# Patient Record
Sex: Female | Born: 1970 | Race: Black or African American | Hispanic: No | Marital: Single | Smoking: Former smoker
Health system: Southern US, Community
[De-identification: ages and names within clinical notes are randomized; demographics above are authoritative.]

## PROBLEM LIST (undated history)

## (undated) DIAGNOSIS — K8020 Calculus of gallbladder without cholecystitis without obstruction: Secondary | ICD-10-CM

## (undated) DIAGNOSIS — I10 Essential (primary) hypertension: Secondary | ICD-10-CM

## (undated) DIAGNOSIS — G4700 Insomnia, unspecified: Secondary | ICD-10-CM

## (undated) DIAGNOSIS — F329 Major depressive disorder, single episode, unspecified: Secondary | ICD-10-CM

## (undated) DIAGNOSIS — K589 Irritable bowel syndrome without diarrhea: Secondary | ICD-10-CM

## (undated) DIAGNOSIS — M1990 Unspecified osteoarthritis, unspecified site: Secondary | ICD-10-CM

## (undated) DIAGNOSIS — N943 Premenstrual tension syndrome: Secondary | ICD-10-CM

## (undated) DIAGNOSIS — G43909 Migraine, unspecified, not intractable, without status migrainosus: Secondary | ICD-10-CM

## (undated) DIAGNOSIS — J189 Pneumonia, unspecified organism: Secondary | ICD-10-CM

## (undated) DIAGNOSIS — Z22322 Carrier or suspected carrier of Methicillin resistant Staphylococcus aureus: Secondary | ICD-10-CM

## (undated) DIAGNOSIS — F419 Anxiety disorder, unspecified: Secondary | ICD-10-CM

## (undated) DIAGNOSIS — K219 Gastro-esophageal reflux disease without esophagitis: Secondary | ICD-10-CM

## (undated) DIAGNOSIS — E559 Vitamin D deficiency, unspecified: Secondary | ICD-10-CM

## (undated) DIAGNOSIS — A6009 Herpesviral infection of other urogenital tract: Secondary | ICD-10-CM

## (undated) DIAGNOSIS — T7840XA Allergy, unspecified, initial encounter: Secondary | ICD-10-CM

## (undated) DIAGNOSIS — D219 Benign neoplasm of connective and other soft tissue, unspecified: Secondary | ICD-10-CM

## (undated) DIAGNOSIS — E010 Iodine-deficiency related diffuse (endemic) goiter: Secondary | ICD-10-CM

## (undated) DIAGNOSIS — R0602 Shortness of breath: Secondary | ICD-10-CM

## (undated) DIAGNOSIS — D649 Anemia, unspecified: Secondary | ICD-10-CM

## (undated) DIAGNOSIS — T8859XA Other complications of anesthesia, initial encounter: Secondary | ICD-10-CM

## (undated) DIAGNOSIS — Z8619 Personal history of other infectious and parasitic diseases: Secondary | ICD-10-CM

## (undated) DIAGNOSIS — D869 Sarcoidosis, unspecified: Secondary | ICD-10-CM

## (undated) DIAGNOSIS — F32A Depression, unspecified: Secondary | ICD-10-CM

## (undated) DIAGNOSIS — M25561 Pain in right knee: Secondary | ICD-10-CM

## (undated) DIAGNOSIS — I209 Angina pectoris, unspecified: Secondary | ICD-10-CM

## (undated) DIAGNOSIS — M25562 Pain in left knee: Secondary | ICD-10-CM

## (undated) DIAGNOSIS — T4145XA Adverse effect of unspecified anesthetic, initial encounter: Secondary | ICD-10-CM

## (undated) HISTORY — DX: Migraine, unspecified, not intractable, without status migrainosus: G43.909

## (undated) HISTORY — DX: Herpesviral infection of other urogenital tract: A60.09

## (undated) HISTORY — DX: Personal history of other infectious and parasitic diseases: Z86.19

## (undated) HISTORY — DX: Insomnia, unspecified: G47.00

## (undated) HISTORY — DX: Carrier or suspected carrier of methicillin resistant Staphylococcus aureus: Z22.322

## (undated) HISTORY — DX: Iodine-deficiency related diffuse (endemic) goiter: E01.0

## (undated) HISTORY — DX: Irritable bowel syndrome, unspecified: K58.9

## (undated) HISTORY — DX: Benign neoplasm of connective and other soft tissue, unspecified: D21.9

## (undated) HISTORY — DX: Allergy, unspecified, initial encounter: T78.40XA

## (undated) HISTORY — PX: IUD REMOVAL: SHX5392

## (undated) HISTORY — DX: Anemia, unspecified: D64.9

## (undated) HISTORY — DX: Vitamin D deficiency, unspecified: E55.9

## (undated) HISTORY — PX: DIAGNOSTIC LAPAROSCOPY: SUR761

## (undated) HISTORY — DX: Premenstrual tension syndrome: N94.3

## (undated) HISTORY — PX: BLADDER SURGERY: SHX569

## (undated) HISTORY — DX: Anxiety disorder, unspecified: F41.9

## (undated) HISTORY — DX: Gastro-esophageal reflux disease without esophagitis: K21.9

## (undated) HISTORY — PX: WISDOM TOOTH EXTRACTION: SHX21

---

## 1999-01-10 ENCOUNTER — Other Ambulatory Visit: Admission: RE | Admit: 1999-01-10 | Discharge: 1999-01-10 | Payer: Self-pay | Admitting: Family Medicine

## 1999-11-12 ENCOUNTER — Emergency Department (HOSPITAL_COMMUNITY): Admission: EM | Admit: 1999-11-12 | Discharge: 1999-11-12 | Payer: Self-pay | Admitting: Emergency Medicine

## 1999-11-28 ENCOUNTER — Other Ambulatory Visit: Admission: RE | Admit: 1999-11-28 | Discharge: 1999-11-28 | Payer: Self-pay | Admitting: *Deleted

## 2000-11-27 ENCOUNTER — Other Ambulatory Visit: Admission: RE | Admit: 2000-11-27 | Discharge: 2000-11-27 | Payer: Self-pay | Admitting: Obstetrics and Gynecology

## 2001-07-05 ENCOUNTER — Encounter: Payer: Self-pay | Admitting: Gastroenterology

## 2001-07-05 ENCOUNTER — Encounter: Admission: RE | Admit: 2001-07-05 | Discharge: 2001-07-05 | Payer: Self-pay | Admitting: Gastroenterology

## 2001-10-01 ENCOUNTER — Emergency Department (HOSPITAL_COMMUNITY): Admission: EM | Admit: 2001-10-01 | Discharge: 2001-10-01 | Payer: Self-pay | Admitting: Emergency Medicine

## 2001-12-27 ENCOUNTER — Other Ambulatory Visit: Admission: RE | Admit: 2001-12-27 | Discharge: 2001-12-27 | Payer: Self-pay | Admitting: Obstetrics and Gynecology

## 2002-12-30 ENCOUNTER — Other Ambulatory Visit: Admission: RE | Admit: 2002-12-30 | Discharge: 2002-12-30 | Payer: Self-pay | Admitting: Obstetrics and Gynecology

## 2004-01-02 ENCOUNTER — Other Ambulatory Visit: Admission: RE | Admit: 2004-01-02 | Discharge: 2004-01-02 | Payer: Self-pay | Admitting: Obstetrics and Gynecology

## 2005-01-01 ENCOUNTER — Other Ambulatory Visit: Admission: RE | Admit: 2005-01-01 | Discharge: 2005-01-01 | Payer: Self-pay | Admitting: Obstetrics and Gynecology

## 2005-10-15 ENCOUNTER — Ambulatory Visit (HOSPITAL_COMMUNITY): Admission: RE | Admit: 2005-10-15 | Discharge: 2005-10-15 | Payer: Self-pay | Admitting: *Deleted

## 2006-01-01 ENCOUNTER — Other Ambulatory Visit: Admission: RE | Admit: 2006-01-01 | Discharge: 2006-01-01 | Payer: Self-pay | Admitting: Obstetrics and Gynecology

## 2006-07-28 ENCOUNTER — Emergency Department (HOSPITAL_COMMUNITY): Admission: EM | Admit: 2006-07-28 | Discharge: 2006-07-28 | Payer: Self-pay | Admitting: Emergency Medicine

## 2007-02-03 ENCOUNTER — Encounter: Admission: RE | Admit: 2007-02-03 | Discharge: 2007-02-03 | Payer: Self-pay | Admitting: Gastroenterology

## 2007-07-01 ENCOUNTER — Encounter: Admission: RE | Admit: 2007-07-01 | Discharge: 2007-07-01 | Payer: Self-pay | Admitting: Family Medicine

## 2010-12-06 NOTE — Op Note (Signed)
Yvonne Banks, Yvonne Banks             ACCOUNT NO.:  [id number]   MEDICAL RECORD NO.:  [id number]          PATIENT TYPE:  AMB   LOCATION:  ENDO                         FACILITY:  [facility]   PHYSICIAN:  Georgiana Spinner, M.D.    DATE OF BIRTH:  [birth year]   DATE OF PROCEDURE:  10/15/2005  DATE OF DISCHARGE:                                 OPERATIVE REPORT   PROCEDURES:  Upper endoscopy.   ENDOSCOPIST:  Georgiana Spinner, M.D.   INDICATIONS:  Gastroesophageal reflux disease.   ANESTHESIA:  Fentanyl 100 mcg, Versed 9 mg.   PROCEDURE IN DETAIL:  With the patient mildly sedated in the left lateral  decubitus position, the Olympus videoscopic endoscope was inserted in the  mouth and passed under direct vision into the esophagus which appeared  normal.  There was no evidence of Barrett's esophagus.  We entered into the  stomach, fundus, body, antrum, duodenal bulb, and second portion of the  duodenum were visualized.  From this point, the endoscope was slowly  withdrawn taking circumferential views of the duodenal mucosa until the  endoscope had been pulled back into the stomach and placed in retroflexion  to view the stomach from below.  The endoscope was straightened and  withdrawn taking circumferential views of the remaining gastric and  esophageal mucosa.  The patient's vital signs and pulse oximetry remained  stable.  The patient tolerated procedure well without apparent  complications.   FINDINGS:  Unremarkable examination.   PLAN:  Have the patient follow-up with me as needed.           ______________________________  Georgiana Spinner, M.D.     GMO/MEDQ  D:  10/15/2005  T:  10/16/2005  Job:  [id number]   cc:   Jethro Bastos, M.D.  Fax: [phone]

## 2011-03-20 ENCOUNTER — Other Ambulatory Visit: Payer: Self-pay | Admitting: Obstetrics and Gynecology

## 2011-03-20 DIAGNOSIS — N644 Mastodynia: Secondary | ICD-10-CM

## 2011-03-26 ENCOUNTER — Ambulatory Visit
Admission: RE | Admit: 2011-03-26 | Discharge: 2011-03-26 | Disposition: A | Discharge status: Home / Self Care | Payer: 59 | Source: Ambulatory Visit | Attending: Obstetrics and Gynecology | Admitting: Obstetrics and Gynecology

## 2011-03-26 DIAGNOSIS — N644 Mastodynia: Secondary | ICD-10-CM

## 2012-01-27 ENCOUNTER — Other Ambulatory Visit: Payer: Self-pay | Admitting: Family Medicine

## 2012-01-27 ENCOUNTER — Ambulatory Visit
Admission: RE | Admit: 2012-01-27 | Discharge: 2012-01-27 | Disposition: A | Discharge status: Home / Self Care | Payer: 59 | Source: Ambulatory Visit | Attending: Family Medicine | Admitting: Family Medicine

## 2012-01-27 DIAGNOSIS — J45909 Unspecified asthma, uncomplicated: Secondary | ICD-10-CM

## 2012-03-11 ENCOUNTER — Institutional Professional Consult (permissible substitution): Payer: 59 | Admitting: Emergency Medicine

## 2012-03-12 ENCOUNTER — Telehealth: Payer: Self-pay | Admitting: Obstetrics and Gynecology

## 2012-03-12 NOTE — Telephone Encounter (Signed)
Tc to pt regarding faxed rf request for pt's bcp.  Pt states to disregard because she gets her rx's from her family doctor and she will be getting her AEXs from her family doctor as well.

## 2012-06-15 ENCOUNTER — Other Ambulatory Visit: Payer: Self-pay | Admitting: Family Medicine

## 2012-06-15 ENCOUNTER — Other Ambulatory Visit (HOSPITAL_COMMUNITY)
Admission: RE | Admit: 2012-06-15 | Discharge: 2012-06-15 | Disposition: A | Discharge status: Home / Self Care | Payer: 59 | Source: Ambulatory Visit | Attending: Family Medicine | Admitting: Family Medicine

## 2012-06-15 DIAGNOSIS — Z124 Encounter for screening for malignant neoplasm of cervix: Secondary | ICD-10-CM | POA: Insufficient documentation

## 2012-06-15 DIAGNOSIS — Z1151 Encounter for screening for human papillomavirus (HPV): Secondary | ICD-10-CM | POA: Insufficient documentation

## 2012-06-28 ENCOUNTER — Ambulatory Visit (INDEPENDENT_AMBULATORY_CARE_PROVIDER_SITE_OTHER): Payer: 59 | Admitting: Infectious Diseases

## 2012-06-28 ENCOUNTER — Encounter: Payer: Self-pay | Admitting: Infectious Diseases

## 2012-06-28 VITALS — Temp 98.4°F | Ht 65.0 in | Wt 242.0 lb

## 2012-06-28 DIAGNOSIS — A4902 Methicillin resistant Staphylococcus aureus infection, unspecified site: Secondary | ICD-10-CM | POA: Insufficient documentation

## 2012-06-28 DIAGNOSIS — B009 Herpesviral infection, unspecified: Secondary | ICD-10-CM

## 2012-06-28 DIAGNOSIS — A609 Anogenital herpesviral infection, unspecified: Secondary | ICD-10-CM | POA: Insufficient documentation

## 2012-06-28 LAB — CBC WITH DIFFERENTIAL/PLATELET
Basophils Relative: 0 % (ref 0–1)
Eosinophils Absolute: 0.1 10*3/uL (ref 0.0–0.7)
HCT: 35.8 % — ABNORMAL LOW (ref 36.0–46.0)
Hemoglobin: 11.5 g/dL — ABNORMAL LOW (ref 12.0–15.0)
MCH: 25.1 pg — ABNORMAL LOW (ref 26.0–34.0)
MCHC: 32.1 g/dL (ref 30.0–36.0)
Monocytes Absolute: 0.4 10*3/uL (ref 0.1–1.0)
Monocytes Relative: 5 % (ref 3–12)

## 2012-06-28 MED ORDER — DOXYCYCLINE HYCLATE 100 MG PO CAPS: 100.0000 mg | ORAL_CAPSULE | Freq: Two times a day (BID) | ORAL | Status: DC

## 2012-06-28 NOTE — Assessment & Plan Note (Addendum)
She is doing well. Spoke with her at length re: keeping wounds clean and covered (to be non-contagious), wash clothes in hot water, hand washing when at work/around family, do not share towels or linens. Ok for her to shave  I will continue her doxy for an additional week. Have asked her to use the CHG only weekly and to use the mupirocin for the first 5 days of the month only.  Will see her back in 4-6 weeks. If she continues to have problems could continue suppressive anbx?

## 2012-06-28 NOTE — Progress Notes (Signed)
  Subjective:    Patient ID: Yvonne Banks, female    DOB: Feb 12, 1971, 41 y.o.   MRN: [id number]  HPI 41 yo F with hx of IBS, depression, and genital herpes. Was seen 11-26, 9-6 and 8-5 with MRSA episodes. Cx done 8-7 showed MRSA (s- clinda, gent, tet, bactrim, rif, vanco).  Had her first episode after having gone on a cruise in June. First episode was on vaginal wall. Took 10 days of bactrim and next episode started almost immediately. This was an axillary boil. Currently is on doxy (started on 11-21). Has knots in her groin area.  Is using mupirocin- every night for last several months. Does CHG bath daily for last 3 months as well.   Review of Systems  Constitutional: Positive for fever and fatigue. Negative for chills, appetite change and unexpected weight change (lost wt, was on wt watchers).  Gastrointestinal: Positive for diarrhea. Negative for constipation.  Genitourinary: Negative for vaginal discharge and difficulty urinating.       Objective:   Physical Exam  Constitutional: She appears well-developed and well-nourished.  HENT:  Mouth/Throat: No oropharyngeal exudate.  Eyes: EOM are normal. Pupils are equal, round, and reactive to light.  Neck: Neck supple.  Cardiovascular: Normal rate and regular rhythm.   Pulmonary/Chest: Effort normal and breath sounds normal.  Abdominal: Soft. Bowel sounds are normal. There is no tenderness. There is no rebound.  Genitourinary:     Lymphadenopathy:    She has no cervical adenopathy.          Assessment & Plan:

## 2012-06-28 NOTE — Assessment & Plan Note (Signed)
Advised her to continue taking ACV prn. Could switch to valtrex (mostly for convenience). She gets yearly HIV test, has not had this year, will get today.

## 2012-06-28 NOTE — Addendum Note (Signed)
Addended by: Andree Coss on: 06/28/2012 11:59 AM   Modules accepted: Orders

## 2012-08-23 ENCOUNTER — Ambulatory Visit: Payer: 59 | Admitting: Infectious Diseases

## 2012-08-31 ENCOUNTER — Telehealth: Payer: Self-pay | Admitting: Licensed Clinical Social Worker

## 2012-08-31 NOTE — Telephone Encounter (Signed)
Ok to fill, probably needs f/u appt thanks

## 2012-08-31 NOTE — Telephone Encounter (Signed)
Patient finished a month of Doxycycline at the end of December, was fine the whole month of January and now has a new lesion in her vaginal area. The patient states there is a refill of doxcycline at her pharmacy should she pick that up or make an appointment?

## 2012-08-31 NOTE — Telephone Encounter (Signed)
Patient notified.     Thank you!

## 2012-09-03 ENCOUNTER — Encounter: Payer: Self-pay | Admitting: Obstetrics and Gynecology

## 2012-09-04 ENCOUNTER — Other Ambulatory Visit: Payer: Self-pay

## 2012-09-06 ENCOUNTER — Ambulatory Visit: Payer: 59 | Admitting: Obstetrics and Gynecology

## 2012-09-06 ENCOUNTER — Encounter: Payer: Self-pay | Admitting: Obstetrics and Gynecology

## 2012-09-06 VITALS — BP 120/78 | Ht 65.0 in | Wt 248.0 lb

## 2012-09-06 DIAGNOSIS — D219 Benign neoplasm of connective and other soft tissue, unspecified: Secondary | ICD-10-CM

## 2012-09-06 DIAGNOSIS — N926 Irregular menstruation, unspecified: Secondary | ICD-10-CM

## 2012-09-06 DIAGNOSIS — Z3009 Encounter for other general counseling and advice on contraception: Secondary | ICD-10-CM

## 2012-09-06 DIAGNOSIS — G43909 Migraine, unspecified, not intractable, without status migrainosus: Secondary | ICD-10-CM

## 2012-09-06 MED ORDER — MEDROXYPROGESTERONE ACETATE 150 MG/ML IM SUSP
150.0000 mg | Freq: Once | INTRAMUSCULAR | Status: AC
  Administered 2012-09-06: 150 mg via INTRAMUSCULAR

## 2012-09-06 NOTE — Progress Notes (Signed)
When did bleeding start: 05/2011 How  Long: 3 months How often changing pad/tampon: twice a day Bleeding Disorders: no Cramping: yes Contraception: yes Fibroids: no Hormone Therapy: no New Medications: yes Menopausal Symptoms: no Vag. Discharge: no Abdominal Pain: yes Increased Stress: yes  Pt states stopped depo provera due to prolonged bleeding

## 2012-09-06 NOTE — Progress Notes (Signed)
41 YO previously on Depo Provera complains of irregular bleeding.  Last injection was November 2013.  Has been bleeding since the end of that month with tampon change twice a day, headaches, nausea and cramps.  Has "sweats" that are more profuse than in the past.and has episodes of feeling flushed with nausea that is relieved with exposure to cold-occurs several times a month and will resolve in about 30 minutes.   Seemingly occurs around mid-afternoon. Denies any bowel/bladder symptoms, vomiting, fever,  but admits to being lightheaded sometimes.  Consumes very little liquids during the day.and has considerable carbs.  Has a history of anemia (on Integra),  vitamin D deficiency (taking 2000 IU daiily), on chronic antibiotics for chronic MRSA   O: Abdomen: soft, non-tender        Pelvic: EGBUS- wnl, vagina-normal, cervix-pin-point sized polyp-cauterized with silver nitrate, no tenderness, uterus-appears ULNS, tender (patient com-       plained of cramping after exam), adnexae-no tenderness or masses  UPT-negative   A: Irregular Bleeding     Pelvic Pain     H/O Fibroids   P:  Increase liquids and protein       Pelvic Ultrasound to evaluation of fibroids       ROI-recent labs from Dr. Paulino Rily       Depo Provera 150 mg IM       RTO-as scheduled or prn        Rashena Dowling, PA-C

## 2012-09-07 DIAGNOSIS — G43909 Migraine, unspecified, not intractable, without status migrainosus: Secondary | ICD-10-CM | POA: Insufficient documentation

## 2012-09-15 ENCOUNTER — Ambulatory Visit: Payer: 59

## 2012-09-15 ENCOUNTER — Ambulatory Visit: Payer: 59 | Admitting: Obstetrics and Gynecology

## 2012-09-15 ENCOUNTER — Encounter: Payer: Self-pay | Admitting: Obstetrics and Gynecology

## 2012-09-15 VITALS — BP 116/70 | HR 74 | Wt 246.0 lb

## 2012-09-15 DIAGNOSIS — N926 Irregular menstruation, unspecified: Secondary | ICD-10-CM

## 2012-09-15 DIAGNOSIS — N946 Dysmenorrhea, unspecified: Secondary | ICD-10-CM

## 2012-09-15 DIAGNOSIS — D219 Benign neoplasm of connective and other soft tissue, unspecified: Secondary | ICD-10-CM

## 2012-09-15 MED ORDER — KETOROLAC TROMETHAMINE 10 MG PO TABS: 10.0000 mg | ORAL_TABLET | Freq: Four times a day (QID) | ORAL | Status: DC | PRN

## 2012-09-15 NOTE — Progress Notes (Signed)
41 YO seen previously for irregular bleeding and moderately severe cramping that is not relieved with OTC analgesia, returns for ultrasound follow up.  Patient knows of a history of fibroids and is on Depo Provera but bleeding is out of proportion to her usual bleeding.    O: U/S: uterus-8.51 x 8.72 x 7.10 cm, endometrium-0.632 cm, right ovary-2.84 x 2.40 x 1.92 cm and left ovary-2.79 x 1.90 x 1.36 cm; Left anterior intramural fibroid: 3.1 x 2.7 x 2.3 cm and Mildline fibroid ? submucosal 3.4 x 2.7 x 3.1 cm seen in 3D imaging; uterus has diffuse fibroid involvement.  A:: Uterine Fibroids      Irregular Bleeding      Severe Dysmenorrhea  P: Revewed management options:  BCPs, estradiol while on Depo Provera, hysteroscopic removal of fibroid, Mirena IUD (patient wants to preserve     childbearing potential       Schedule with Dr. Su Hilt for possible hysteroscopic removal of submucosal fibroid      Reviewed SHG as a possible pre-surgery requirement      Toradol 10 mg #20 1 po pc q 6 hours prn-pain      RTO-as scheduled or prn  Keylen Uzelac,PA-C

## 2012-09-15 NOTE — Patient Instructions (Addendum)
Fibroids Fibroids are lumps (tumors) that can occur any place in a woman's body. These lumps are not cancerous. Fibroids vary in size, weight, and where they grow. HOME CARE  Do not take aspirin.  Write down the number of pads or tampons you use during your period. Tell your doctor. This can help determine the best treatment for you. GET HELP RIGHT AWAY IF:  You have pain in your lower belly (abdomen) that is not helped with medicine.  You have cramps that are not helped with medicine.  You have more bleeding between or during your period.  You feel lightheaded or pass out (faint).  Your lower belly pain gets worse. MAKE SURE YOU:  Understand these instructions.  Will watch your condition.  Will get help right away if you are not doing well or get worse. Document Released: 08/09/2010 Document Revised: 09/29/2011 Document Reviewed: 08/09/2010 [facility] Patient Information 2013 [place], [place].   Hysteroscopy Hysteroscopy is a procedure used for looking inside the womb (uterus). It may be done for many different reasons, including:  To evaluate abnormal bleeding, fibroid (benign, noncancerous) tumors, polyps, scar tissue (adhesions), and possibly cancer of the uterus.  To look for lumps (tumors) and other uterine growths.  To look for causes of why a woman cannot get pregnant (infertility), causes of recurrent loss of pregnancy (miscarriages), or a lost intrauterine device (IUD).  To perform a sterilization by blocking the fallopian tubes from inside the uterus. A hysteroscopy should be done right after a menstrual period to be sure you are not pregnant. LET YOUR CAREGIVER KNOW ABOUT:   Allergies.  Medicines taken, including herbs, eyedrops, over-the-counter medicines, and creams.  Use of steroids (by mouth or creams).  Previous problems with anesthetics or numbing medicines.  History of bleeding or blood problems.  History of blood clots.  Possibility of pregnancy,  if this applies.  Previous surgery.  Other health problems. RISKS AND COMPLICATIONS   Putting a hole in the uterus.  Excessive bleeding.  Infection.  Damage to the cervix.  Injury to other organs.  Allergic reaction to medicines.  Too much fluid used in the uterus for the procedure. BEFORE THE PROCEDURE   Do not take aspirin or blood thinners for a week before the procedure, or as directed. It can cause bleeding.  Arrive at least 60 minutes before the procedure or as directed to read and sign the necessary forms.  Arrange for someone to take you home after the procedure.  If you smoke, do not smoke for 2 weeks before the procedure. PROCEDURE   Your caregiver may give you medicine to relax you. He or she may also give you a medicine that numbs the area around the cervix (local anesthetic) or a medicine that makes you sleep (general anesthesia).  Sometimes, a medicine is placed in the cervix the day before the procedure. This medicine makes the cervix have a larger opening (dilate). This makes it easier for the instrument to be inserted into the uterus.  A small instrument (hysteroscope) is inserted through the vagina into the uterus. This instrument is similar to a pencil-sized telescope with a light.  During the procedure, air or a liquid is put into the uterus, which allows the surgeon to see better.  Sometimes, tissue is gently scraped from inside the uterus. These tissue samples are sent to a specialist who looks at tissue samples (pathologist). The pathologist will give a report to your caregiver. This will help your caregiver decide if further treatment  is necessary. The report will also help your caregiver decide on the best treatment if the test comes back abnormal. AFTER THE PROCEDURE   If you had a general anesthetic, you may be groggy for a couple hours after the procedure.  If you had a local anesthetic, you will be advised to rest at the surgical center or  caregiver's office until you are stable and feel ready to go home.  You may have some cramping for a couple days.  You may have bleeding, which varies from light spotting for a few days to menstrual-like bleeding for up to 3 to 7 days. This is normal.  Have someone take you home. FINDING OUT THE RESULTS OF YOUR TEST Not all test results are available during your visit. If your test results are not back during the visit, make an appointment with your caregiver to find out the results. Do not assume everything is normal if you have not heard from your caregiver or the medical facility. It is important for you to follow up on all of your test results. HOME CARE INSTRUCTIONS   Do not drive for 24 hours or as instructed.  Only take over-the-counter or prescription medicines for pain, discomfort, or fever as directed by your caregiver.  Do not take aspirin. It can cause or aggravate bleeding.  Do not drive or drink alcohol while taking pain medicine.  You may resume your usual diet.  Do not use tampons, douche, or have sexual intercourse for 2 weeks, or as advised by your caregiver.  Rest and sleep for the first 24 to 48 hours.  Take your temperature twice a day for 4 to 5 days. Write it down. Give these temperatures to your caregiver if they are abnormal (above 98.6 F or 37.0 C).  Take medicines your caregiver has ordered as directed.  Follow your caregiver's advice regarding diet, exercise, lifting, driving, and general activities.  Take showers instead of baths for 2 weeks, or as recommended by your caregiver.  If you develop constipation:  Take a mild laxative with the advice of your caregiver.  Eat bran foods.  Drink enough water and fluids to keep your urine clear or pale yellow.  Try to have someone with you or available to you for the first 24 to 48 hours, especially if you had a general anesthetic.  Make sure you and your family understand everything about your  operation and recovery.  Follow your caregiver's advice regarding follow-up appointments and Pap smears. SEEK MEDICAL CARE IF:   You feel dizzy or lightheaded.  You feel sick to your stomach (nauseous).  You develop abnormal vaginal discharge.  You develop a rash.  You have an abnormal reaction or allergy to your medicine.  You need stronger pain medicine. SEEK IMMEDIATE MEDICAL CARE IF:   Bleeding is heavier than a normal menstrual period or you have blood clots.  You have an oral temperature above 102 F (38.9 C), not controlled by medicine.  You have increasing cramps or pains not relieved with medicine.  You develop belly (abdominal) pain that does not seem to be related to the same area of earlier cramping and pain.  You pass out.  You develop pain in the tops of your shoulders (shoulder strap areas).  You develop shortness of breath. MAKE SURE YOU:   Understand these instructions.  Will watch your condition.  Will get help right away if you are not doing well or get worse. Document Released: 10/13/2000 Document Revised: 09/29/2011  Document Reviewed: 02/05/2009 [facility] Patient Information 2013 [place], [place]. Levonorgestrel intrauterine device (IUD) What is this medicine? LEVONORGESTREL IUD (LEE voe nor jes trel) is a contraceptive (birth control) device. It is used to prevent pregnancy and to treat heavy bleeding that occurs during your period. It can be used for up to 5 years. This medicine may be used for other purposes; ask your health care provider or pharmacist if you have questions. What should I tell my health care provider before I take this medicine? They need to know if you have any of these conditions: -abnormal Pap smear -cancer of the breast, uterus, or cervix -diabetes -endometritis -genital or pelvic infection now or in the past -have more than one sexual partner or your partner has more than one partner -heart disease -history of an  ectopic or tubal pregnancy -immune system problems -IUD in place -liver disease or tumor -problems with blood clots or take blood-thinners -use intravenous drugs -uterus of unusual shape -vaginal bleeding that has not been explained -an unusual or allergic reaction to levonorgestrel, other hormones, silicone, or polyethylene, medicines, foods, dyes, or preservatives -pregnant or trying to get pregnant -breast-feeding How should I use this medicine? This device is placed inside the uterus by a health care professional. Talk to your pediatrician regarding the use of this medicine in children. Special care may be needed. Overdosage: If you think you have taken too much of this medicine contact a poison control center or emergency room at once. NOTE: This medicine is only for you. Do not share this medicine with others. What if I miss a dose? This does not apply. What may interact with this medicine? Do not take this medicine with any of the following medications: -amprenavir -bosentan -fosamprenavir This medicine may also interact with the following medications: -aprepitant -barbiturate medicines for inducing sleep or treating seizures -bexarotene -griseofulvin -medicines to treat seizures like carbamazepine, ethotoin, felbamate, oxcarbazepine, phenytoin, topiramate -modafinil -pioglitazone -rifabutin -rifampin -rifapentine -some medicines to treat HIV infection like atazanavir, indinavir, lopinavir, nelfinavir, tipranavir, ritonavir -St. John's wort -warfarin This list may not describe all possible interactions. Give your health care provider a list of all the medicines, herbs, non-prescription drugs, or dietary supplements you use. Also tell them if you smoke, drink alcohol, or use illegal drugs. Some items may interact with your medicine. What should I watch for while using this medicine? Visit your doctor or health care professional for regular check ups. See your doctor if  you or your partner has sexual contact with others, becomes HIV positive, or gets a sexual transmitted disease. This product does not protect you against HIV infection (AIDS) or other sexually transmitted diseases. You can check the placement of the IUD yourself by reaching up to the top of your vagina with clean fingers to feel the threads. Do not pull on the threads. It is a good habit to check placement after each menstrual period. Call your doctor right away if you feel more of the IUD than just the threads or if you cannot feel the threads at all. The IUD may come out by itself. You may become pregnant if the device comes out. If you notice that the IUD has come out use a backup birth control method like condoms and call your health care provider. Using tampons will not change the position of the IUD and are okay to use during your period. What side effects may I notice from receiving this medicine? Side effects that you should report to your doctor  or health care professional as soon as possible: -allergic reactions like skin rash, itching or hives, swelling of the face, lips, or tongue -fever, flu-like symptoms -genital sores -high blood pressure -no menstrual period for 6 weeks during use -pain, swelling, warmth in the leg -pelvic pain or tenderness -severe or sudden headache -signs of pregnancy -stomach cramping -sudden shortness of breath -trouble with balance, talking, or walking -unusual vaginal bleeding, discharge -yellowing of the eyes or skin Side effects that usually do not require medical attention (report to your doctor or health care professional if they continue or are bothersome): -acne -breast pain -change in sex drive or performance -changes in weight -cramping, dizziness, or faintness while the device is being inserted -headache -irregular menstrual bleeding within first 3 to 6 months of use -nausea This list may not describe all possible side effects. Call your  doctor for medical advice about side effects. You may report side effects to FDA at 1-800-FDA-1088. Where should I keep my medicine? This does not apply. NOTE: This sheet is a summary. It may not cover all possible information. If you have questions about this medicine, talk to your doctor, pharmacist, or health care provider.  2012, [employer]. (07/28/2008 6:39:08 PM)

## 2012-09-20 ENCOUNTER — Ambulatory Visit: Payer: 59 | Admitting: Obstetrics and Gynecology

## 2012-09-20 ENCOUNTER — Encounter: Payer: Self-pay | Admitting: Obstetrics and Gynecology

## 2012-09-20 VITALS — BP 112/72 | HR 78 | Wt 248.0 lb

## 2012-09-20 DIAGNOSIS — N920 Excessive and frequent menstruation with regular cycle: Secondary | ICD-10-CM

## 2012-09-20 DIAGNOSIS — Q649 Congenital malformation of urinary system, unspecified: Secondary | ICD-10-CM

## 2012-09-20 DIAGNOSIS — N926 Irregular menstruation, unspecified: Secondary | ICD-10-CM

## 2012-09-20 DIAGNOSIS — D219 Benign neoplasm of connective and other soft tissue, unspecified: Secondary | ICD-10-CM

## 2012-09-20 LAB — TSH: TSH: 1.602 u[IU]/mL (ref 0.350–4.500)

## 2012-09-20 MED ORDER — NORETHINDRONE 0.35 MG PO TABS: 1.0000 | ORAL_TABLET | Freq: Every day | ORAL | Status: DC

## 2012-09-20 NOTE — Addendum Note (Signed)
Addended by: Osborn Coho on: 09/20/2012 04:14 PM   Modules accepted: Orders

## 2012-09-20 NOTE — Progress Notes (Addendum)
Here to f/u visit with EP.  Pt is on depo for menstrual migraines because it helped in her 40s.  It has helped with her heavy cycles (changing pad and tampon q1hr) but not with the pain.  She wants to preserve fertility. C/o urinary frequency but no pain.  Filed Vitals:   09/20/12 1505  BP: 112/72  Pulse: 78   Reviewed u/s which showed a possible submucosal fibroid.  A/P Check TSH and send UCx Pt is mildly anemic Reviewed options She just got lst depo shot - I rec stop depo bc of delayed fertility issues with it and start micronor the last week of the depo I rec hyst/D&C for eval and poss tx Also discussed myomectomy and hysterectomy when pt gets to the point of being ready for that

## 2012-09-21 LAB — URINE CULTURE: Colony Count: NO GROWTH

## 2012-09-22 ENCOUNTER — Telehealth: Payer: Self-pay | Admitting: Obstetrics and Gynecology

## 2012-09-22 NOTE — Telephone Encounter (Signed)
Spoke to pt re wnl labs. Yvonne Banks A

## 2012-09-22 NOTE — Telephone Encounter (Signed)
Ar pt 

## 2012-09-27 ENCOUNTER — Telehealth: Payer: Self-pay | Admitting: Obstetrics and Gynecology

## 2012-09-27 NOTE — Telephone Encounter (Signed)
Hysteroscopy D&C with Resection scheduled for 11/05/12 @ 9:00 with AR. [employer] effective 07/22/11. Plan pays 90/10. Pre-op due$92.80. -Adrianne Pridgen

## 2012-09-30 ENCOUNTER — Telehealth: Payer: Self-pay | Admitting: Obstetrics and Gynecology

## 2012-09-30 NOTE — Telephone Encounter (Signed)
Ar pt 

## 2012-10-01 ENCOUNTER — Telehealth: Payer: Self-pay | Admitting: Obstetrics and Gynecology

## 2012-10-01 NOTE — Telephone Encounter (Signed)
Spoke to pt to let her know that per AR, I will call in Vicodin 5/300 sig: 1-2 q 6 hrs prn # 30 with NO RF's to [facility]. Alvino Chapel

## 2012-10-01 NOTE — Telephone Encounter (Signed)
TC to pt.States Yvonne Banks has already spoken with pt issue is being resolved.

## 2012-10-05 ENCOUNTER — Telehealth: Payer: Self-pay | Admitting: *Deleted

## 2012-10-05 MED ORDER — SULFAMETHOXAZOLE-TRIMETHOPRIM 800-160 MG PO TABS: 1.0000 | ORAL_TABLET | Freq: Two times a day (BID) | ORAL | Status: DC

## 2012-10-05 NOTE — Telephone Encounter (Signed)
Patient is having another MRSA outbreak, painful boil in groin area. Had cancelled her f/u in Feb because no outbreak. At last visit in December Dr. Ninetta Lights had talked about trying a different antibiotic. Offered her an appt for next week, but she can not come due to work schedule. She requested an antibiotic called in for now. Wendall Mola

## 2012-10-05 NOTE — Telephone Encounter (Signed)
2 weeks of bactrim DS 1 bid

## 2012-10-11 ENCOUNTER — Other Ambulatory Visit: Payer: Self-pay | Admitting: Obstetrics and Gynecology

## 2012-10-18 ENCOUNTER — Encounter (HOSPITAL_COMMUNITY): Payer: Self-pay | Admitting: Obstetrics and Gynecology

## 2012-10-26 ENCOUNTER — Encounter (HOSPITAL_COMMUNITY): Payer: Self-pay | Admitting: Pharmacist

## 2012-11-03 ENCOUNTER — Other Ambulatory Visit (HOSPITAL_COMMUNITY): Payer: Self-pay | Admitting: Obstetrics and Gynecology

## 2012-11-03 NOTE — H&P (Signed)
Yvonne Banks is a  41 YO female, G0 who  presents for hysteroscopy, dilatation and curettage with resection of a submucosal fibroid because of menorrhagia.  Since November of 2013 the patient reports a 7 day menstrual flow in which she had to change a pad and a super plus tampon every 2 hours.  She passed clots, as large as her hand, and experienced cramps that did not respond to Toradol but did to  Vicodin.   Since her period in November, Yvonne Banks has had daily bleeding, with twice a day pad change, accompanied by pelvic discomfort.  Throughout this ordeal the patient has been on Depo Provera, yet the only 2 weeks she went without any bleeding was when she began  Norethindrone. After two weeks however,  the bleeding resumed. A pelvic ultrasound in February 2014 showed a uterus:8.51 x 8.72 x 7.10 cm with diffuse fibroid involvement but the largest were: left anterior-3.1 x 2.8 x 2.3 cm and a midline with possible submucosal component- 3.4 x 2.7 x 3.1 cm her  left ovary-2.79 x 1.90 x 1.36 cm and right ovary-2.84 x 2.40 x 1.92 cm. Patient admits  to  nausea, sweats, fatigue,  headaches and loose stools but denies urinary tract or vaginitis symptoms.  A review of both medical and surgical management options were given o patient however she wishes to proceed with hysteroscopy, dilatation and curettage with possible resection of submucosal fibroid.  Past Medical History  OB History: G0P0   GYN History: menarche: 42 YO;  LMP: see HPI    Contracepton Depo-Provera  The patient reports a past history of: chlamydia and herpes.  Denies history of abnormal PAP smear  Last PAP smear:  08/16/11  Medical History: GERD, anemia, PMS, migraines, Vitamin D Deficiency, MRSA and uterine fibroids.  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   Outpatient Encounter Prescriptions as of 11/03/2012  Medication Sig Dispense Refill  . acyclovir (ZOVIRAX) 400 MG tablet Take 400 mg by mouth 4 (four) times daily as needed.       . azelastine (ASTELIN) 137 MCG/SPRAY nasal spray Place 1 spray into the nose as needed. 1 spray each nostril daily      . Biotin 1 MG CAPS Take 1 mg by mouth daily. Chewables.      . Calcium 600-400 MG-UNIT CHEW Chew 1 each by mouth daily.      . cetirizine (ZYRTEC) 10 MG tablet Take 10 mg by mouth daily.      . clarithromycin (BIAXIN) 500 MG tablet Take 500 mg by mouth 2 (two) times daily.      . Fe Fum-FePoly-Vit C-Vit B3 (INTEGRA PO) Take by mouth at bedtime.       . HYDROcodone-acetaminophen (NORCO/VICODIN) 5-325 MG per tablet Take 1-2 tablets by mouth every 6 (six) hours as needed for pain.      . ibuprofen (ADVIL,MOTRIN) 200 MG tablet Take 800 mg by mouth every 6 (six) hours as needed for pain (for migraines).      . ketorolac (TORADOL) 10 MG tablet Take 1 tablet (10 mg total) by mouth every 6 (six) hours as needed for pain.  20 tablet  0  . Multiple Vitamin (MULTIVITAMIN WITH MINERALS) TABS Take 1 tablet by mouth daily. Uses chewables.      .   norethindrone (MICRONOR,CAMILA,ERRIN) 0.35 MG tablet Take 1 tablet (0.35 mg total) by mouth daily.  1 Package  11  . ranitidine (ZANTAC) 75 MG tablet Take 75 mg by mouth at bedtime as needed for heartburn.      . sertraline (ZOLOFT) 100 MG tablet 200 mg at bedtime.       . topiramate (TOPAMAX) 50 MG tablet Take 100 mg by mouth at bedtime.        No facility-administered encounter medications on file as of 11/03/2012.    Allergies  Allergen Reactions  . Erythromycin     vomiting  . Iron     Stomach pain. Pt can take Integra for iron  . Vitamin D Analogs     Stomach pains.    Denies sensitivity to peanuts, shellfish,  or soy. Admits to sensitivity to Latex and Adhesives  ROS: Admits to nausea,  loose stools,  headache, but denies vision changes, nasal congestion, dysphagia, tinnitus, dizziness, hoarseness, cough,  chest pain, shortness of breath, vomiting, constipation,  urinary frequency, urgency  dysuria, hematuria, vaginitis symptoms,  swelling of joints,easy bruising,  myalgias, arthralgias, skin rashes, unexplained weight loss and except as is mentioned in the history of present illness, patient's review of systems is otherwise negative.  Physical Exam  Bp-124/72    P 82   R 16  T  99.1 degrees F   Weight 242lbs  Height  5' 4.75 "   BMI 40.6  Neck: supple without masses or thyromegaly Lungs: clear to auscultation Heart: regular rate and rhythm Abdomen: soft, non-tender and no organomegaly Pelvic:EGBUS- wnl; vagina-normal rugae, scant blood; uterus-normal size, cervix without lesions or motion tenderness; adnexae-no tenderness or masses Extremities:  no clubbing, cyanosis or edema   Assesment:  Menorrhagia                       Fibroids   Disposition:  A discussion was held with patient regarding the indication for her procedure(s) along with the risks, which include but are not limited to: reaction to anesthesia, damage to adjacent organs, infection, excessive bleeding and endometrial scarring. The patient verblized understanding of these risks and has consented to proceed with Hysteroscopy, Dilatation, Curettage and Possible Resection of a Submucosal Fibroid at [facility] on November 05, 2012.   CSN# [id number]   Yvonne Banks J. Rusell Meneely, PA-C  for Dr. Angela Y.Roberts   

## 2012-11-05 ENCOUNTER — Ambulatory Visit (HOSPITAL_COMMUNITY)
Admission: RE | Admit: 2012-11-05 | Discharge: 2012-11-05 | Disposition: A | Discharge status: Home / Self Care | Payer: 59 | Source: Ambulatory Visit | Attending: Obstetrics and Gynecology | Admitting: Obstetrics and Gynecology

## 2012-11-05 ENCOUNTER — Encounter (HOSPITAL_COMMUNITY)
Admission: RE | Disposition: A | Discharge status: Home / Self Care | Payer: Self-pay | Source: Ambulatory Visit | Attending: Obstetrics and Gynecology

## 2012-11-05 ENCOUNTER — Ambulatory Visit (HOSPITAL_COMMUNITY): Payer: 59 | Admitting: Anesthesiology

## 2012-11-05 ENCOUNTER — Encounter (HOSPITAL_COMMUNITY): Payer: Self-pay | Admitting: Anesthesiology

## 2012-11-05 DIAGNOSIS — N920 Excessive and frequent menstruation with regular cycle: Secondary | ICD-10-CM | POA: Insufficient documentation

## 2012-11-05 DIAGNOSIS — D250 Submucous leiomyoma of uterus: Secondary | ICD-10-CM | POA: Insufficient documentation

## 2012-11-05 HISTORY — PX: DILATATION & CURRETTAGE/HYSTEROSCOPY WITH RESECTOCOPE: SHX5572

## 2012-11-05 LAB — CBC
MCH: 25.4 pg — ABNORMAL LOW (ref 26.0–34.0)
MCHC: 32.2 g/dL (ref 30.0–36.0)
Platelets: 316 10*3/uL (ref 150–400)
RDW: 16.2 % — ABNORMAL HIGH (ref 11.5–15.5)

## 2012-11-05 LAB — HCG, SERUM, QUALITATIVE: Preg, Serum: NEGATIVE

## 2012-11-05 SURGERY — DILATATION & CURETTAGE/HYSTEROSCOPY WITH RESECTOCOPE
Anesthesia: General | Site: Uterus | Wound class: Clean Contaminated

## 2012-11-05 MED ORDER — LIDOCAINE HCL (CARDIAC) 20 MG/ML IV SOLN
INTRAVENOUS | Status: AC
  Filled 2012-11-05: qty 5

## 2012-11-05 MED ORDER — ONDANSETRON HCL 4 MG/2ML IJ SOLN
INTRAMUSCULAR | Status: DC | PRN
  Administered 2012-11-05: 4 mg via INTRAVENOUS

## 2012-11-05 MED ORDER — IBUPROFEN 200 MG PO TABS: 800.0000 mg | ORAL_TABLET | Freq: Three times a day (TID) | ORAL | Status: DC | PRN

## 2012-11-05 MED ORDER — MIDAZOLAM HCL 5 MG/5ML IJ SOLN
INTRAMUSCULAR | Status: DC | PRN
  Administered 2012-11-05: 2 mg via INTRAVENOUS

## 2012-11-05 MED ORDER — LIDOCAINE HCL 1 % IJ SOLN
INTRAMUSCULAR | Status: DC | PRN
  Administered 2012-11-05: 20 mL

## 2012-11-05 MED ORDER — FENTANYL CITRATE 0.05 MG/ML IJ SOLN
INTRAMUSCULAR | Status: DC | PRN
  Administered 2012-11-05: 50 ug via INTRAVENOUS
  Administered 2012-11-05 (×2): 25 ug via INTRAVENOUS

## 2012-11-05 MED ORDER — LIDOCAINE HCL (CARDIAC) 20 MG/ML IV SOLN
INTRAVENOUS | Status: DC | PRN
  Administered 2012-11-05: 60 mg via INTRAVENOUS

## 2012-11-05 MED ORDER — FENTANYL CITRATE 0.05 MG/ML IJ SOLN
INTRAMUSCULAR | Status: AC
  Administered 2012-11-05: 50 ug via INTRAVENOUS
  Filled 2012-11-05: qty 2

## 2012-11-05 MED ORDER — LACTATED RINGERS IV SOLN
INTRAVENOUS | Status: DC
  Administered 2012-11-05 (×2): via INTRAVENOUS

## 2012-11-05 MED ORDER — KETOROLAC TROMETHAMINE 30 MG/ML IJ SOLN
INTRAMUSCULAR | Status: DC | PRN
  Administered 2012-11-05: 30 mg via INTRAVENOUS

## 2012-11-05 MED ORDER — MIDAZOLAM HCL 2 MG/2ML IJ SOLN
INTRAMUSCULAR | Status: AC
  Filled 2012-11-05: qty 2

## 2012-11-05 MED ORDER — DEXAMETHASONE SODIUM PHOSPHATE 10 MG/ML IJ SOLN
INTRAMUSCULAR | Status: AC
  Filled 2012-11-05: qty 1

## 2012-11-05 MED ORDER — PROPOFOL 10 MG/ML IV EMUL
INTRAVENOUS | Status: AC
  Filled 2012-11-05: qty 20

## 2012-11-05 MED ORDER — FENTANYL CITRATE 0.05 MG/ML IJ SOLN
INTRAMUSCULAR | Status: AC
  Filled 2012-11-05: qty 2

## 2012-11-05 MED ORDER — FENTANYL CITRATE 0.05 MG/ML IJ SOLN
25.0000 ug | INTRAMUSCULAR | Status: DC | PRN
  Administered 2012-11-05: 50 ug via INTRAVENOUS

## 2012-11-05 MED ORDER — FAMOTIDINE 20 MG PO TABS
ORAL_TABLET | ORAL | Status: AC
  Administered 2012-11-05: 20 mg via ORAL
  Filled 2012-11-05: qty 1

## 2012-11-05 MED ORDER — PROPOFOL 10 MG/ML IV BOLUS
INTRAVENOUS | Status: DC | PRN
  Administered 2012-11-05: 200 mg via INTRAVENOUS

## 2012-11-05 MED ORDER — DEXAMETHASONE SODIUM PHOSPHATE 10 MG/ML IJ SOLN
INTRAMUSCULAR | Status: DC | PRN
  Administered 2012-11-05: 10 mg via INTRAVENOUS

## 2012-11-05 MED ORDER — GLYCINE 1.5 % IR SOLN
Status: DC | PRN
  Administered 2012-11-05: 3000 mL

## 2012-11-05 MED ORDER — DEXTROSE 5 % IV SOLN
550.0000 mg | Freq: Once | INTRAVENOUS | Status: AC
  Administered 2012-11-05: 550 mg via INTRAVENOUS
  Filled 2012-11-05: qty 11

## 2012-11-05 MED ORDER — HYDROCODONE-ACETAMINOPHEN 5-325 MG PO TABS
ORAL_TABLET | ORAL | Status: AC
  Filled 2012-11-05: qty 1

## 2012-11-05 MED ORDER — KETOROLAC TROMETHAMINE 30 MG/ML IJ SOLN
INTRAMUSCULAR | Status: AC
  Filled 2012-11-05: qty 1

## 2012-11-05 MED ORDER — FAMOTIDINE 20 MG PO TABS: 20.0000 mg | ORAL_TABLET | Freq: Once | ORAL | Status: AC

## 2012-11-05 MED ORDER — HYDROCODONE-ACETAMINOPHEN 5-325 MG PO TABS
1.0000 | ORAL_TABLET | Freq: Once | ORAL | Status: AC
  Administered 2012-11-05: 1 via ORAL

## 2012-11-05 MED ORDER — HYDROCODONE-ACETAMINOPHEN 5-325 MG PO TABS: 1.0000 | ORAL_TABLET | Freq: Four times a day (QID) | ORAL | Status: DC | PRN

## 2012-11-05 MED ORDER — ONDANSETRON HCL 4 MG/2ML IJ SOLN
INTRAMUSCULAR | Status: AC
  Filled 2012-11-05: qty 2

## 2012-11-05 SURGICAL SUPPLY — 19 items
CANISTER SUCTION 2500CC (MISCELLANEOUS) ×2 IMPLANT
CATH ROBINSON RED A/P 16FR (CATHETERS) ×2 IMPLANT
CLOTH BEACON ORANGE TIMEOUT ST (SAFETY) ×2 IMPLANT
CONTAINER PREFILL 10% NBF 60ML (FORM) ×3 IMPLANT
DRESSING TELFA 8X3 (GAUZE/BANDAGES/DRESSINGS) ×2 IMPLANT
ELECT REM PT RETURN 9FT ADLT (ELECTROSURGICAL) ×2
ELECTRODE REM PT RTRN 9FT ADLT (ELECTROSURGICAL) ×1 IMPLANT
GLOVE BIO SURGEON STRL SZ7.5 (GLOVE) ×3 IMPLANT
GLOVE BIOGEL PI IND STRL 7.0 (GLOVE) IMPLANT
GLOVE BIOGEL PI IND STRL 7.5 (GLOVE) ×1 IMPLANT
GLOVE BIOGEL PI INDICATOR 7.0 (GLOVE) ×1
GLOVE BIOGEL PI INDICATOR 7.5 (GLOVE) ×2
GLOVE NEODERM STER SZ 7 (GLOVE) ×2 IMPLANT
GLOVE SURG SS PI 7.0 STRL IVOR (GLOVE) ×1 IMPLANT
GOWN STRL REIN XL XLG (GOWN DISPOSABLE) ×5 IMPLANT
LOOP ANGLED CUTTING 22FR (CUTTING LOOP) ×1 IMPLANT
PACK HYSTEROSCOPY LF (CUSTOM PROCEDURE TRAY) ×2 IMPLANT
PAD OB MATERNITY 4.3X12.25 (PERSONAL CARE ITEMS) ×2 IMPLANT
TOWEL OR 17X24 6PK STRL BLUE (TOWEL DISPOSABLE) ×5 IMPLANT

## 2012-11-05 NOTE — Anesthesia Preprocedure Evaluation (Signed)
Anesthesia Evaluation  Patient identified by MRN, date of birth, ID band Patient awake    Reviewed: Allergy & Precautions, H&P , Patient's Chart, lab work & pertinent test results, reviewed documented beta blocker date and time   Airway Mallampati: II TM Distance: >3 FB Neck ROM: full    Dental no notable dental hx.    Pulmonary  breath sounds clear to auscultation  Pulmonary exam normal       Cardiovascular Rhythm:regular Rate:Normal     Neuro/Psych    GI/Hepatic GERD-  Medicated,  Endo/Other  Morbid obesity  Renal/GU      Musculoskeletal   Abdominal   Peds  Hematology   Anesthesia Other Findings   Reproductive/Obstetrics                           Anesthesia Physical Anesthesia Plan  ASA: III  Anesthesia Plan: General   Post-op Pain Management:    Induction: Intravenous  Airway Management Planned: LMA  Additional Equipment:   Intra-op Plan:   Post-operative Plan:   Informed Consent: I have reviewed the patients History and Physical, chart, labs and discussed the procedure including the risks, benefits and alternatives for the proposed anesthesia with the patient or authorized representative who has indicated his/her understanding and acceptance.   Dental Advisory Given  Plan Discussed with: CRNA and Surgeon  Anesthesia Plan Comments: (  Discussed  general anesthesia, including possible nausea, instrumentation of airway, sore throat,pulmonary aspiration, etc. I asked if the were any outstanding questions, or  concerns before we proceeded. )        Anesthesia Quick Evaluation

## 2012-11-05 NOTE — Op Note (Signed)
Preop Diagnosis: Menorrhagia and Fibroids   Postop Diagnosis: Menorrhagia and Fibroids   Procedure: DILATATION & CURETTAGE/HYSTEROSCOPY WITH RESECTOCOPE   Anesthesia: General   Anesthesiologist: Cristela Blue, MD  Attending: Purcell Nails, MD   Assistant: N/a  Findings: Large submucosal fibroid encompassing almost the entire cavity  Pathology: Endometrial Curettings  Fluids: 700cc  Hysteroscopic Fluid Deficit: 650cc of Glycine  UOP: 50cc via straight cath prior to procedure  EBL: Minimal  Complications: None  Procedure: The patient was taken to the operating room after the risks, benefits and alternatives were discussed with the patient. The patient verbalized understanding and consent signed and witnessed. The patient was placed under general anesthesia with an LMA per anesthesiologist and prepped and draped in the normal sterile fashion.  Time Out was performed per protocol.  A bivalve speculum was placed in the patient's vagina and the anterior lip of the cervix was grasped with a single tooth tenaculum. A paracervical block was administered using a total of 10 cc of 1% lidocaine. The uterus sounded to 8 cm. The cervix was dilated for passage of the hysteroscope.  The hysteroscope was introduced into the uterine cavity and findings as noted above. Sharp curettage was performed until a gritty texture was noted and curettings were sent to pathology.  A resectoscope was reintroduced and a large submucosal fibroid was noted that encompassed almost the entire cavity.  The fluid deficit was too great at this point to safely continue with resection.  All instruments were removed. Sponge lap and needle count was correct. The patient tolerated the procedure well and was returned to the recovery room in good condition.  Note: will review options at postop appt - pt is a good candidate for hysteroscopic morcellation of fibroid

## 2012-11-05 NOTE — Anesthesia Postprocedure Evaluation (Signed)
  Anesthesia Post-op Note  Patient: Yvonne Banks  Procedure(s) Performed: Procedure(s): DILATATION & CURETTAGE/HYSTEROSCOPY WITH RESECTOCOPE (N/A)  Patient Location: PACU  Anesthesia Type:General  Level of Consciousness: awake, alert  and oriented  Airway and Oxygen Therapy: Patient Spontanous Breathing  Post-op Pain: none  Post-op Assessment: Post-op Vital signs reviewed, Patient's Cardiovascular Status Stable, Respiratory Function Stable, Patent Airway, No signs of Nausea or vomiting and Pain level controlled  Post-op Vital Signs: Reviewed and stable  Complications: No apparent anesthesia complications

## 2012-11-05 NOTE — H&P (View-Only) (Signed)
Yvonne Banks is a  41 YO female, G0 who  presents for hysteroscopy, dilatation and curettage with resection of a submucosal fibroid because of menorrhagia.  Since November of 2013 the patient reports a 7 day menstrual flow in which she had to change a pad and a super plus tampon every 2 hours.  She passed clots, as large as her hand, and experienced cramps that did not respond to Toradol but did to  Vicodin.   Since her period in November, Yvonne Banks has had daily bleeding, with twice a day pad change, accompanied by pelvic discomfort.  Throughout this ordeal the patient has been on Depo Provera, yet the only 2 weeks she went without any bleeding was when she began  Norethindrone. After two weeks however,  the bleeding resumed. A pelvic ultrasound in February 2014 showed a uterus:8.51 x 8.72 x 7.10 cm with diffuse fibroid involvement but the largest were: left anterior-3.1 x 2.8 x 2.3 cm and a midline with possible submucosal component- 3.4 x 2.7 x 3.1 cm her  left ovary-2.79 x 1.90 x 1.36 cm and right ovary-2.84 x 2.40 x 1.92 cm. Patient admits  to  nausea, sweats, fatigue,  headaches and loose stools but denies urinary tract or vaginitis symptoms.  A review of both medical and surgical management options were given o patient however she wishes to proceed with hysteroscopy, dilatation and curettage with possible resection of submucosal fibroid.  Past Medical History  OB History: G0P0   GYN History: menarche: 42 YO;  LMP: see HPI    Contracepton Depo-Provera  The patient reports a past history of: chlamydia and herpes.  Denies history of abnormal PAP smear  Last PAP smear:  08/16/11  Medical History: GERD, anemia, PMS, migraines, Vitamin D Deficiency, MRSA and uterine fibroids.  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   Outpatient Encounter Prescriptions as of 11/03/2012  Medication Sig Dispense Refill  . acyclovir (ZOVIRAX) 400 MG tablet Take 400 mg by mouth 4 (four) times daily as needed.       . azelastine (ASTELIN) 137 MCG/SPRAY nasal spray Place 1 spray into the nose as needed. 1 spray each nostril daily      . Biotin 1 MG CAPS Take 1 mg by mouth daily. Chewables.      . Calcium 600-400 MG-UNIT CHEW Chew 1 each by mouth daily.      . cetirizine (ZYRTEC) 10 MG tablet Take 10 mg by mouth daily.      . clarithromycin (BIAXIN) 500 MG tablet Take 500 mg by mouth 2 (two) times daily.      . Fe Fum-FePoly-Vit C-Vit B3 (INTEGRA PO) Take by mouth at bedtime.       . HYDROcodone-acetaminophen (NORCO/VICODIN) 5-325 MG per tablet Take 1-2 tablets by mouth every 6 (six) hours as needed for pain.      . ibuprofen (ADVIL,MOTRIN) 200 MG tablet Take 800 mg by mouth every 6 (six) hours as needed for pain (for migraines).      . ketorolac (TORADOL) 10 MG tablet Take 1 tablet (10 mg total) by mouth every 6 (six) hours as needed for pain.  20 tablet  0  . Multiple Vitamin (MULTIVITAMIN WITH MINERALS) TABS Take 1 tablet by mouth daily. Uses chewables.      .   norethindrone (MICRONOR,CAMILA,ERRIN) 0.35 MG tablet Take 1 tablet (0.35 mg total) by mouth daily.  1 Package  11  . ranitidine (ZANTAC) 75 MG tablet Take 75 mg by mouth at bedtime as needed for heartburn.      . sertraline (ZOLOFT) 100 MG tablet 200 mg at bedtime.       . topiramate (TOPAMAX) 50 MG tablet Take 100 mg by mouth at bedtime.        No facility-administered encounter medications on file as of 11/03/2012.    Allergies  Allergen Reactions  . Erythromycin     vomiting  . Iron     Stomach pain. Pt can take Integra for iron  . Vitamin D Analogs     Stomach pains.    Denies sensitivity to peanuts, shellfish,  or soy. Admits to sensitivity to Latex and Adhesives  ROS: Admits to nausea,  loose stools,  headache, but denies vision changes, nasal congestion, dysphagia, tinnitus, dizziness, hoarseness, cough,  chest pain, shortness of breath, vomiting, constipation,  urinary frequency, urgency  dysuria, hematuria, vaginitis symptoms,  swelling of joints,easy bruising,  myalgias, arthralgias, skin rashes, unexplained weight loss and except as is mentioned in the history of present illness, patient's review of systems is otherwise negative.  Physical Exam  Bp-124/72    P 82   R 16  T  99.1 degrees F   Weight 242lbs  Height  5' 4.75 "   BMI 40.6  Neck: supple without masses or thyromegaly Lungs: clear to auscultation Heart: regular rate and rhythm Abdomen: soft, non-tender and no organomegaly Pelvic:EGBUS- wnl; vagina-normal rugae, scant blood; uterus-normal size, cervix without lesions or motion tenderness; adnexae-no tenderness or masses Extremities:  no clubbing, cyanosis or edema   Assesment:  Menorrhagia                       Fibroids   Disposition:  A discussion was held with patient regarding the indication for her procedure(s) along with the risks, which include but are not limited to: reaction to anesthesia, damage to adjacent organs, infection, excessive bleeding and endometrial scarring. The patient verblized understanding of these risks and has consented to proceed with Hysteroscopy, Dilatation, Curettage and Possible Resection of a Submucosal Fibroid at [facility] on November 05, 2012.   CSN# [id number]   Reeve Mallo J. Anniah Glick, PA-C  for Dr. Angela Y.Roberts   

## 2012-11-05 NOTE — Transfer of Care (Signed)
Immediate Anesthesia Transfer of Care Note  Patient: Yvonne Banks  Procedure(s) Performed: Procedure(s): DILATATION & CURETTAGE/HYSTEROSCOPY WITH RESECTOCOPE (N/A)  Patient Location: PACU  Anesthesia Type:General  Level of Consciousness: sedated  Airway & Oxygen Therapy: Patient Spontanous Breathing and Patient connected to nasal cannula oxygen  Post-op Assessment: Report given to PACU RN and Post -op Vital signs reviewed and stable  Post vital signs: stable  Complications: No apparent anesthesia complications

## 2012-11-05 NOTE — Interval H&P Note (Signed)
History and Physical Interval Note:  11/05/2012 8:36 AM  Yvonne Banks  has presented today for surgery, with the diagnosis of Mernorrhagia and Fibroids  The various methods of treatment have been discussed with the patient and family. After consideration of risks, benefits and other options for treatment, the patient has consented to  Procedure(s): DILATATION & CURETTAGE/HYSTEROSCOPY WITH RESECTOCOPE (N/A) as a surgical intervention .  The patient's history has been reviewed, patient examined, no change in status, stable for surgery.  I have reviewed the patient's chart and labs.  Questions were answered to the patient's satisfaction.     Purcell Nails

## 2012-11-08 ENCOUNTER — Encounter (HOSPITAL_COMMUNITY): Payer: Self-pay | Admitting: Obstetrics and Gynecology

## 2012-11-10 ENCOUNTER — Ambulatory Visit (INDEPENDENT_AMBULATORY_CARE_PROVIDER_SITE_OTHER): Payer: 59 | Admitting: Infectious Diseases

## 2012-11-10 ENCOUNTER — Encounter: Payer: Self-pay | Admitting: Infectious Diseases

## 2012-11-10 VITALS — BP 142/71 | HR 118 | Temp 98.5°F | Ht 65.0 in | Wt 244.0 lb

## 2012-11-10 DIAGNOSIS — A4902 Methicillin resistant Staphylococcus aureus infection, unspecified site: Secondary | ICD-10-CM

## 2012-11-10 MED ORDER — MUPIROCIN CALCIUM 2 % NA OINT: TOPICAL_OINTMENT | Freq: Two times a day (BID) | NASAL | Status: DC

## 2012-11-10 MED ORDER — CHLORHEXIDINE GLUCONATE CLOTH 2 % EX PADS: 6.0000 | MEDICATED_PAD | Freq: Every day | CUTANEOUS | Status: DC

## 2012-11-10 MED ORDER — SULFAMETHOXAZOLE-TRIMETHOPRIM 800-160 MG PO TABS: 1.0000 | ORAL_TABLET | Freq: Two times a day (BID) | ORAL | Status: AC

## 2012-11-10 NOTE — Progress Notes (Signed)
  Subjective:    Patient ID: Yvonne Banks, female    DOB: 11-25-1970, 42 y.o.   MRN: [id number]  HPI 42 yo F with hx of IBS, depression, and genital herpes. Was seen 11-26, 9-6 and 8-5 with MRSA episodes. Cx done 8-7 showed MRSA (s- clinda, gent, tet, bactrim, rif, vanco).  Was seen in ID clinic 06-2012 and was given monthly bactroban  weekly CHG. Completed these after only 2 weeks.   Has noted 2 L perineal bumps, also has LN tenderness. Had 4-18 hysteroscopy for her fibroids.    No chills. Maybe fevers- relates to her fibroids.  Wt loss has been stalled. Has been exercsing less due to uterine pain and bleeding.   Review of Systems    examine done with RN in room Objective:   Physical Exam  Constitutional: She appears well-developed and well-nourished.  Genitourinary:             Assessment & Plan:

## 2012-11-10 NOTE — Assessment & Plan Note (Addendum)
She appears to have a recurrence. She used the bactroban only once after her previous visit. I have asked her to use this for the first 5 days of each month for the next 6 months. Will give her a rx for bactrim for the next 2 weeks. Also restart the weekly CHG washes. She asks about shaving the area which I discouraged as this may make her skin more likely to get infected (more breaks in skin).  I do not believe that this should delay her next GYN procedure but would give her peri-procedure vancomycin rtc 4-5 months

## 2012-11-10 NOTE — Addendum Note (Signed)
Addended by: Khalel Alms C on: 11/10/2012 03:10 PM   Modules accepted: Orders

## 2012-11-11 ENCOUNTER — Telehealth: Payer: Self-pay | Admitting: *Deleted

## 2012-11-11 NOTE — Telephone Encounter (Signed)
Per patient, the pharmacy received a prescription for CHG pads rather than CHG soap that she thought she was prescribed.  Is this correct? If so, patient needs additional instructions on their use. Please advise.

## 2012-11-15 NOTE — Telephone Encounter (Signed)
Can you change rx to soap instead of CHG pads

## 2012-11-16 ENCOUNTER — Other Ambulatory Visit: Payer: Self-pay | Admitting: *Deleted

## 2012-11-16 DIAGNOSIS — A4902 Methicillin resistant Staphylococcus aureus infection, unspecified site: Secondary | ICD-10-CM

## 2012-11-16 NOTE — Progress Notes (Signed)
Pharmacy gave patient OTC hibiclens in lieu of CHG pads (which they did not have in stock). Andree Coss, RN

## 2012-11-25 ENCOUNTER — Encounter: Payer: Self-pay | Admitting: Internal Medicine

## 2012-11-25 ENCOUNTER — Ambulatory Visit (INDEPENDENT_AMBULATORY_CARE_PROVIDER_SITE_OTHER): Payer: 59 | Admitting: Internal Medicine

## 2012-11-25 VITALS — BP 133/81 | HR 96 | Temp 98.3°F | Wt 243.0 lb

## 2012-11-25 DIAGNOSIS — L732 Hidradenitis suppurativa: Secondary | ICD-10-CM

## 2012-11-25 NOTE — Progress Notes (Signed)
[facility] NOTE  RFV: recurrent MRSA  Subjective:    Patient ID: Yvonne Banks, female    DOB: Dec 06, 1970, 42 y.o.   MRN: [id number]  HPI 42 yo F with hx of IBS, depression, and genital herpes. Was seen 11-26, 9-6 and 8-5 with MRSA episodes. Cx done 8-7 showed MRSA (s- clinda, gent, tet, bactrim, rif, vanco).  Was seen in ID clinic 06-2012 and was given monthly bactroban weekly CHG. Completed these after only 2 weeks.  Has noted 2 L perineal bumps, also has LN tenderness. Had 4-18 hysteroscopy for her fibroids.  No chills. Maybe fevers- relates to her fibroids.  Wt loss has been stalled. Has been exercsing less due to uterine pain and bleeding.   Recurrent mrsa skin infection since June 2013, boil in genital area. Also had left axillary lesion in aug 2013.  Feels that her symptoms have now resolved. Has been on antibiotics every month for the past 6 months. 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 Has missed work being sick  Current Outpatient Prescriptions on File Prior to Visit  Medication Sig Dispense Refill  . acyclovir (ZOVIRAX) 400 MG tablet Take 400 mg by mouth 4 (four) times daily as needed.       Marland Kitchen azelastine (ASTELIN) 137 MCG/SPRAY nasal spray Place 1 spray into the nose as needed. 1 spray each nostril daily      . Biotin 1 MG CAPS Take 1 mg by mouth daily. Chewables.      . Calcium 600-400 MG-UNIT CHEW Chew 1 each by mouth daily.      . cetirizine (ZYRTEC) 10 MG tablet Take 10 mg by mouth daily.      . Chlorhexidine Gluconate Cloth 2 % PADS Apply 6 each topically daily at 6 (six) AM. Wash all skin surfaces (except eyes, mouth) once weekly  50 each  3  . Fe Fum-FePoly-Vit C-Vit B3 (INTEGRA PO) Take by mouth at bedtime.       Marland Kitchen HYDROcodone-acetaminophen (NORCO/VICODIN) 5-325 MG per tablet Take 1-2 tablets by mouth every 6 (six) hours as needed for pain.  30 tablet  0   . ibuprofen (ADVIL,MOTRIN) 200 MG tablet Take 4 tablets (800 mg total) by mouth every 8 (eight) hours as needed for pain (for migraines).  30 tablet  1  . Multiple Vitamin (MULTIVITAMIN WITH MINERALS) TABS Take 1 tablet by mouth daily. Uses chewables.      . mupirocin nasal ointment (BACTROBAN) 2 % Place into the nose 2 (two) times daily. Use one-half of tube in each nostril twice daily for five (5) days. After application, press sides of nose together and gently massage. use for the first 5 days of each month for the next 6 months.  10 g  6  . norethindrone (MICRONOR,CAMILA,ERRIN) 0.35 MG tablet Take 1 tablet (0.35 mg total) by mouth daily.  1 Package  11  . ranitidine (ZANTAC) 75 MG tablet Take 75 mg by mouth at bedtime as needed for heartburn.      . sertraline (ZOLOFT) 100 MG tablet 200 mg at bedtime.       . topiramate (TOPAMAX) 50 MG tablet Take 100 mg by mouth at bedtime.        No current facility-administered medications on file prior to visit.   Active Ambulatory Problems    Diagnosis Date Noted  . MRSA infection 06/28/2012  .  HSV (herpes simplex virus) anogenital infection 06/28/2012  . Migraines 09/07/2012  . Menorrhagia 09/20/2012  . Fibroids 09/20/2012   Resolved Ambulatory Problems    Diagnosis Date Noted  . No Resolved Ambulatory Problems   Past Medical History  Diagnosis Date  . IBS (irritable bowel syndrome)   . PMS (premenstrual syndrome)   . Anxiety   . Herpes genitalis in women   . Allergy   . Insomnia   . Vitamin D deficiency   . Anemia menometrorrhaghia  . MRSA (methicillin resistant staph aureus) culture positive   . Thyromegaly   . PMS (premenstrual syndrome)   . History of chlamydia   . GERD (gastroesophageal reflux disease)      Review of Systems  Constitutional: Negative for fever, chills, diaphoresis, activity change, appetite change, fatigue and unexpected weight change.  HENT: Negative for congestion, sore throat, rhinorrhea, sneezing,  trouble swallowing and sinus pressure.  Eyes: Negative for photophobia and visual disturbance.  Respiratory: Negative for cough, chest tightness, shortness of breath, wheezing and stridor.  Cardiovascular: Negative for chest pain, palpitations and leg swelling.  Gastrointestinal: Negative for nausea, vomiting, abdominal pain, diarrhea, constipation, blood in stool, abdominal distention and anal bleeding.  Genitourinary: Negative for dysuria, hematuria, flank pain and difficulty urinating.  Musculoskeletal: Negative for myalgias, back pain, joint swelling, arthralgias and gait problem.  Skin: Negative for color change, pallor, rash and wound.  Neurological: Negative for dizziness, tremors, weakness and light-headedness.  Hematological: Negative for adenopathy. Does not bruise/bleed easily.  Psychiatric/Behavioral: Negative for behavioral problems, confusion, sleep disturbance, dysphoric mood, decreased concentration and agitation.       Objective:   Physical Exam BP 133/81  Pulse 96  Temp(Src) 98.3 F (36.8 C) (Oral)  Wt 243 lb (110.224 kg)  BMI 40.44 kg/m2 Physical Exam  Constitutional: oriented to person, place, and time.  appears well-developed and well-nourished. No distress.  gu exam unrevealing     Assessment & Plan:  Skin infection c/w hidrdinitis = no new lesions that would require antibiotics. Continue with hot compresses to affected areas. Will email info on [email or web address]

## 2012-11-30 ENCOUNTER — Other Ambulatory Visit: Payer: Self-pay | Admitting: Obstetrics and Gynecology

## 2012-12-03 ENCOUNTER — Ambulatory Visit (HOSPITAL_COMMUNITY): Payer: 59

## 2012-12-03 ENCOUNTER — Encounter (HOSPITAL_COMMUNITY): Payer: Self-pay | Admitting: Anesthesiology

## 2012-12-03 ENCOUNTER — Ambulatory Visit (HOSPITAL_COMMUNITY): Payer: 59 | Admitting: Anesthesiology

## 2012-12-03 ENCOUNTER — Encounter (HOSPITAL_COMMUNITY)
Admission: RE | Disposition: A | Discharge status: Home / Self Care | Payer: Self-pay | Source: Ambulatory Visit | Attending: Obstetrics and Gynecology

## 2012-12-03 ENCOUNTER — Ambulatory Visit (HOSPITAL_COMMUNITY)
Admission: RE | Admit: 2012-12-03 | Discharge: 2012-12-03 | Disposition: A | Discharge status: Home / Self Care | Payer: 59 | Source: Ambulatory Visit | Attending: Obstetrics and Gynecology | Admitting: Obstetrics and Gynecology

## 2012-12-03 DIAGNOSIS — D250 Submucous leiomyoma of uterus: Secondary | ICD-10-CM | POA: Insufficient documentation

## 2012-12-03 DIAGNOSIS — N920 Excessive and frequent menstruation with regular cycle: Secondary | ICD-10-CM | POA: Insufficient documentation

## 2012-12-03 LAB — CBC
HCT: 34.1 % — ABNORMAL LOW (ref 36.0–46.0)
Hemoglobin: 11 g/dL — ABNORMAL LOW (ref 12.0–15.0)
MCV: 79.9 fL (ref 78.0–100.0)
RBC: 4.27 MIL/uL (ref 3.87–5.11)
WBC: 7.9 10*3/uL (ref 4.0–10.5)

## 2012-12-03 SURGERY — DILATATION & CURETTAGE/HYSTEROSCOPY WITH TRUCLEAR
Anesthesia: General | Site: Uterus | Wound class: Clean Contaminated

## 2012-12-03 MED ORDER — SODIUM CHLORIDE 0.9 % IR SOLN
Status: DC | PRN
  Administered 2012-12-03: 12000 mL

## 2012-12-03 MED ORDER — SODIUM CHLORIDE 0.9 % IJ SOLN
INTRAMUSCULAR | Status: AC
  Filled 2012-12-03: qty 3

## 2012-12-03 MED ORDER — LIDOCAINE HCL (CARDIAC) 20 MG/ML IV SOLN
INTRAVENOUS | Status: DC | PRN
  Administered 2012-12-03: 30 mg via INTRAVENOUS
  Administered 2012-12-03: 40 mg via INTRAVENOUS
  Administered 2012-12-03: 30 mg via INTRAVENOUS

## 2012-12-03 MED ORDER — FENTANYL CITRATE 0.05 MG/ML IJ SOLN
INTRAMUSCULAR | Status: AC
  Administered 2012-12-03: 50 ug via INTRAVENOUS
  Filled 2012-12-03: qty 2

## 2012-12-03 MED ORDER — ONDANSETRON HCL 4 MG/2ML IJ SOLN
INTRAMUSCULAR | Status: DC | PRN
  Administered 2012-12-03: 4 mg via INTRAVENOUS

## 2012-12-03 MED ORDER — ONDANSETRON HCL 4 MG/2ML IJ SOLN
INTRAMUSCULAR | Status: AC
  Filled 2012-12-03: qty 2

## 2012-12-03 MED ORDER — PROPOFOL 10 MG/ML IV EMUL
INTRAVENOUS | Status: AC
  Filled 2012-12-03: qty 20

## 2012-12-03 MED ORDER — KETOROLAC TROMETHAMINE 30 MG/ML IJ SOLN
INTRAMUSCULAR | Status: AC
  Filled 2012-12-03: qty 1

## 2012-12-03 MED ORDER — ALBUTEROL SULFATE HFA 108 (90 BASE) MCG/ACT IN AERS
2.0000 | INHALATION_SPRAY | RESPIRATORY_TRACT | Status: DC | PRN
  Administered 2012-12-03: 2 via RESPIRATORY_TRACT
  Filled 2012-12-03: qty 6.7

## 2012-12-03 MED ORDER — FENTANYL CITRATE 0.05 MG/ML IJ SOLN
INTRAMUSCULAR | Status: AC
  Filled 2012-12-03: qty 5

## 2012-12-03 MED ORDER — ONDANSETRON HCL 4 MG/2ML IJ SOLN: 4.0000 mg | Freq: Once | INTRAMUSCULAR | Status: DC | PRN

## 2012-12-03 MED ORDER — VASOPRESSIN 20 UNIT/ML IJ SOLN
INTRAMUSCULAR | Status: AC
  Filled 2012-12-03: qty 1

## 2012-12-03 MED ORDER — LACTATED RINGERS IV SOLN
INTRAVENOUS | Status: DC
  Administered 2012-12-03 (×2): via INTRAVENOUS

## 2012-12-03 MED ORDER — PROPOFOL 10 MG/ML IV BOLUS
INTRAVENOUS | Status: DC | PRN
  Administered 2012-12-03: 200 ug via INTRAVENOUS

## 2012-12-03 MED ORDER — FENTANYL CITRATE 0.05 MG/ML IJ SOLN
25.0000 ug | INTRAMUSCULAR | Status: DC | PRN
  Administered 2012-12-03: 50 ug via INTRAVENOUS

## 2012-12-03 MED ORDER — FAMOTIDINE 20 MG PO TABS
ORAL_TABLET | ORAL | Status: AC
  Administered 2012-12-03: 20 mg
  Filled 2012-12-03: qty 1

## 2012-12-03 MED ORDER — DIPHENHYDRAMINE HCL 50 MG/ML IJ SOLN
INTRAMUSCULAR | Status: AC
  Administered 2012-12-03: 25 mg
  Filled 2012-12-03: qty 1

## 2012-12-03 MED ORDER — LIDOCAINE HCL (CARDIAC) 20 MG/ML IV SOLN
INTRAVENOUS | Status: AC
  Filled 2012-12-03: qty 5

## 2012-12-03 MED ORDER — KETOROLAC TROMETHAMINE 30 MG/ML IJ SOLN: 15.0000 mg | Freq: Once | INTRAMUSCULAR | Status: DC | PRN

## 2012-12-03 MED ORDER — HYDROCODONE-ACETAMINOPHEN 5-300 MG PO TABS: 1.0000 | ORAL_TABLET | Freq: Four times a day (QID) | ORAL | Status: DC | PRN

## 2012-12-03 MED ORDER — MEPERIDINE HCL 25 MG/ML IJ SOLN: 6.2500 mg | INTRAMUSCULAR | Status: DC | PRN

## 2012-12-03 MED ORDER — DEXAMETHASONE SODIUM PHOSPHATE 4 MG/ML IJ SOLN
INTRAMUSCULAR | Status: AC
  Administered 2012-12-03: 10 mg
  Filled 2012-12-03: qty 3

## 2012-12-03 MED ORDER — MIDAZOLAM HCL 2 MG/2ML IJ SOLN
INTRAMUSCULAR | Status: AC
  Filled 2012-12-03: qty 2

## 2012-12-03 MED ORDER — LIDOCAINE HCL 1 % IJ SOLN
INTRAMUSCULAR | Status: DC | PRN
  Administered 2012-12-03: 10 mL

## 2012-12-03 MED ORDER — MIDAZOLAM HCL 5 MG/5ML IJ SOLN
INTRAMUSCULAR | Status: DC | PRN
  Administered 2012-12-03: 2 mg via INTRAVENOUS

## 2012-12-03 MED ORDER — FENTANYL CITRATE 0.05 MG/ML IJ SOLN
INTRAMUSCULAR | Status: DC | PRN
  Administered 2012-12-03: 50 ug via INTRAVENOUS
  Administered 2012-12-03: 25 ug via INTRAVENOUS
  Administered 2012-12-03: 50 ug via INTRAVENOUS
  Administered 2012-12-03: 25 ug via INTRAVENOUS
  Administered 2012-12-03: 50 ug via INTRAVENOUS
  Administered 2012-12-03 (×2): 25 ug via INTRAVENOUS

## 2012-12-03 MED ORDER — IBUPROFEN 600 MG PO TABS: 600.0000 mg | ORAL_TABLET | Freq: Four times a day (QID) | ORAL | Status: DC | PRN

## 2012-12-03 MED ORDER — KETOROLAC TROMETHAMINE 30 MG/ML IJ SOLN
INTRAMUSCULAR | Status: DC | PRN
  Administered 2012-12-03: 30 mg via INTRAVENOUS

## 2012-12-03 SURGICAL SUPPLY — 24 items
CANISTER SUCTION 2500CC (MISCELLANEOUS) ×2 IMPLANT
CATH ROBINSON RED A/P 16FR (CATHETERS) ×2 IMPLANT
CLOTH BEACON ORANGE TIMEOUT ST (SAFETY) ×2 IMPLANT
CONTAINER PREFILL 10% NBF 60ML (FORM) ×4 IMPLANT
DECANTER SPIKE VIAL GLASS SM (MISCELLANEOUS) ×2 IMPLANT
DRESSING TELFA 8X3 (GAUZE/BANDAGES/DRESSINGS) ×2 IMPLANT
ELECT REM PT RETURN 9FT ADLT (ELECTROSURGICAL) ×4
ELECTRODE REM PT RTRN 9FT ADLT (ELECTROSURGICAL) ×1 IMPLANT
GAUZE SPONGE 4X4 16PLY XRAY LF (GAUZE/BANDAGES/DRESSINGS) ×2 IMPLANT
GLOVE BIO SURGEON STRL SZ7.5 (GLOVE) ×2 IMPLANT
GLOVE BIOGEL PI IND STRL 7.5 (GLOVE) ×1 IMPLANT
GLOVE BIOGEL PI INDICATOR 7.5 (GLOVE) ×1
GLOVE SURG SS PI 6.5 STRL IVOR (GLOVE) ×4 IMPLANT
GLOVE SURG SS PI 7.0 STRL IVOR (GLOVE) ×3 IMPLANT
GOWN STRL REIN XL XLG (GOWN DISPOSABLE) ×6 IMPLANT
KIT HYSTEROSCOPY TRUCLEAR (ABLATOR) ×1 IMPLANT
MORCELLATOR RECIP TRUCLEAR 4.0 (ABLATOR) ×1 IMPLANT
PACK HYSTEROSCOPY LF (CUSTOM PROCEDURE TRAY) ×2 IMPLANT
PAD OB MATERNITY 4.3X12.25 (PERSONAL CARE ITEMS) ×2 IMPLANT
SUT VICRYL 0 UR6 27IN ABS (SUTURE) ×3 IMPLANT
SYR CONTROL 10ML LL (SYRINGE) ×1 IMPLANT
SYR TB 1ML 25GX5/8 (SYRINGE) ×1 IMPLANT
TOWEL OR 17X24 6PK STRL BLUE (TOWEL DISPOSABLE) ×4 IMPLANT
WATER STERILE IRR 1000ML POUR (IV SOLUTION) ×2 IMPLANT

## 2012-12-03 NOTE — Interval H&P Note (Signed)
History and Physical Interval Note:  12/03/2012 12:20 PM  Yvonne Banks  has presented today for surgery, with the diagnosis of Fibroids  The various methods of treatment have been discussed with the patient and family. After consideration of risks, benefits and other options for treatment, the patient has consented to  Procedure(s): DILATATION & CURETTAGE/HYSTEROSCOPY WITH TRUECLEAR (N/A) as a surgical intervention .  The patient's history has been reviewed, patient examined, no change in status, stable for surgery.  I have reviewed the patient's chart and labs.  Questions were answered to the patient's satisfaction.    Agree with above H&P.  No changes in interim.  Submucosal fibroid was unable to be completely excised that initial day and she is here today for resection with TruClear.     Purcell Nails

## 2012-12-03 NOTE — Transfer of Care (Signed)
Immediate Anesthesia Transfer of Care Note  Patient: Yvonne Banks  Procedure(s) Performed: Procedure(s): DILATATION & CURETTAGE/HYSTEROSCOPY WITH TRUECLEAR (N/A)  Patient Location: PACU  Anesthesia Type:General  Level of Consciousness: awake, sedated and patient cooperative  Airway & Oxygen Therapy: Patient Spontanous Breathing and Patient connected to nasal cannula oxygen  Post-op Assessment: Report given to PACU RN and Post -op Vital signs reviewed and stable  Post vital signs: Reviewed and stable  Complications: No apparent anesthesia complications

## 2012-12-03 NOTE — Progress Notes (Signed)
Patient moved to Phase 2, and RN noticed patient wheezing after getting dressed. Rn called Dr. Lilli Light who examined patient in Phase 2 and gave new orders.  Patient now doing well on 2 L Nasal Cannula with no wheezing noted.  Will continue to assess.  Stephanie Coup, RN

## 2012-12-03 NOTE — Anesthesia Postprocedure Evaluation (Signed)
Anesthesia Post Note  Patient: Yvonne Banks  Procedure(s) Performed: Procedure(s) (LRB): DILATATION & CURETTAGE/HYSTEROSCOPY WITH TRUECLEAR (N/A)  Anesthesia type: General  Patient location: PACU  Post pain: Pain level controlled  Post assessment: Post-op Vital signs reviewed  Last Vitals:  Filed Vitals:   12/03/12 1415  BP: 140/80  Pulse: 82  Temp:   Resp: 16    Post vital signs: Reviewed  Level of consciousness: sedated  Complications: No apparent anesthesia complications

## 2012-12-03 NOTE — Anesthesia Preprocedure Evaluation (Signed)
Anesthesia Evaluation  Patient identified by MRN, date of birth, ID band Patient awake    Reviewed: Allergy & Precautions, H&P , Patient's Chart, lab work & pertinent test results  Airway Mallampati: III TM Distance: >3 FB Neck ROM: full    Dental no notable dental hx. (+) Teeth Intact   Pulmonary neg pulmonary ROS,    Pulmonary exam normal       Cardiovascular negative cardio ROS  Rhythm:regular     Neuro/Psych    GI/Hepatic Neg liver ROS, GERD-  ,  Endo/Other  Morbid obesity  Renal/GU negative Renal ROS  negative genitourinary   Musculoskeletal negative musculoskeletal ROS (+)   Abdominal (+) + obese,   Peds  Hematology negative hematology ROS (+)   Anesthesia Other Findings   Reproductive/Obstetrics negative OB ROS                           Anesthesia Physical Anesthesia Plan  ASA: III  Anesthesia Plan: General   Post-op Pain Management:    Induction: Intravenous  Airway Management Planned: LMA  Additional Equipment:   Intra-op Plan:   Post-operative Plan:   Informed Consent: I have reviewed the patients History and Physical, chart, labs and discussed the procedure including the risks, benefits and alternatives for the proposed anesthesia with the patient or authorized representative who has indicated his/her understanding and acceptance.     Plan Discussed with: CRNA and Surgeon  Anesthesia Plan Comments:         Anesthesia Quick Evaluation

## 2012-12-03 NOTE — H&P (View-Only) (Signed)
Yvonne Banks is a  42 YO female, G0 who  presents for hysteroscopy, dilatation and curettage with resection of a submucosal fibroid because of menorrhagia.  Since November of 2013 the patient reports a 7 day menstrual flow in which she had to change a pad and a super plus tampon every 2 hours.  She passed clots, as large as her hand, and experienced cramps that did not respond to Toradol but did to  Vicodin.   Since her period in November, Yvonne Banks has had daily bleeding, with twice a day pad change, accompanied by pelvic discomfort.  Throughout this ordeal the patient has been on Depo Provera, yet the only 2 weeks she went without any bleeding was when she began  Norethindrone. After two weeks however,  the bleeding resumed. A pelvic ultrasound in February 2014 showed a uterus:8.51 x 8.72 x 7.10 cm with diffuse fibroid involvement but the largest were: left anterior-3.1 x 2.8 x 2.3 cm and a midline with possible submucosal component- 3.4 x 2.7 x 3.1 cm her  left ovary-2.79 x 1.90 x 1.36 cm and right ovary-2.84 x 2.40 x 1.92 cm. Patient admits  to  nausea, sweats, fatigue,  headaches and loose stools but denies urinary tract or vaginitis symptoms.  A review of both medical and surgical management options were given o patient however she wishes to proceed with hysteroscopy, dilatation and curettage with possible resection of submucosal fibroid.  Past Medical History  OB History: G0P0   GYN History: menarche: 42 YO;  LMP: see HPI    Contracepton Depo-Provera  The patient reports a past history of: chlamydia and herpes.  Denies history of abnormal PAP smear  Last PAP smear:  08/16/11  Medical History: GERD, anemia, PMS, migraines, Vitamin D Deficiency, MRSA and uterine fibroids.  Surgical History: negative Denies problems with anesthesia or history of blood transfusions  Family History:  Anemia, hypertension, peptic ulcer disease and colon cancer  Social History:   Single and employed as Psychologist, educational;  Denies alcohol, tobacco or illicit drug use.   Outpatient Encounter Prescriptions as of 11/03/2012  Medication Sig Dispense Refill  . acyclovir (ZOVIRAX) 400 MG tablet Take 400 mg by mouth 4 (four) times daily as needed.       Marland Kitchen azelastine (ASTELIN) 137 MCG/SPRAY nasal spray Place 1 spray into the nose as needed. 1 spray each nostril daily      . Biotin 1 MG CAPS Take 1 mg by mouth daily. Chewables.      . Calcium 600-400 MG-UNIT CHEW Chew 1 each by mouth daily.      . cetirizine (ZYRTEC) 10 MG tablet Take 10 mg by mouth daily.      . clarithromycin (BIAXIN) 500 MG tablet Take 500 mg by mouth 2 (two) times daily.      . Fe Fum-FePoly-Vit C-Vit B3 (INTEGRA PO) Take by mouth at bedtime.       Marland Kitchen HYDROcodone-acetaminophen (NORCO/VICODIN) 5-325 MG per tablet Take 1-2 tablets by mouth every 6 (six) hours as needed for pain.      Marland Kitchen ibuprofen (ADVIL,MOTRIN) 200 MG tablet Take 800 mg by mouth every 6 (six) hours as needed for pain (for migraines).      Marland Kitchen ketorolac (TORADOL) 10 MG tablet Take 1 tablet (10 mg total) by mouth every 6 (six) hours as needed for pain.  20 tablet  0  . Multiple Vitamin (MULTIVITAMIN WITH MINERALS) TABS Take 1 tablet by mouth daily. Uses chewables.      Marland Kitchen  norethindrone (MICRONOR,CAMILA,ERRIN) 0.35 MG tablet Take 1 tablet (0.35 mg total) by mouth daily.  1 Package  11  . ranitidine (ZANTAC) 75 MG tablet Take 75 mg by mouth at bedtime as needed for heartburn.      . sertraline (ZOLOFT) 100 MG tablet 200 mg at bedtime.       . topiramate (TOPAMAX) 50 MG tablet Take 100 mg by mouth at bedtime.        No facility-administered encounter medications on file as of 11/03/2012.    Allergies  Allergen Reactions  . Erythromycin     vomiting  . Iron     Stomach pain. Pt can take Integra for iron  . Vitamin D Analogs     Stomach pains.    Denies sensitivity to peanuts, shellfish,  or soy. Admits to sensitivity to Latex and Adhesives  ROS: Admits to nausea,  loose stools,  headache, but denies vision changes, nasal congestion, dysphagia, tinnitus, dizziness, hoarseness, cough,  chest pain, shortness of breath, vomiting, constipation,  urinary frequency, urgency  dysuria, hematuria, vaginitis symptoms,  swelling of joints,easy bruising,  myalgias, arthralgias, skin rashes, unexplained weight loss and except as is mentioned in the history of present illness, patient's review of systems is otherwise negative.  Physical Exam  Bp-124/72    P 82   R 16  T  99.1 degrees F   Weight 242lbs  Height  5' 4.75 "   BMI 40.6  Neck: supple without masses or thyromegaly Lungs: clear to auscultation Heart: regular rate and rhythm Abdomen: soft, non-tender and no organomegaly Pelvic:EGBUS- wnl; vagina-normal rugae, scant blood; uterus-normal size, cervix without lesions or motion tenderness; adnexae-no tenderness or masses Extremities:  no clubbing, cyanosis or edema   Assesment:  Menorrhagia                       Fibroids   Disposition:  A discussion was held with patient regarding the indication for her procedure(s) along with the risks, which include but are not limited to: reaction to anesthesia, damage to adjacent organs, infection, excessive bleeding and endometrial scarring. The patient verblized understanding of these risks and has consented to proceed with Hysteroscopy, Dilatation, Curettage and Possible Resection of a Submucosal Fibroid at [facility] on November 05, 2012.   CSN# [id number]   Gwynne Kemnitz J. Lowell Guitar, PA-C  for Dr. Woodroe Mode.Su Hilt

## 2012-12-03 NOTE — Op Note (Signed)
Preop Diagnosis: Fibroids   Postop Diagnosis: Fibroids   Procedure: HYSTEROSCOPY WITH TRUCLEAR RESECTION  Anesthesia: General   Attending: Purcell Nails, MD   Assistant: N/a   Findings: Large submucosal fibroid base at left lateral wall  Pathology: Portions of resected fibroid  Fluids: 1600 cc Hysteroscopic fluid deficit 2400cc  UOP: 250cc via straight cath prior to procedure  EBL: 50 cc  Complications: None  Procedure:The patient was taken to the operating room after the risks, benefits and alternatives were discussed with the patient. The patient verbalized understanding and consent signed and witnessed. The patient was placed under general anesthesia with an LMA per anesthesiologist and prepped and draped in the normal sterile fashion.  Time Out was performed per protocol.  A bivalve speculum was placed in the patient's vagina and the anterior lip of the cervix was grasped with a single tooth tenaculum. A paracervical block was administered using a total of 10 cc of 1% lidocaine. The uterus sounded to 10.5 cm. The cervix was dilated for passage of the hysteroscope.  The TruClear hysteroscope was introduced into the uterine cavity and findings as noted above.  Resection performed and entire fibroid removed flush to the lateral wall.  No obvious remaining intracavitary lesions were noted.  Bleeding at tenaculum sites were made hemostatic with 0-vicryl.  All instruments were removed. Sponge lap and needle count was correct. The patient tolerated the procedure well and was returned to the recovery room in good condition.

## 2013-01-14 HISTORY — PX: OTHER SURGICAL HISTORY: SHX169

## 2013-04-21 ENCOUNTER — Other Ambulatory Visit: Payer: Self-pay | Admitting: Family Medicine

## 2013-04-21 DIAGNOSIS — R109 Unspecified abdominal pain: Secondary | ICD-10-CM

## 2013-04-28 ENCOUNTER — Ambulatory Visit
Admission: RE | Admit: 2013-04-28 | Discharge: 2013-04-28 | Disposition: A | Discharge status: Home / Self Care | Payer: 59 | Source: Ambulatory Visit | Attending: Family Medicine | Admitting: Family Medicine

## 2013-04-28 DIAGNOSIS — R109 Unspecified abdominal pain: Secondary | ICD-10-CM

## 2013-05-05 ENCOUNTER — Encounter (INDEPENDENT_AMBULATORY_CARE_PROVIDER_SITE_OTHER): Payer: Self-pay | Admitting: General Surgery

## 2013-05-05 ENCOUNTER — Ambulatory Visit (INDEPENDENT_AMBULATORY_CARE_PROVIDER_SITE_OTHER): Payer: 59 | Admitting: General Surgery

## 2013-05-05 VITALS — BP 121/80 | HR 76 | Temp 98.6°F | Resp 12 | Ht 65.0 in | Wt 229.8 lb

## 2013-05-05 DIAGNOSIS — K8020 Calculus of gallbladder without cholecystitis without obstruction: Secondary | ICD-10-CM

## 2013-05-05 NOTE — Progress Notes (Signed)
Patient ID: Yvonne Banks, female   DOB: 01/14/1971, 41 y.o.   MRN: [id number]  Chief Complaint  Patient presents with  . Abdominal Pain    gallbladder    HPI Yvonne Banks is a 41 y.o. female.  This patient is referred by Dr. Wolters for evaluation of symptomatically lithiasis. She has not had any issues until about 4 weeks ago when she began having epigastric and retrosternal chest pain which radiated down to her abdomen and through to her back. Since then she has continued to have some ongoing symptoms and she does note some association with food although not a saline with fatty foods. She says that it is a pretty constant pain but certainly does have episodes of more intense severity. She has associated nausea but no vomiting and denies any fevers or chills. She says that she has tried Hycosamine without any improvement.  Prilosec has helped to some but she continues to have symptoms despite Prilosec. She does have a history of irritable bowel as well as gastritis and she has also had a history of EGD and colonoscopy for evaluation of her irritable bowel syndrome in 2008. She does have an ultrasound which was consistent with cholelithiasis.   She says that she has not had any blood in her stool or melena although her stools have been fairly loose. HPI  Past Medical History  Diagnosis Date  . IBS (irritable bowel syndrome)   . PMS (premenstrual syndrome)   . Anxiety   . Herpes genitalis in women   . Allergy   . Insomnia   . Migraines   . Vitamin D deficiency   . Anemia menometrorrhaghia  . MRSA (methicillin resistant staph aureus) culture positive     skin infection7/2013  . Fibroids   . Thyromegaly   . PMS (premenstrual syndrome)   . History of chlamydia   . GERD (gastroesophageal reflux disease)     treatment with diet - no meds    Past Surgical History  Procedure Laterality Date  . Wisdom tooth extraction    . Dilatation & currettage/hysteroscopy with resectocope N/A  11/05/2012    Procedure: DILATATION & CURETTAGE/HYSTEROSCOPY WITH RESECTOCOPE;  Surgeon: Angela Y Roberts, MD;  Location: [facility];  Service: Gynecology;  Laterality: N/A;    Family History  Problem Relation Age of Onset  . Anemia Mother   . Hypertension Mother   . Ulcers Sister   . Cancer Maternal Grandmother     colon    Social History History  Substance Use Topics  . Smoking status: Never Smoker   . Smokeless tobacco: Never Used  . Alcohol Use: No    Allergies  Allergen Reactions  . Erythromycin     vomiting  . Iron     Stomach pain. Pt can take Integra for iron    Current Outpatient Prescriptions  Medication Sig Dispense Refill  . acyclovir (ZOVIRAX) 400 MG tablet Take 400 mg by mouth 4 (four) times daily as needed. For breakouts      . cetirizine (ZYRTEC) 10 MG tablet Take 10 mg by mouth daily.      . chlorhexidine (HIBICLENS) 4 % external liquid Apply 1 application topically daily as needed (use for 1st 5 days of the month for next 6 months).      . clonazePAM (KLONOPIN) 0.5 MG tablet       . HYDROcodone-acetaminophen (NORCO/VICODIN) 5-325 MG per tablet Take 1-2 tablets by mouth every 6 (six) hours as needed for pain.      .   Hydrocodone-Acetaminophen 5-300 MG TABS Take 1 tablet by mouth every 6 (six) hours as needed.  30 each  0  . hyoscyamine (LEVSIN, ANASPAZ) 0.125 MG tablet       . ibuprofen (ADVIL,MOTRIN) 600 MG tablet Take 1 tablet (600 mg total) by mouth every 6 (six) hours as needed for pain.  30 tablet  0  . ketorolac (TORADOL) 10 MG tablet       . mupirocin nasal ointment (BACTROBAN) 2 % Place 1 application into the nose 2 (two) times daily. Use one-half of tube in each nostril twice daily for five (5) days. After application, press sides of nose together and gently massage. use for the first 5 days of each month for the next 6 months. Started in May 2014      . norethindrone (MICRONOR,CAMILA,ERRIN) 0.35 MG tablet Take 1 tablet (0.35 mg total) by mouth daily.  1  Package  11  . ranitidine (ZANTAC) 75 MG tablet Take 75 mg by mouth at bedtime as needed for heartburn.      . sertraline (ZOLOFT) 100 MG tablet 200 mg at bedtime.       . topiramate (TOPAMAX) 100 MG tablet Take 100 mg by mouth at bedtime.      . Vitamin D, Ergocalciferol, (DRISDOL) 50000 UNITS CAPS capsule        No current facility-administered medications for this visit.    Review of Systems Review of Systems All other review of systems negative or noncontributory except as stated in the HPI  Blood pressure 121/80, pulse 76, temperature 98.6 F (37 C), temperature source Temporal, resp. rate 12, height 5' 5" (1.651 m), weight 229 lb 12.8 oz (104.237 kg).  Physical Exam Physical Exam Physical Exam  Nursing note and vitals reviewed. Constitutional: She is oriented to person, place, and time. She appears well-developed and well-nourished. No distress.  HENT:  Head: Normocephalic and atraumatic.  Mouth/Throat: No oropharyngeal exudate.  Eyes: Conjunctivae and EOM are normal. Pupils are equal, round, and reactive to light. Right eye exhibits no discharge. Left eye exhibits no discharge. No scleral icterus.  Neck: Normal range of motion. Neck supple. No tracheal deviation present.  Cardiovascular: Normal rate, regular rhythm, normal heart sounds and intact distal pulses.   Pulmonary/Chest: Effort normal and breath sounds normal. No stridor. No respiratory distress. She has no wheezes.  Abdominal: Soft. Bowel sounds are normal. She exhibits no distension and no mass. There is no tenderness. There is no rebound and no guarding.  Musculoskeletal: Normal range of motion. She exhibits no edema and no tenderness.  Neurological: She is alert and oriented to person, place, and time.  Skin: Skin is warm and dry. No rash noted. She is not diaphoretic. No erythema. No pallor.  Psychiatric: She has a normal mood and affect. Her behavior is normal. Judgment and thought content normal.    Data  Reviewed US, outside records  Assessment    Abdominal pain and cholelithiasis I think that there is a good chance that her symptoms are related to gallstones.  She does have some symptoms which are not entirely classic for symptomatic cholelithiasis but given her abdominal pain with radiation to her back and associated nausea and ultrasound findings consistent with cholelithiasis I think that we have enough evidence to offer her cholecystectomy. This could be related to gastritis or irritable bowel as well but she is already on treatments for these without any significant improvement. I did offer her cholecystectomy for possible treatment of her symptoms although   I did explain that one of the risks of the surgery would be persistent symptoms postoperatively. We also discussed the other risks of the surgery. The risks of infection, bleeding, pain, persistent symptoms, scarring, injury to bowel or bile ducts, retained stone, diarrhea, need for additional procedures, and need for open surgery discussed with the patient. She would like to proceed with cholecystectomy.      Plan    We will set her up for cholecystectomy when convenient.        Saba Neuman DAVID 05/05/2013, 9:04 AM    

## 2013-05-12 ENCOUNTER — Encounter (HOSPITAL_COMMUNITY): Payer: Self-pay | Admitting: Pharmacy Technician

## 2013-05-13 NOTE — Progress Notes (Signed)
Chest x-ray 12/03/12 on EPIC

## 2013-05-16 ENCOUNTER — Encounter (HOSPITAL_COMMUNITY): Payer: Self-pay

## 2013-05-16 ENCOUNTER — Encounter (HOSPITAL_COMMUNITY)
Admission: RE | Admit: 2013-05-16 | Discharge: 2013-05-16 | Disposition: A | Discharge status: Home / Self Care | Payer: 59 | Source: Ambulatory Visit | Attending: General Surgery | Admitting: General Surgery

## 2013-05-16 VITALS — BP 118/73 | HR 79 | Temp 98.1°F | Resp 16 | Ht 65.0 in | Wt 227.4 lb

## 2013-05-16 DIAGNOSIS — Z01812 Encounter for preprocedural laboratory examination: Secondary | ICD-10-CM | POA: Insufficient documentation

## 2013-05-16 DIAGNOSIS — K8020 Calculus of gallbladder without cholecystitis without obstruction: Secondary | ICD-10-CM

## 2013-05-16 DIAGNOSIS — Z01810 Encounter for preprocedural cardiovascular examination: Secondary | ICD-10-CM | POA: Insufficient documentation

## 2013-05-16 DIAGNOSIS — Z01818 Encounter for other preprocedural examination: Secondary | ICD-10-CM | POA: Insufficient documentation

## 2013-05-16 HISTORY — DX: Calculus of gallbladder without cholecystitis without obstruction: K80.20

## 2013-05-16 HISTORY — DX: Pain in right knee: M25.561

## 2013-05-16 HISTORY — DX: Other complications of anesthesia, initial encounter: T88.59XA

## 2013-05-16 HISTORY — DX: Adverse effect of unspecified anesthetic, initial encounter: T41.45XA

## 2013-05-16 HISTORY — DX: Shortness of breath: R06.02

## 2013-05-16 HISTORY — DX: Pain in left knee: M25.562

## 2013-05-16 LAB — CBC WITH DIFFERENTIAL/PLATELET
Basophils Relative: 0 % (ref 0–1)
Eosinophils Absolute: 0.1 10*3/uL (ref 0.0–0.7)
Lymphocytes Relative: 30 % (ref 12–46)
Lymphs Abs: 1.9 10*3/uL (ref 0.7–4.0)
MCH: 25.7 pg — ABNORMAL LOW (ref 26.0–34.0)
MCHC: 32.7 g/dL (ref 30.0–36.0)
Neutrophils Relative %: 62 % (ref 43–77)
Platelets: 280 10*3/uL (ref 150–400)
RBC: 4.74 MIL/uL (ref 3.87–5.11)
WBC: 6.3 10*3/uL (ref 4.0–10.5)

## 2013-05-16 LAB — COMPREHENSIVE METABOLIC PANEL
ALT: 11 U/L (ref 0–35)
AST: 15 U/L (ref 0–37)
Albumin: 3.6 g/dL (ref 3.5–5.2)
Alkaline Phosphatase: 80 U/L (ref 39–117)
Creatinine, Ser: 0.78 mg/dL (ref 0.50–1.10)
Glucose, Bld: 91 mg/dL (ref 70–99)
Potassium: 3.8 mEq/L (ref 3.5–5.1)
Sodium: 136 mEq/L (ref 135–145)
Total Protein: 7.2 g/dL (ref 6.0–8.3)

## 2013-05-16 NOTE — Patient Instructions (Signed)
YOUR SURGERY IS SCHEDULED AT [facility]  ON:  Thursday  05/19/13  REPORT TO [facility] AT:  7:30 AM      PHONE # FOR SHORT STAY IS [phone]  DO NOT EAT OR DRINK ANYTHING AFTER MIDNIGHT THE NIGHT BEFORE YOUR SURGERY.  YOU MAY BRUSH YOUR TEETH, RINSE OUT YOUR MOUTH--BUT NO WATER, NO FOOD, NO CHEWING GUM, NO MINTS, NO CANDIES, NO CHEWING TOBACCO.  PLEASE TAKE THE FOLLOWING MEDICATIONS THE AM OF YOUR SURGERY WITH A FEW SIPS OF WATER:  NO MEDS TO TAKE    DO NOT BRING VALUABLES, MONEY, CREDIT CARDS.  DO NOT WEAR JEWELRY, MAKE-UP, NAIL POLISH AND NO METAL PINS OR CLIPS IN YOUR HAIR. CONTACT LENS, DENTURES / PARTIALS, GLASSES SHOULD NOT BE WORN TO SURGERY AND IN MOST CASES-HEARING AIDS WILL NEED TO BE REMOVED.  BRING YOUR GLASSES CASE, ANY EQUIPMENT NEEDED FOR YOUR CONTACT LENS. FOR PATIENTS ADMITTED TO THE HOSPITAL--CHECK OUT TIME THE DAY OF DISCHARGE IS 11:00 AM.  ALL INPATIENT ROOMS ARE PRIVATE - WITH BATHROOM, TELEPHONE, TELEVISION AND WIFI INTERNET.  IF YOU ARE BEING DISCHARGED THE SAME DAY OF YOUR SURGERY--YOU CAN NOT DRIVE YOURSELF HOME--AND SHOULD NOT GO HOME ALONE BY TAXI OR BUS.  NO DRIVING OR OPERATING MACHINERY FOR 24 HOURS FOLLOWING ANESTHESIA / PAIN MEDICATIONS.  PLEASE MAKE ARRANGEMENTS FOR SOMEONE TO BE WITH YOU AT HOME THE FIRST 24 HOURS AFTER SURGERY. RESPONSIBLE DRIVER'S NAME   [name] - pt's mother                                               PHONE #  [phone]                            FAILURE TO FOLLOW THESE INSTRUCTIONS MAY RESULT IN THE CANCELLATION OF YOUR SURGERY.   PATIENT SIGNATURE_________________________________

## 2013-05-16 NOTE — Pre-Procedure Instructions (Signed)
CXR REPORT IN EPIC FROM 12/03/12. EKG WAS DONE TODAY AT [facility] AS PER ANESTHESIOLOGIST'S GUIDELINES

## 2013-05-19 ENCOUNTER — Observation Stay (HOSPITAL_COMMUNITY)
Admission: RE | Admit: 2013-05-19 | Discharge: 2013-05-21 | Disposition: A | Discharge status: Home / Self Care | Payer: 59 | Source: Ambulatory Visit | Attending: General Surgery | Admitting: General Surgery

## 2013-05-19 ENCOUNTER — Encounter (HOSPITAL_COMMUNITY)
Admission: RE | Disposition: A | Discharge status: Home / Self Care | Payer: Self-pay | Source: Ambulatory Visit | Attending: General Surgery

## 2013-05-19 ENCOUNTER — Ambulatory Visit (HOSPITAL_COMMUNITY): Payer: 59 | Admitting: Anesthesiology

## 2013-05-19 ENCOUNTER — Encounter (HOSPITAL_COMMUNITY): Payer: Self-pay | Admitting: *Deleted

## 2013-05-19 ENCOUNTER — Ambulatory Visit (HOSPITAL_COMMUNITY): Payer: 59

## 2013-05-19 ENCOUNTER — Encounter (HOSPITAL_COMMUNITY): Payer: 59 | Admitting: Anesthesiology

## 2013-05-19 DIAGNOSIS — K8010 Calculus of gallbladder with chronic cholecystitis without obstruction: Secondary | ICD-10-CM

## 2013-05-19 DIAGNOSIS — K219 Gastro-esophageal reflux disease without esophagitis: Secondary | ICD-10-CM | POA: Insufficient documentation

## 2013-05-19 DIAGNOSIS — R7402 Elevation of levels of lactic acid dehydrogenase (LDH): Secondary | ICD-10-CM | POA: Insufficient documentation

## 2013-05-19 DIAGNOSIS — R7401 Elevation of levels of liver transaminase levels: Secondary | ICD-10-CM | POA: Insufficient documentation

## 2013-05-19 DIAGNOSIS — Z01810 Encounter for preprocedural cardiovascular examination: Secondary | ICD-10-CM | POA: Insufficient documentation

## 2013-05-19 DIAGNOSIS — K8020 Calculus of gallbladder without cholecystitis without obstruction: Secondary | ICD-10-CM

## 2013-05-19 DIAGNOSIS — Z01812 Encounter for preprocedural laboratory examination: Secondary | ICD-10-CM | POA: Insufficient documentation

## 2013-05-19 HISTORY — PX: CHOLECYSTECTOMY: SHX55

## 2013-05-19 SURGERY — LAPAROSCOPIC CHOLECYSTECTOMY WITH INTRAOPERATIVE CHOLANGIOGRAM
Anesthesia: General | Site: Abdomen | Wound class: Clean Contaminated

## 2013-05-19 MED ORDER — ONDANSETRON HCL 4 MG/2ML IJ SOLN
4.0000 mg | Freq: Four times a day (QID) | INTRAMUSCULAR | Status: DC | PRN
  Administered 2013-05-21: 4 mg via INTRAVENOUS
  Filled 2013-05-19: qty 2

## 2013-05-19 MED ORDER — CEFAZOLIN SODIUM-DEXTROSE 2-3 GM-% IV SOLR
2.0000 g | INTRAVENOUS | Status: AC
  Administered 2013-05-19: 2 g via INTRAVENOUS

## 2013-05-19 MED ORDER — MIDAZOLAM HCL 5 MG/5ML IJ SOLN
INTRAMUSCULAR | Status: DC | PRN
  Administered 2013-05-19: 2 mg via INTRAVENOUS

## 2013-05-19 MED ORDER — LACTATED RINGERS IV SOLN: INTRAVENOUS | Status: DC

## 2013-05-19 MED ORDER — PROPOFOL 10 MG/ML IV BOLUS
INTRAVENOUS | Status: DC | PRN
  Administered 2013-05-19: 200 mg via INTRAVENOUS

## 2013-05-19 MED ORDER — ROCURONIUM BROMIDE 100 MG/10ML IV SOLN
INTRAVENOUS | Status: DC | PRN
  Administered 2013-05-19: 25 mg via INTRAVENOUS
  Administered 2013-05-19: 10 mg via INTRAVENOUS
  Administered 2013-05-19: 5 mg via INTRAVENOUS

## 2013-05-19 MED ORDER — LIDOCAINE HCL (CARDIAC) 20 MG/ML IV SOLN
INTRAVENOUS | Status: DC | PRN
  Administered 2013-05-19: 50 mg via INTRAVENOUS

## 2013-05-19 MED ORDER — LIP MEDEX EX OINT
TOPICAL_OINTMENT | CUTANEOUS | Status: AC
  Administered 2013-05-19: 13:00:00
  Filled 2013-05-19: qty 7

## 2013-05-19 MED ORDER — NEOSTIGMINE METHYLSULFATE 1 MG/ML IJ SOLN
INTRAMUSCULAR | Status: DC | PRN
  Administered 2013-05-19: 4 mg via INTRAVENOUS

## 2013-05-19 MED ORDER — HYDROMORPHONE HCL PF 1 MG/ML IJ SOLN
0.2500 mg | INTRAMUSCULAR | Status: DC | PRN
  Administered 2013-05-19 (×5): 0.5 mg via INTRAVENOUS

## 2013-05-19 MED ORDER — IOHEXOL 300 MG/ML  SOLN
INTRAMUSCULAR | Status: DC | PRN
  Administered 2013-05-19: 6 mL via INTRAVENOUS

## 2013-05-19 MED ORDER — DEXAMETHASONE SODIUM PHOSPHATE 10 MG/ML IJ SOLN
INTRAMUSCULAR | Status: DC | PRN
  Administered 2013-05-19: 10 mg via INTRAVENOUS

## 2013-05-19 MED ORDER — LACTATED RINGERS IR SOLN
Status: DC | PRN
  Administered 2013-05-19: 1000 mL

## 2013-05-19 MED ORDER — CEFAZOLIN SODIUM-DEXTROSE 2-3 GM-% IV SOLR
INTRAVENOUS | Status: AC
  Filled 2013-05-19: qty 50

## 2013-05-19 MED ORDER — HYDROMORPHONE HCL PF 1 MG/ML IJ SOLN
INTRAMUSCULAR | Status: AC
  Filled 2013-05-19: qty 1

## 2013-05-19 MED ORDER — SERTRALINE HCL 100 MG PO TABS
200.0000 mg | ORAL_TABLET | Freq: Every day | ORAL | Status: DC
Start: 2013-05-19 — End: 2013-05-21
  Administered 2013-05-20 (×2): 200 mg via ORAL
  Filled 2013-05-19 (×3): qty 2

## 2013-05-19 MED ORDER — PANTOPRAZOLE SODIUM 40 MG PO TBEC
40.0000 mg | DELAYED_RELEASE_TABLET | Freq: Every day | ORAL | Status: DC
  Administered 2013-05-20 (×2): 40 mg via ORAL
  Filled 2013-05-19 (×4): qty 1

## 2013-05-19 MED ORDER — TOPIRAMATE 100 MG PO TABS
100.0000 mg | ORAL_TABLET | Freq: Every day | ORAL | Status: DC
  Administered 2013-05-20 (×2): 100 mg via ORAL
  Filled 2013-05-19 (×3): qty 1

## 2013-05-19 MED ORDER — SODIUM CHLORIDE 0.9 % IR SOLN
Status: DC | PRN
  Administered 2013-05-19: 1000 mL

## 2013-05-19 MED ORDER — KCL IN DEXTROSE-NACL 20-5-0.45 MEQ/L-%-% IV SOLN
INTRAVENOUS | Status: AC
  Administered 2013-05-19: 12:00:00
  Filled 2013-05-19: qty 1000

## 2013-05-19 MED ORDER — FENTANYL CITRATE 0.05 MG/ML IJ SOLN
INTRAMUSCULAR | Status: DC | PRN
  Administered 2013-05-19 (×2): 50 ug via INTRAVENOUS
  Administered 2013-05-19: 100 ug via INTRAVENOUS
  Administered 2013-05-19: 50 ug via INTRAVENOUS

## 2013-05-19 MED ORDER — BUPIVACAINE-EPINEPHRINE PF 0.5-1:200000 % IJ SOLN
INTRAMUSCULAR | Status: AC
  Filled 2013-05-19: qty 30

## 2013-05-19 MED ORDER — SUCCINYLCHOLINE CHLORIDE 20 MG/ML IJ SOLN
INTRAMUSCULAR | Status: DC | PRN
  Administered 2013-05-19: 100 mg via INTRAVENOUS

## 2013-05-19 MED ORDER — BUPIVACAINE HCL (PF) 0.25 % IJ SOLN
INTRAMUSCULAR | Status: DC | PRN
  Administered 2013-05-19: 20 mL

## 2013-05-19 MED ORDER — GLYCOPYRROLATE 0.2 MG/ML IJ SOLN
INTRAMUSCULAR | Status: DC | PRN
  Administered 2013-05-19: 0.6 mg via INTRAVENOUS

## 2013-05-19 MED ORDER — MUPIROCIN 2 % EX OINT
1.0000 "application " | TOPICAL_OINTMENT | Freq: Two times a day (BID) | CUTANEOUS | Status: DC
  Administered 2013-05-19 – 2013-05-21 (×5): 1 via NASAL
  Filled 2013-05-19: qty 22

## 2013-05-19 MED ORDER — PROMETHAZINE HCL 25 MG/ML IJ SOLN
6.2500 mg | INTRAMUSCULAR | Status: DC | PRN
  Administered 2013-05-19: 6.25 mg via INTRAVENOUS

## 2013-05-19 MED ORDER — HYDROCODONE-ACETAMINOPHEN 5-325 MG PO TABS
1.0000 | ORAL_TABLET | ORAL | Status: DC | PRN
  Administered 2013-05-19 (×2): 1 via ORAL
  Administered 2013-05-20 (×2): 2 via ORAL
  Filled 2013-05-19: qty 2
  Filled 2013-05-19 (×2): qty 1
  Filled 2013-05-19: qty 2

## 2013-05-19 MED ORDER — KCL IN DEXTROSE-NACL 20-5-0.45 MEQ/L-%-% IV SOLN
INTRAVENOUS | Status: DC
  Administered 2013-05-19 – 2013-05-20 (×3): 125 mL/h via INTRAVENOUS
  Filled 2013-05-19 (×8): qty 1000

## 2013-05-19 MED ORDER — PROMETHAZINE HCL 25 MG/ML IJ SOLN
INTRAMUSCULAR | Status: AC
  Filled 2013-05-19: qty 1

## 2013-05-19 MED ORDER — ONDANSETRON HCL 4 MG PO TABS: 4.0000 mg | ORAL_TABLET | Freq: Four times a day (QID) | ORAL | Status: DC | PRN

## 2013-05-19 MED ORDER — ONDANSETRON HCL 4 MG/2ML IJ SOLN
INTRAMUSCULAR | Status: DC | PRN
  Administered 2013-05-19: 4 mg via INTRAVENOUS

## 2013-05-19 MED ORDER — LIDOCAINE HCL 1 % IJ SOLN
INTRAMUSCULAR | Status: AC
  Filled 2013-05-19: qty 20

## 2013-05-19 MED ORDER — LACTATED RINGERS IV SOLN
INTRAVENOUS | Status: DC | PRN
  Administered 2013-05-19 (×2): via INTRAVENOUS

## 2013-05-19 MED ORDER — LIDOCAINE HCL (PF) 1 % IJ SOLN
INTRAMUSCULAR | Status: DC | PRN
  Administered 2013-05-19: 20 mL via SUBCUTANEOUS

## 2013-05-19 MED ORDER — MORPHINE SULFATE 2 MG/ML IJ SOLN
2.0000 mg | INTRAMUSCULAR | Status: DC | PRN
  Administered 2013-05-19 – 2013-05-20 (×2): 2 mg via INTRAVENOUS
  Filled 2013-05-19 (×2): qty 1

## 2013-05-19 SURGICAL SUPPLY — 42 items
ADH SKN CLS APL DERMABOND .7 (GAUZE/BANDAGES/DRESSINGS) ×1
APPLIER CLIP LOGIC TI 5 (MISCELLANEOUS) IMPLANT
APPLIER CLIP ROT 10 11.4 M/L (STAPLE) ×2
APR CLP MED LRG 11.4X10 (STAPLE) ×1
APR CLP MED LRG 33X5 (MISCELLANEOUS)
BAG SPEC RTRVL LRG 6X4 10 (ENDOMECHANICALS) ×1
BLADE HEX COATED 2.75 (ELECTRODE) ×2 IMPLANT
CABLE HIGH FREQUENCY MONO STRZ (ELECTRODE) ×2 IMPLANT
CATH REDDICK CHOLANGI 4FR 50CM (CATHETERS) IMPLANT
CHLORAPREP W/TINT 26ML (MISCELLANEOUS) ×4 IMPLANT
CLIP APPLIE ROT 10 11.4 M/L (STAPLE) ×1 IMPLANT
DECANTER SPIKE VIAL GLASS SM (MISCELLANEOUS) ×2 IMPLANT
DERMABOND ADVANCED (GAUZE/BANDAGES/DRESSINGS) ×1
DERMABOND ADVANCED .7 DNX12 (GAUZE/BANDAGES/DRESSINGS) ×1 IMPLANT
DRAPE C-ARM 42X120 X-RAY (DRAPES) ×3 IMPLANT
DRAPE LAPAROSCOPIC ABDOMINAL (DRAPES) ×2 IMPLANT
DRAPE UTILITY XL STRL (DRAPES) ×2 IMPLANT
ELECT REM PT RETURN 9FT ADLT (ELECTROSURGICAL) ×2
ELECTRODE REM PT RTRN 9FT ADLT (ELECTROSURGICAL) ×1 IMPLANT
ENDOLOOP SUT PDS II  0 18 (SUTURE)
ENDOLOOP SUT PDS II 0 18 (SUTURE) IMPLANT
GLOVE SURG SS PI 7.5 STRL IVOR (GLOVE) ×4 IMPLANT
GOWN STRL REIN XL XLG (GOWN DISPOSABLE) ×6 IMPLANT
HEMOSTAT SURGICEL 4X8 (HEMOSTASIS) ×1 IMPLANT
KIT BASIN OR (CUSTOM PROCEDURE TRAY) ×2 IMPLANT
PENCIL BUTTON HOLSTER BLD 10FT (ELECTRODE) ×2 IMPLANT
POUCH SPECIMEN RETRIEVAL 10MM (ENDOMECHANICALS) ×2 IMPLANT
SCISSORS LAP 5X35 DISP (ENDOMECHANICALS) ×2 IMPLANT
SET CHOLANGIOGRAPH MIX (MISCELLANEOUS) ×2 IMPLANT
SET IRRIG TUBING LAPAROSCOPIC (IRRIGATION / IRRIGATOR) ×2 IMPLANT
SLEEVE XCEL OPT CAN 5 100 (ENDOMECHANICALS) ×2 IMPLANT
SUT MNCRL AB 4-0 PS2 18 (SUTURE) ×4 IMPLANT
SUT VICRYL 0 ENDOLOOP (SUTURE) ×1 IMPLANT
SUT VICRYL 0 UR6 27IN ABS (SUTURE) ×2 IMPLANT
SYR 20CC LL (SYRINGE) ×2 IMPLANT
TOWEL OR 17X26 10 PK STRL BLUE (TOWEL DISPOSABLE) ×2 IMPLANT
TOWEL OR NON WOVEN STRL DISP B (DISPOSABLE) ×2 IMPLANT
TRAY LAP CHOLE (CUSTOM PROCEDURE TRAY) ×2 IMPLANT
TROCAR BALLN 12MMX100 BLUNT (TROCAR) ×2 IMPLANT
TROCAR BLADELESS OPT 5 100 (ENDOMECHANICALS) ×2 IMPLANT
TROCAR XCEL NON-BLD 11X100MML (ENDOMECHANICALS) ×2 IMPLANT
TUBING INSUFFLATION 10FT LAP (TUBING) ×2 IMPLANT

## 2013-05-19 NOTE — Interval H&P Note (Signed)
History and Physical Interval Note:  05/19/2013 8:47 AM  [name]  has presented today for surgery, with the diagnosis of gallstones  The various methods of treatment have been discussed with the patient and family. After consideration of risks, benefits and other options for treatment, the patient has consented to  Procedure(s): LAPAROSCOPIC CHOLECYSTECTOMY WITH INTRAOPERATIVE CHOLANGIOGRAM (N/A) as a surgical intervention .  The patient's history has been reviewed, patient examined, no change in status, stable for surgery.  I have reviewed the patient's chart and labs.  Questions were answered to the patient's satisfaction.  She was seen and evaluated in the preop area.  Risks of the procedure again discussed with her.  The risks of infection, bleeding, pain, persistent symptoms, scarring, injury to bowel or bile ducts, retained stone, diarrhea, need for additional procedures, and need for open surgery discussed with the patient.  She wants to proceed with lap chole, IOC    [name]

## 2013-05-19 NOTE — Brief Op Note (Signed)
05/19/2013  10:50 AM  PATIENT:  Yvonne Banks  42 y.o. female  PRE-OPERATIVE DIAGNOSIS:  gallstones  POST-OPERATIVE DIAGNOSIS:  gallstones  PROCEDURE:  Procedure(s): LAPAROSCOPIC CHOLECYSTECTOMY WITH INTRAOPERATIVE CHOLANGIOGRAM (N/A)  SURGEON:  Surgeon(s) and Role:    * Lodema Pilot, DO - Primary  PHYSICIAN ASSISTANT:   ASSISTANTS: none   ANESTHESIA:   general  EBL:  Total I/O In: 1650 [I.V.:1650] Out: -   BLOOD ADMINISTERED:none  DRAINS: none   LOCAL MEDICATIONS USED:  MARCAINE    and LIDOCAINE   SPECIMEN:  Source of Specimen:  gallbladder  DISPOSITION OF SPECIMEN:  PATHOLOGY  COUNTS:  YES  TOURNIQUET:  * No tourniquets in log *  DICTATION: .Other Dictation: Dictation Number [id number]  PLAN OF CARE: Admit for overnight observation  PATIENT DISPOSITION:  PACU - hemodynamically stable.   Delay start of Pharmacological VTE agent (>24hrs) due to surgical blood loss or risk of bleeding: no

## 2013-05-19 NOTE — Anesthesia Postprocedure Evaluation (Signed)
  Anesthesia Post-op Note  Patient: Yvonne Banks  Procedure(s) Performed: Procedure(s) (LRB): LAPAROSCOPIC CHOLECYSTECTOMY WITH INTRAOPERATIVE CHOLANGIOGRAM (N/A)  Patient Location: PACU  Anesthesia Type: General  Level of Consciousness: awake and alert   Airway and Oxygen Therapy: Patient Spontanous Breathing  Post-op Pain: mild  Post-op Assessment: Post-op Vital signs reviewed, Patient's Cardiovascular Status Stable, Respiratory Function Stable, Patent Airway and No signs of Nausea or vomiting  Last Vitals:  Filed Vitals:   05/19/13 1159  BP: 131/80  Pulse: 77  Temp: 36.6 C  Resp: 16    Post-op Vital Signs: stable   Complications: No apparent anesthesia complications

## 2013-05-19 NOTE — Consult Note (Signed)
[facility] Consultation Note  Referring Provider: Dr. Lodema Pilot ([facility]) Primary Care Physician:  Emeterio Reeve, MD  Reason for Consultation:  Abnormal intraoperative cholangiogram.  HPI: Yvonne Banks is a 42 y.o. female who underwent cholecystectomy today for symptomatic cholelithiasis.  She had intraoperative cholangiogram which showed possible stone versus air bubble in bile duct.  Most recent LFTs normal.  She has the expected post-operative abdominal tenderness and discomfort.   Past Medical History  Diagnosis Date  . IBS (irritable bowel syndrome)   . PMS (premenstrual syndrome)   . Anxiety   . Herpes genitalis in women   . Allergy   . Insomnia   . Migraines   . Vitamin D deficiency   . Anemia menometrorrhaghia  . MRSA (methicillin resistant staph aureus) culture positive     skin infection7/2013   PT STATES NO INFECTION AT PRESENT AND SHE IS FOLLOWING HER DOCTOR'S ORDERS TO USE MUPIROCIN  OINTMENT BID IN NOSE FIRST 5 DAYS OF EACH MONTH THRU DEC 2014 AND HAS HIBICLENS TO WASH THE AREAS WHERE THE INFECTION HAS BEEN.  . Fibroids   . Thyromegaly   . PMS (premenstrual syndrome)   . History of chlamydia   . GERD (gastroesophageal reflux disease)     treatment with diet - no meds  . Shortness of breath     with exertion  - pt feels weight related  . Bilateral knee pain   . Complication of anesthesia     woke up coughing after surgery May 2014 - required inhaler in recovery and for a week after that surgery--pt thinks it was Symbicort  . Gallstones     causing nausea and and midchest -abdominal pain    Past Surgical History  Procedure Laterality Date  . Wisdom tooth extraction    . Dilatation & currettage/hysteroscopy with resectocope N/A 11/05/2012    Procedure: DILATATION & CURETTAGE/HYSTEROSCOPY WITH RESECTOCOPE;  Surgeon: Purcell Nails, MD;  Location: [facility];  Service: Gynecology;  Laterality: N/A;  Christean Grief iud  01-14-13    Prior to Admission  medications   Medication Sig Start Date End Date Taking? Authorizing Provider  cetirizine (ZYRTEC) 10 MG tablet Take 10 mg by mouth daily as needed.    Yes Historical Provider, MD  chlorhexidine (HIBICLENS) 4 % external liquid Apply 1 application topically daily as needed (use for 1st 5 days of the month for next 6 months).   Yes Historical Provider, MD  Fe Fum-FePoly-Vit C-Vit B3 (INTEGRA PO) Take 1 tablet by mouth at bedtime.   Yes Historical Provider, MD  ketorolac (TORADOL) 10 MG tablet Take 10 mg by mouth every 6 (six) hours as needed for pain.  03/27/13  Yes Historical Provider, MD  mupirocin nasal ointment (BACTROBAN) 2 % Place 1 application into the nose 2 (two) times daily. Use twice a day for the first 5 days in each month for 6 months. Last month to take December   Yes Historical Provider, MD  omeprazole (PRILOSEC OTC) 20 MG tablet Take 40 mg by mouth at bedtime.   Yes Historical Provider, MD  sertraline (ZOLOFT) 100 MG tablet 200 mg at bedtime.  05/10/12  Yes Historical Provider, MD  topiramate (TOPAMAX) 100 MG tablet Take 100 mg by mouth at bedtime.   Yes Historical Provider, MD  Vitamin D, Ergocalciferol, (DRISDOL) 50000 UNITS CAPS capsule Take 50,000 Units by mouth every 7 (seven) days.  04/21/13  Yes Historical Provider, MD  acyclovir (ZOVIRAX) 400 MG tablet Take 400 mg by  mouth 4 (four) times daily as needed. For breakouts    Historical Provider, MD  hyoscyamine (LEVSIN, ANASPAZ) 0.125 MG tablet Take 0.125 mg by mouth every 6 (six) hours as needed for cramping.  03/30/13   Historical Provider, MD  ibuprofen (ADVIL,MOTRIN) 600 MG tablet Take 1 tablet (600 mg total) by mouth every 6 (six) hours as needed for pain. 12/03/12   Purcell Nails, MD  Levonorgestrel (SKYLA) 13.5 MG IUD by Intrauterine route continuous.    Historical Provider, MD    Current Facility-Administered Medications  Medication Dose Route Frequency Provider Last Rate Last Dose  . dextrose 5 % and 0.45 % NaCl with KCl  20 mEq/L infusion   Intravenous Continuous Lodema Pilot, DO      . HYDROcodone-acetaminophen (NORCO/VICODIN) 5-325 MG per tablet 1-2 tablet  1-2 tablet Oral Q4H PRN Lodema Pilot, DO      . lactated ringers infusion   Intravenous Continuous Delphia Grates, CRNA      . morphine 2 MG/ML injection 2 mg  2 mg Intravenous Q1H PRN Lodema Pilot, DO   2 mg at 05/19/13 1248  . mupirocin ointment (BACTROBAN) 2 % 1 application  1 application Nasal BID Lodema Pilot, DO   1 application at 05/19/13 1635  . ondansetron (ZOFRAN) tablet 4 mg  4 mg Oral Q6H PRN Lodema Pilot, DO       Or  . ondansetron [facility]) injection 4 mg  4 mg Intravenous Q6H PRN Lodema Pilot, DO      . pantoprazole (PROTONIX) EC tablet 40 mg  40 mg Oral QHS Lodema Pilot, DO      . sertraline (ZOLOFT) tablet 200 mg  200 mg Oral QHS Lodema Pilot, DO      . topiramate (TOPAMAX) tablet 100 mg  100 mg Oral QHS Lodema Pilot, DO        Allergies as of 05/05/2013 - Review Complete 05/05/2013  Allergen Reaction Noted  . Erythromycin  06/28/2012  . Iron  06/28/2012    Family History  Problem Relation Age of Onset  . Anemia Mother   . Hypertension Mother   . Ulcers Sister   . Cancer Maternal Grandmother     colon    History   Social History  . Marital Status: Single    Spouse Name: N/A    Number of Children: N/A  . Years of Education: N/A   Occupational History  . Not on file.   Social History Main Topics  . Smoking status: Never Smoker   . Smokeless tobacco: Never Used  . Alcohol Use: No  . Drug Use: No  . Sexual Activity: No     Comment: DEPO PROVERA   Other Topics Concern  . Not on file   Social History Narrative  . No narrative on file    Review of Systems: As per HPI, all others negative  Physical Exam: Vital signs in last 24 hours: Temp:  [97.8 F (36.6 C)-98.6 F (37 C)] 98.6 F (37 C) (10/30 1455) Pulse Rate:  [70-94] 90 (10/30 1455) Resp:  [16-21] 17 (10/30 1455) BP: (114-142)/(51-80) 122/64 mmHg  (10/30 1455) SpO2:  [98 %-100 %] 100 % (10/30 1455) Weight:  [103.148 kg (227 lb 6.4 oz)] 103.148 kg (227 lb 6.4 oz) (10/30 1300) Last BM Date: 05/19/13 General:   Alert,  Well-developed, well-nourished, pleasant and cooperative in NAD Head:  Normocephalic and atraumatic. Eyes:  Sclera clear, no icterus.   Conjunctiva pink. Ears:  Normal auditory acuity. Nose:  No deformity, discharge,  or lesions. Mouth:  No deformity or lesions.  Oropharynx pink & moist. Neck:  Supple; no masses or thyromegaly. Lungs:  Clear throughout to auscultation.   No wheezes, crackles, or rhonchi. No acute distress. Heart:  Regular rate and rhythm; no murmurs, clicks, rubs,  or gallops. Abdomen:  Soft, mild distended and mild post-operative tenderness without peritonitis; No masses, hepatosplenomegaly or hernias noted. Normal bowel sounds, without guarding, and without rebound.     Msk:  Symmetrical without gross deformities. Normal posture. Pulses:  Normal pulses noted. Extremities:  Without clubbing or edema. Neurologic:  Alert and  oriented x4;  Diffusely weak, otherwise grossly normal neurologically. Skin:  Intact without significant lesions or rashes.. Psych:  Alert and cooperative. Normal mood and affect.   Lab Results: No results found for this basename: WBC, HGB, HCT, PLT,  in the last 72 hours BMET No results found for this basename: NA, K, CL, CO2, GLUCOSE, BUN, CREATININE, CALCIUM,  in the last 72 hours LFT No results found for this basename: PROT, ALBUMIN, AST, ALT, ALKPHOS, BILITOT, BILIDIR, IBILI,  in the last 72 hours PT/INR No results found for this basename: LABPROT, INR,  in the last 72 hours  Studies/Results: Dg Cholangiogram Operative  05/19/2013   CLINICAL DATA:  cholelithiasis, status post laparoscopic cholecystectomy  EXAM: INTRAOPERATIVE CHOLANGIOGRAM  TECHNIQUE: Cholangiographic images from the C-arm fluoroscopic device were submitted for interpretation post-operatively. Please see  the procedural report for the amount of contrast and the fluoroscopy time utilized.  COMPARISON:  04/28/2013  FINDINGS: Intraoperative cholangiogram performed following the laparoscopic cholecystectomy. Intrahepatic biliary ducts, biliary confluence, common hepatic duct, cystic duct, a and common bile that all appear patent. Small round mobile filling defects noted initially which upon further injection advanced into a left hepatic duct, suspect injected air bubble.  IMPRESSION: Patent biliary system, biliary tree mobile filling defects suspect air bubbles.   Electronically Signed   By: Ruel Favors M.D.   On: 05/19/2013 11:39    Impression:  1.  Abnormal intraoperative cholangiogram, with elevated LFTs.  Suspect air bubble.  Choledocholithiasis felt less likely, but not definitively excluded. 2.  Cholelithiasis post-operative day 0 cholecystectomy by Dr. Biagio Quint.  Plan:  1.  I have discussed with Dr. Biagio Quint.  Will follow LFTs tomorrow; if LFTs tomorrow are normal, or only minimally elevated, will consider discharge home with outpatient follow-up; if LFTs dramatically increased, would consider MRCP versus ERCP. 2.  Clear liquid diet tonight ok. 3.  [facility] to reassess in the morning.   LOS: 0 days   Ajwa Kimberley M  05/19/2013, 4:41 PM

## 2013-05-19 NOTE — Transfer of Care (Signed)
Immediate Anesthesia Transfer of Care Note  Patient: Yvonne Banks  Procedure(s) Performed: Procedure(s): LAPAROSCOPIC CHOLECYSTECTOMY WITH INTRAOPERATIVE CHOLANGIOGRAM (N/A)  Patient Location: PACU  Anesthesia Type:General  Level of Consciousness: awake, alert  and oriented  Airway & Oxygen Therapy: Patient Spontanous Breathing and Patient connected to face mask oxygen  Post-op Assessment: Report given to PACU RN and Post -op Vital signs reviewed and stable  Post vital signs: Reviewed and stable  Complications: No apparent anesthesia complications

## 2013-05-19 NOTE — Anesthesia Preprocedure Evaluation (Addendum)
Anesthesia Evaluation  Patient identified by MRN, date of birth, ID band Patient awake  General Assessment Comment:Difficulty breathing after LMA general at [facility] in May 2014 requiring inhalers.  Reviewed: Allergy & Precautions, H&P , NPO status , Patient's Chart, lab work & pertinent test results  History of Anesthesia Complications (+) history of anesthetic complications  Airway Mallampati: II TM Distance: >3 FB Neck ROM: Full    Dental no notable dental hx.    Pulmonary  She denies current SOB. breath sounds clear to auscultation  Pulmonary exam normal       Cardiovascular negative cardio ROS  Rhythm:Regular Rate:Normal     Neuro/Psych  Headaches, PSYCHIATRIC DISORDERS Anxiety Depression    GI/Hepatic Neg liver ROS, GERD-  Medicated,  Endo/Other  negative endocrine ROS  Renal/GU negative Renal ROS  negative genitourinary   Musculoskeletal negative musculoskeletal ROS (+)   Abdominal (+) + obese,   Peds negative pediatric ROS (+)  Hematology negative hematology ROS (+)   Anesthesia Other Findings   Reproductive/Obstetrics negative OB ROS                          Anesthesia Physical Anesthesia Plan  ASA: II  Anesthesia Plan: General   Post-op Pain Management:    Induction: Intravenous  Airway Management Planned: Oral ETT  Additional Equipment:   Intra-op Plan:   Post-operative Plan: Extubation in OR  Informed Consent: I have reviewed the patients History and Physical, chart, labs and discussed the procedure including the risks, benefits and alternatives for the proposed anesthesia with the patient or authorized representative who has indicated his/her understanding and acceptance.   Dental advisory given  Plan Discussed with: CRNA  Anesthesia Plan Comments:         Anesthesia Quick Evaluation

## 2013-05-19 NOTE — H&P (View-Only) (Signed)
Patient ID: Yvonne Banks, female   DOB: [birth year], 42 y.o.   MRN: [id number]  Chief Complaint  Patient presents with  . Abdominal Pain    gallbladder    HPI Yvonne Banks is a 42 y.o. female.  This patient is referred by Dr. Paulino Yvonne for evaluation of symptomatically lithiasis. She has not had any issues until about 4 weeks ago when she began having epigastric and retrosternal chest pain which radiated down to her abdomen and through to her back. Since then she has continued to have some ongoing symptoms and she does note some association with food although not a saline with fatty foods. She says that it is a pretty constant pain but certainly does have episodes of more intense severity. She has associated nausea but no vomiting and denies any fevers or chills. She says that she has tried Hycosamine without any improvement.  Prilosec has helped to some but she continues to have symptoms despite Prilosec. She does have a history of irritable bowel as well as gastritis and she has also had a history of EGD and colonoscopy for evaluation of her irritable bowel syndrome in 2008. She does have an ultrasound which was consistent with cholelithiasis.   She says that she has not had any blood in her stool or melena although her stools have been fairly loose. HPI  Past Medical History  Diagnosis Date  . IBS (irritable bowel syndrome)   . PMS (premenstrual syndrome)   . Anxiety   . Herpes genitalis in women   . Allergy   . Insomnia   . Migraines   . Vitamin D deficiency   . Anemia menometrorrhaghia  . MRSA (methicillin resistant staph aureus) culture positive     skin infection7/2013  . Fibroids   . Thyromegaly   . PMS (premenstrual syndrome)   . History of chlamydia   . GERD (gastroesophageal reflux disease)     treatment with diet - no meds    Past Surgical History  Procedure Laterality Date  . Wisdom tooth extraction    . Dilatation & currettage/hysteroscopy with resectocope N/A  11/05/2012    Procedure: DILATATION & CURETTAGE/HYSTEROSCOPY WITH RESECTOCOPE;  Surgeon: Purcell Nails, MD;  Location: [facility];  Service: Gynecology;  Laterality: N/A;    Family History  Problem Relation Age of Onset  . Anemia Mother   . Hypertension Mother   . Ulcers Sister   . Cancer Maternal Grandmother     colon    Social History History  Substance Use Topics  . Smoking status: Never Smoker   . Smokeless tobacco: Never Used  . Alcohol Use: No    Allergies  Allergen Reactions  . Erythromycin     vomiting  . Iron     Stomach pain. Pt can take Integra for iron    Current Outpatient Prescriptions  Medication Sig Dispense Refill  . acyclovir (ZOVIRAX) 400 MG tablet Take 400 mg by mouth 4 (four) times daily as needed. For breakouts      . cetirizine (ZYRTEC) 10 MG tablet Take 10 mg by mouth daily.      . chlorhexidine (HIBICLENS) 4 % external liquid Apply 1 application topically daily as needed (use for 1st 5 days of the month for next 6 months).      . clonazePAM (KLONOPIN) 0.5 MG tablet       . HYDROcodone-acetaminophen (NORCO/VICODIN) 5-325 MG per tablet Take 1-2 tablets by mouth every 6 (six) hours as needed for pain.      Marland Kitchen  Hydrocodone-Acetaminophen 5-300 MG TABS Take 1 tablet by mouth every 6 (six) hours as needed.  30 each  0  . hyoscyamine (LEVSIN, ANASPAZ) 0.125 MG tablet       . ibuprofen (ADVIL,MOTRIN) 600 MG tablet Take 1 tablet (600 mg total) by mouth every 6 (six) hours as needed for pain.  30 tablet  0  . ketorolac (TORADOL) 10 MG tablet       . mupirocin nasal ointment (BACTROBAN) 2 % Place 1 application into the nose 2 (two) times daily. Use one-half of tube in each nostril twice daily for five (5) days. After application, press sides of nose together and gently massage. use for the first 5 days of each month for the next 6 months. Started in May 2014      . norethindrone (MICRONOR,CAMILA,ERRIN) 0.35 MG tablet Take 1 tablet (0.35 mg total) by mouth daily.  1  Package  11  . ranitidine (ZANTAC) 75 MG tablet Take 75 mg by mouth at bedtime as needed for heartburn.      . sertraline (ZOLOFT) 100 MG tablet 200 mg at bedtime.       . topiramate (TOPAMAX) 100 MG tablet Take 100 mg by mouth at bedtime.      . Vitamin D, Ergocalciferol, (DRISDOL) 50000 UNITS CAPS capsule        No current facility-administered medications for this visit.    Review of Systems Review of Systems All other review of systems negative or noncontributory except as stated in the HPI  Blood pressure 121/80, pulse 76, temperature 98.6 F (37 C), temperature source Temporal, resp. rate 12, height 5\' 5"  (1.651 m), weight 229 lb 12.8 oz (104.237 kg).  Physical Exam Physical Exam Physical Exam  Nursing note and vitals reviewed. Constitutional: She is oriented to person, place, and time. She appears well-developed and well-nourished. No distress.  HENT:  Head: Normocephalic and atraumatic.  Mouth/Throat: No oropharyngeal exudate.  Eyes: Conjunctivae and EOM are normal. Pupils are equal, round, and reactive to light. Right eye exhibits no discharge. Left eye exhibits no discharge. No scleral icterus.  Neck: Normal range of motion. Neck supple. No tracheal deviation present.  Cardiovascular: Normal rate, regular rhythm, normal heart sounds and intact distal pulses.   Pulmonary/Chest: Effort normal and breath sounds normal. No stridor. No respiratory distress. She has no wheezes.  Abdominal: Soft. Bowel sounds are normal. She exhibits no distension and no mass. There is no tenderness. There is no rebound and no guarding.  Musculoskeletal: Normal range of motion. She exhibits no edema and no tenderness.  Neurological: She is alert and oriented to person, place, and time.  Skin: Skin is warm and dry. No rash noted. She is not diaphoretic. No erythema. No pallor.  Psychiatric: She has a normal mood and affect. Her behavior is normal. Judgment and thought content normal.    Data  Reviewed [place], outside records  Assessment    Abdominal pain and cholelithiasis I think that there is a good chance that her symptoms are related to gallstones.  She does have some symptoms which are not entirely classic for symptomatic cholelithiasis but given her abdominal pain with radiation to her back and associated nausea and ultrasound findings consistent with cholelithiasis I think that we have enough evidence to offer her cholecystectomy. This could be related to gastritis or irritable bowel as well but she is already on treatments for these without any significant improvement. I did offer her cholecystectomy for possible treatment of her symptoms although  I did explain that one of the risks of the surgery would be persistent symptoms postoperatively. We also discussed the other risks of the surgery. The risks of infection, bleeding, pain, persistent symptoms, scarring, injury to bowel or bile ducts, retained stone, diarrhea, need for additional procedures, and need for open surgery discussed with the patient. She would like to proceed with cholecystectomy.      Plan    We will set her up for cholecystectomy when convenient.        Lodema Pilot DAVID 05/05/2013, 9:04 AM

## 2013-05-20 ENCOUNTER — Observation Stay (HOSPITAL_COMMUNITY): Payer: 59

## 2013-05-20 ENCOUNTER — Encounter (HOSPITAL_COMMUNITY): Payer: Self-pay | Admitting: General Surgery

## 2013-05-20 LAB — COMPREHENSIVE METABOLIC PANEL
ALT: 58 U/L — ABNORMAL HIGH (ref 0–35)
AST: 46 U/L — ABNORMAL HIGH (ref 0–37)
Alkaline Phosphatase: 88 U/L (ref 39–117)
CO2: 20 mEq/L (ref 19–32)
Calcium: 8.8 mg/dL (ref 8.4–10.5)
Chloride: 109 mEq/L (ref 96–112)
GFR calc Af Amer: >90 mL/min (ref >90)
GFR calc non Af Amer: >90 mL/min (ref >90)
Glucose, Bld: 123 mg/dL — ABNORMAL HIGH (ref 70–99)
Potassium: 3.9 mEq/L (ref 3.5–5.1)
Sodium: 136 mEq/L (ref 135–145)
Total Bilirubin: 0.4 mg/dL (ref 0.3–1.2)

## 2013-05-20 LAB — CBC
Hemoglobin: 10.6 g/dL — ABNORMAL LOW (ref 12.0–15.0)
MCH: 26 pg (ref 26.0–34.0)
MCHC: 32.8 g/dL (ref 30.0–36.0)
Platelets: 287 10*3/uL (ref 150–400)
RDW: 17.9 % — ABNORMAL HIGH (ref 11.5–15.5)

## 2013-05-20 MED ORDER — GADOBENATE DIMEGLUMINE 529 MG/ML IV SOLN
20.0000 mL | Freq: Once | INTRAVENOUS | Status: AC | PRN
  Administered 2013-05-20: 20 mL via INTRAVENOUS

## 2013-05-20 NOTE — Progress Notes (Signed)
The patient is having some increased abdominal discomfort today, which seems to be related to physical activity, having been walking out in the halls.  Her lab work this morning shows a very mild elevation of transaminases, in the 50 range, whereas 4 days ago her liver chemistries were completely normal.  Discussion and recommendation: I think this patient is at low to intermediate probability for a common duct stone. Per yesterday's plan developed by Dr. Biagio Quint and Dr. Dulce Sellar, I think it is a toss-up whether to do an MRCP or not. I do not feel there is sufficient evidence to justify an ERCP at this time. The patient's mother indicates that, because of upcoming myomectomy surgery, they are in favor of trying to get things tied up sooner rather than later, so it might be reasonable to do an MRCP prior to discharge to help exclude common duct stones.  I have placed a call to Dr. Biagio Quint to discuss this.  Florencia Reasons, M.D. [phone]

## 2013-05-20 NOTE — Op Note (Signed)
Yvonne Banks, Yvonne Banks             ACCOUNT NO.:  [id number]  MEDICAL RECORD NO.:  [id number]  LOCATION:  1539                         FACILITY:  [facility]  PHYSICIAN:  Lodema Pilot, MD       DATE OF BIRTH:  [birth year]  DATE OF PROCEDURE:  05/19/2013 DATE OF DISCHARGE:                              OPERATIVE REPORT   PROCEDURE:  Laparoscopic cholecystectomy with intraoperative cholangiogram.  PREOPERATIVE DIAGNOSIS:  Cholelithiasis.  POSTOPERATIVE DIAGNOSIS:  Choledocholithiasis.  SURGEON:  Lodema Pilot, MD  ASSISTANT:  None.  ANESTHESIA:  General endotracheal anesthesia with 40 mL of 1% lidocaine with epinephrine and 0.25% Marcaine in a 50:50 mixture.  FLUIDS:  1500 mL crystalloid.  ESTIMATED BLOOD LOSS:  Minimal.  DRAINS:  None.  SPECIMENS:  Gallbladder and contents sent to Pathology for permanent sectioning.  COMPLICATIONS:  None apparent.  FINDINGS:  Normal-appearing gallbladder anatomy, no evidence of acute inflammation.  Common bile duct filling defects consistent with retained common bile duct stones.  INDICATION FOR PROCEDURE:  Ms. Yvonne Banks is a 42 year old female with chronic nausea, abdominal pain, and known gallstones.  She has had otherwise negative work up.  OPERATIVE DETAILS:  Ms. Washington was seen and evaluated in the preoperative area and risks and benefits of procedure were again discussed in lay terms.  Informed consent was obtained.  She was taken to the operating room and placed on the table in supine position. General endotracheal tube anesthesia was obtained.  Her abdomen was prepped and draped in a standard surgical fashion.  Procedure time-out was performed with all operative team members confirmed proper patient and procedure.  A supraumbilical midline incision was made in the skin. Dissection was carried down through subcutaneous tissue using blunt dissection.  Abdominal wall fascia was elevated and sharply incised. The peritoneum entered  bluntly.  A 12-mm balloon port was placed at the umbilicus, and pneumoperitoneum was obtained.  Laparoscope was introduced and there was no evidence of bleeding or bowel injury upon entry.  Two 5-mm right upper quadrant ports were placed under direct visualization and an 11-mm epigastric port was placed under direct visualization.  The gallbladder was retracted cephalad.  It was soft and without any evidence of acute cholecystitis and took down the peritoneal attachments using blunt dissection.  She had a very long gallbladder, but tapered down to a single cystic duct.  I skeletonized the triangle of Calot, visualizing the single cystic duct entering the gallbladder and the liver parenchyma was visible through the triangle of Calot.  A single cystic artery was also seen coursing on the gallbladder.  Clip was placed on the gallbladder side of the duct and a small cystic ductotomy was made.  On look back, small gallstones out of the cystic duct stump and passed the cholangiogram catheter through the abdominal wall through a separate stab incision.  This was placed into the cystic duct stump and the catheter was clipped into place.  Cholangiogram was performed which demonstrated normal gallbladder anatomy and free flow of bile into the duodenum, although there were several filling defects which were mobile within the common bile duct and left hepatic duct.  I positioned the patient in head-down position to see if these  were able to or that they might flow down through to the duodenum and they persisted in the hepatic ducts and these were concerning for gallstones. The catheter was removed and I tried to milk back some additional stones and no other stones were retrieved.  Two clips were placed on the cystic duct stump and the duct was divided.  The artery was clipped and divided as well.  The gallbladder was then removed from the gallbladder fossa using Bovie electrocautery.  She actually had  a fairly long mesentery and the removal of the gallbladder fossa was actually fairly easy.  The gallbladder was completely removed from the gallbladder fossa and was not entered during the dissection.  We did place a small piece of Surgicel gauze in a cystic duct stump, although there was no evidence of active bleeding.  I removed the gallbladder from the abdomen and an EndoCatch bag through the umbilical trocar site.  She had several gallstones palpable and the gallbladder was sent to Pathology.  The abdomen was then irrigated with sterile saline solution until the irrigation returned clear.  The right upper quadrant trocar was removed under direct visualization.  The abdominal wall was noted to be hemostatic.  The umbilical trocar was removed and the umbilical fascia was approximated with interrupted 0 Vicryl sutures in open fashion.  The sutures were secured and the abdomen was re-insufflated with carbon dioxide gas through the epigastric trocar site.  The abdominal wall closure was noted be adequate without any evidence of bleeding or bowel injury, and the abdominal wall and the right upper quadrant appeared hemostatic with no evidence of bowel injury.  The final trocar was removed and the wounds were injected with a total of 40 mL of 1% lidocaine with epinephrine and 0.25% Marcaine in a 50:50 mixture.  The skin edges were approximated with 4-0 Monocryl subcuticular suture and the skin was washed and dried and Dermabond was applied.  All sponge, needle, and instrument counts were correct at the end of the case.  The patient tolerated the procedure well without apparent complications. The plan was to transfer to the recovery and overnight admission and consult with Gastroenterology to evaluate for possible ERCP.          ______________________________ Lodema Pilot, MD     BL/MEDQ  D:  05/19/2013  T:  05/20/2013  Job:  [id number]

## 2013-05-20 NOTE — Progress Notes (Signed)
Utilization review completed.  

## 2013-05-21 LAB — COMPREHENSIVE METABOLIC PANEL
ALT: 60 U/L — ABNORMAL HIGH (ref 0–35)
CO2: 23 mEq/L (ref 19–32)
Calcium: 9.2 mg/dL (ref 8.4–10.5)
Chloride: 108 mEq/L (ref 96–112)
Creatinine, Ser: 0.89 mg/dL (ref 0.50–1.10)
GFR calc Af Amer: >90 mL/min (ref >90)
Glucose, Bld: 92 mg/dL (ref 70–99)
Sodium: 136 mEq/L (ref 135–145)
Total Bilirubin: 0.3 mg/dL (ref 0.3–1.2)
Total Protein: 6.3 g/dL (ref 6.0–8.3)

## 2013-05-21 LAB — CBC WITH DIFFERENTIAL/PLATELET
Basophils Absolute: 0 10*3/uL (ref 0.0–0.1)
Eosinophils Absolute: 0.1 10*3/uL (ref 0.0–0.7)
HCT: 31.5 % — ABNORMAL LOW (ref 36.0–46.0)
Hemoglobin: 10.1 g/dL — ABNORMAL LOW (ref 12.0–15.0)
Lymphocytes Relative: 50 % — ABNORMAL HIGH (ref 12–46)
Lymphs Abs: 3.4 10*3/uL (ref 0.7–4.0)
MCH: 25.9 pg — ABNORMAL LOW (ref 26.0–34.0)
MCHC: 32.1 g/dL (ref 30.0–36.0)
Monocytes Absolute: 0.4 10*3/uL (ref 0.1–1.0)
Monocytes Relative: 6 % (ref 3–12)
Neutro Abs: 2.8 10*3/uL (ref 1.7–7.7)
RBC: 3.9 MIL/uL (ref 3.87–5.11)
WBC: 6.8 10*3/uL (ref 4.0–10.5)

## 2013-05-21 MED ORDER — HYDROCODONE-ACETAMINOPHEN 5-325 MG PO TABS: 1.0000 | ORAL_TABLET | ORAL | Status: DC | PRN

## 2013-05-21 NOTE — Progress Notes (Signed)
Pt was stable at time of discharge. Removed IV. Reviewed discharge education with patient and family. They verbalized understanding and had no further questions.

## 2013-05-21 NOTE — Progress Notes (Signed)
2 Days Post-Op  Subjective: Tolerated diet last night. Made npo at [facility] for possible ERCP today depending on MRCP. A little nasuea this am. Some soreness in abd.  Objective: Vital signs in last 24 hours: Temp:  [97.9 F (36.6 C)-98.1 F (36.7 C)] 97.9 F (36.6 C) (11/01 0514) Pulse Rate:  [61-66] 61 (11/01 0514) Resp:  [18] 18 (11/01 0514) BP: (99-111)/(63-70) 111/68 mmHg (11/01 0514) SpO2:  [96 %-98 %] 98 % (11/01 0514) Last BM Date: 05/19/13  Intake/Output from previous day: 10/31 0701 - 11/01 0700 In: 1575 [I.V.:1575] Out: 400 [Urine:400] Intake/Output this shift:    Alert, nad cta b/l Reg Obese, soft, incisions c/d/i. Expected mild TTP No edema  Lab Results:   Recent Labs  05/20/13 0425 05/21/13 0541  WBC 9.5 6.8  HGB 10.6* 10.1*  HCT 32.3* 31.5*  PLT 287 232   BMET  Recent Labs  05/20/13 0425 05/21/13 0541  NA 136 136  K 3.9 4.0  CL 109 108  CO2 20 23  GLUCOSE 123* 92  BUN 6 7  CREATININE 0.79 0.89  CALCIUM 8.8 9.2   PT/INR No results found for this basename: LABPROT, INR,  in the last 72 hours ABG No results found for this basename: PHART, PCO2, PO2, HCO3,  in the last 72 hours  Studies/Results: Dg Cholangiogram Operative  05/19/2013   CLINICAL DATA:  cholelithiasis, status post laparoscopic cholecystectomy  EXAM: INTRAOPERATIVE CHOLANGIOGRAM  TECHNIQUE: Cholangiographic images from the C-arm fluoroscopic device were submitted for interpretation post-operatively. Please see the procedural report for the amount of contrast and the fluoroscopy time utilized.  COMPARISON:  04/28/2013  FINDINGS: Intraoperative cholangiogram performed following the laparoscopic cholecystectomy. Intrahepatic biliary ducts, biliary confluence, common hepatic duct, cystic duct, a and common bile that all appear patent. Small round mobile filling defects noted initially which upon further injection advanced into a left hepatic duct, suspect injected air bubble.  IMPRESSION:  Patent biliary system, biliary tree mobile filling defects suspect air bubbles.   Electronically Signed   By: Ruel Favors M.D.   On: 05/19/2013 11:39   Mr 3d Recon At Scanner  05/20/2013   CLINICAL DATA:  Status post laparoscopic cholecystectomy yesterday. Question choledocholithiasis.  EXAM: MRI ABDOMEN WITHOUT AND WITH CONTRAST (INCLUDING MRCP)  TECHNIQUE: Multiplanar multisequence MR imaging of the abdomen was performed both before and after the administration of intravenous contrast. Heavily T2-weighted images of the biliary and pancreatic ducts were obtained, and three-dimensional MRCP images were rendered by post processing.  CONTRAST:  20mL MULTIHANCE GADOBENATE DIMEGLUMINE 529 MG/ML IV SOLN  COMPARISON:  Intraoperative cholangiogram 05/19/2013. Abdominal ultrasound 04/28/2013.  FINDINGS: The gallbladder is surgically absent. There is no fluid collection within the cholecystectomy bed. There is no intra or extrahepatic biliary dilatation. The common bile duct measures 6 mm in diameter. There is no evidence of choledocholithiasis.  There is no evidence of pancreas divisum. The pancreas appears normal without evidence of inflammation or surrounding fluid collection.  The spleen, adrenal glands and kidneys appear normal. The liver appears normal and demonstrates normal enhancement characteristics following contrast. No vascular abnormalities or enlarged lymph nodes are seen.  IMPRESSION: Negative MRCP status post cholecystectomy. No evidence of choledocholithiasis or pancreatitis.   Electronically Signed   By: Roxy Horseman M.D.   On: 05/20/2013 18:39   Mr Abd W/wo Cm/mrcp  05/20/2013   CLINICAL DATA:  Status post laparoscopic cholecystectomy yesterday. Question choledocholithiasis.  EXAM: MRI ABDOMEN WITHOUT AND WITH CONTRAST (INCLUDING MRCP)  TECHNIQUE: Multiplanar multisequence MR  imaging of the abdomen was performed both before and after the administration of intravenous contrast. Heavily  T2-weighted images of the biliary and pancreatic ducts were obtained, and three-dimensional MRCP images were rendered by post processing.  CONTRAST:  20mL MULTIHANCE GADOBENATE DIMEGLUMINE 529 MG/ML IV SOLN  COMPARISON:  Intraoperative cholangiogram 05/19/2013. Abdominal ultrasound 04/28/2013.  FINDINGS: The gallbladder is surgically absent. There is no fluid collection within the cholecystectomy bed. There is no intra or extrahepatic biliary dilatation. The common bile duct measures 6 mm in diameter. There is no evidence of choledocholithiasis.  There is no evidence of pancreas divisum. The pancreas appears normal without evidence of inflammation or surrounding fluid collection.  The spleen, adrenal glands and kidneys appear normal. The liver appears normal and demonstrates normal enhancement characteristics following contrast. No vascular abnormalities or enlarged lymph nodes are seen.  IMPRESSION: Negative MRCP status post cholecystectomy. No evidence of choledocholithiasis or pancreatitis.   Electronically Signed   By: Roxy Horseman M.D.   On: 05/20/2013 18:39    Anti-infectives: Anti-infectives   Start     Dose/Rate Route Frequency Ordered Stop   05/19/13 0734  ceFAZolin (ANCEF) IVPB 2 g/50 mL premix     2 g 100 mL/hr over 30 Minutes Intravenous On call to O.R. 05/19/13 0734 05/19/13 0915      Assessment/Plan: s/p Procedure(s): LAPAROSCOPIC CHOLECYSTECTOMY WITH INTRAOPERATIVE CHOLANGIOGRAM (N/A) Question of filling defect on IOC but MRCP negative for CBD stone. LFTs essentially normal.   Will give diet this am. If tolerates, home later today.  Discussed d/c instructions  F/u Dr Carleene Mains. Andrey Campanile, MD, FACS General, Bariatric, & Minimally Invasive Surgery [facility], [place]   LOS: 2 days    Yvonne Banks 05/21/2013

## 2013-05-26 ENCOUNTER — Other Ambulatory Visit: Payer: Self-pay

## 2013-05-26 NOTE — Discharge Summary (Signed)
Physician Discharge Summary  Patient ID: Yvonne Banks MRN: [id number] DOB/AGE: [birth year] 41 y.o.  Admit date: 05/19/2013 Discharge date: 05/21/2013  Admission Diagnoses:cholelithiasis  Discharge Diagnoses: same Active Problems:   * No active hospital problems. *   Discharged Condition: stable  Hospital Course: to OR 05/19/13 for lap chole with IOC.  She did well from this but had abnormal cholangiogram.  I consulted GI and MRCP was recommended.  She had this on POD 1 and this was negative for CBD stones.  LFT's were okay and pain continued to improve and she was stable for discharge on POD 2.  Consults: GI  Significant Diagnostic Studies: radiology: MRI: MRCP  Treatments: surgery: lap chole with IOC 05/19/13   Disposition: 01-Home or Self Care   Future Appointments Provider Department Dept Phone   06/08/2013 8:45 AM Lodema Pilot, DO [facility], [place] [phone]       Medication List    STOP taking these medications       chlorhexidine 4 % external liquid  Commonly known as:  HIBICLENS     ketorolac 10 MG tablet  Commonly known as:  TORADOL      TAKE these medications       acyclovir 400 MG tablet  Commonly known as:  ZOVIRAX  Take 400 mg by mouth 4 (four) times daily as needed. For breakouts     cetirizine 10 MG tablet  Commonly known as:  ZYRTEC  Take 10 mg by mouth daily as needed.     HYDROcodone-acetaminophen 5-325 MG per tablet  Commonly known as:  NORCO/VICODIN  Take 1-2 tablets by mouth every 4 (four) hours as needed.     hyoscyamine 0.125 MG tablet  Commonly known as:  LEVSIN, ANASPAZ  Take 0.125 mg by mouth every 6 (six) hours as needed for cramping.     ibuprofen 600 MG tablet  Commonly known as:  ADVIL,MOTRIN  Take 1 tablet (600 mg total) by mouth every 6 (six) hours as needed for pain.     INTEGRA PO  Take 1 tablet by mouth at bedtime.     mupirocin nasal ointment 2 %  Commonly known as:  BACTROBAN  Place 1  application into the nose 2 (two) times daily. Use twice a day for the first 5 days in each month for 6 months. Last month to take December     omeprazole 20 MG tablet  Commonly known as:  PRILOSEC OTC  Take 40 mg by mouth at bedtime.     sertraline 100 MG tablet  Commonly known as:  ZOLOFT  200 mg at bedtime.     SKYLA 13.5 MG Iud  Generic drug:  Levonorgestrel  by Intrauterine route continuous.     topiramate 100 MG tablet  Commonly known as:  TOPAMAX  Take 100 mg by mouth at bedtime.     Vitamin D (Ergocalciferol) 50000 UNITS Caps capsule  Commonly known as:  DRISDOL  Take 50,000 Units by mouth every 7 (seven) days.           Follow-up Information   Follow up with Lodema Pilot DAVID, DO On 06/08/2013. (8:45 AM)    Specialty:  General Surgery   Contact information:   [address] [place] [address] [phone]       Signed: Lodema Pilot DAVID 05/26/2013, 5:06 PM

## 2013-06-08 ENCOUNTER — Encounter (INDEPENDENT_AMBULATORY_CARE_PROVIDER_SITE_OTHER): Payer: Self-pay | Admitting: General Surgery

## 2013-06-08 ENCOUNTER — Ambulatory Visit (INDEPENDENT_AMBULATORY_CARE_PROVIDER_SITE_OTHER): Payer: 59 | Admitting: General Surgery

## 2013-06-08 VITALS — BP 120/80 | HR 70 | Temp 98.3°F | Resp 16 | Ht 65.0 in | Wt 227.2 lb

## 2013-06-08 DIAGNOSIS — Z5189 Encounter for other specified aftercare: Secondary | ICD-10-CM

## 2013-06-08 DIAGNOSIS — Z4889 Encounter for other specified surgical aftercare: Secondary | ICD-10-CM

## 2013-06-08 NOTE — Progress Notes (Signed)
Subjective:     Patient ID: Yvonne Banks, female   DOB: [birth year], 42 y.o.   MRN: [id number]  HPI Follow up 2.5 weeks s/p lap chole.  She had a positive cholangiogram but MRCP was negative for retained stones.  She was discharged from the hospital and has been recovering well.  She has not had any of the severe pain that she was having previously. She says that she still has some occasional bloating but ate some fast food yesterday and did not have any problems. Initially she was constipated after surgery but this has resolved. Her pathology was benign. Should not have any fevers or chills or jaundice  Review of Systems     Objective:   Physical Exam No distress and nontoxic-appearing She has no scleral icterus and no evidence of jaundice. Her abdomen is soft and nontender and her incisions are healing nicely without any sign of infection.    Assessment:     Status post laparoscopic cholecystectomy-doing well She's recovering nicely and has not as of any retained gallstones. Her postop MRCP was negative and she has been clinically doing well self hydrocele need to repeat any LFTs today. I did discuss the findings with her and recommended she called back if she has any increasing pain or fevers. Otherwise she can followup when necessary. Her pathology was benign.     Plan:     Gradually increase activity as tolerated and followup PRN

## 2013-11-18 HISTORY — PX: DILATION AND CURETTAGE OF UTERUS: SHX78

## 2014-01-02 ENCOUNTER — Other Ambulatory Visit: Payer: Self-pay | Admitting: Obstetrics and Gynecology

## 2014-01-02 DIAGNOSIS — D259 Leiomyoma of uterus, unspecified: Secondary | ICD-10-CM

## 2014-01-02 DIAGNOSIS — R52 Pain, unspecified: Secondary | ICD-10-CM

## 2014-01-08 ENCOUNTER — Ambulatory Visit
Admission: RE | Admit: 2014-01-08 | Discharge: 2014-01-08 | Disposition: A | Discharge status: Home / Self Care | Payer: 59 | Source: Ambulatory Visit | Attending: Obstetrics and Gynecology | Admitting: Obstetrics and Gynecology

## 2014-01-08 DIAGNOSIS — R52 Pain, unspecified: Secondary | ICD-10-CM

## 2014-01-08 DIAGNOSIS — D259 Leiomyoma of uterus, unspecified: Secondary | ICD-10-CM

## 2014-01-08 MED ORDER — GADOBENATE DIMEGLUMINE 529 MG/ML IV SOLN
20.0000 mL | Freq: Once | INTRAVENOUS | Status: AC | PRN
  Administered 2014-01-08: 20 mL via INTRAVENOUS

## 2014-02-06 ENCOUNTER — Encounter (HOSPITAL_COMMUNITY): Payer: Self-pay | Admitting: Pharmacy Technician

## 2014-02-10 ENCOUNTER — Other Ambulatory Visit: Payer: Self-pay | Admitting: Obstetrics and Gynecology

## 2014-02-14 NOTE — Patient Instructions (Addendum)
   Your procedure is scheduled on:  Friday, July 31  Enter through the Main Entrance of [facility] at:  11 AM Pick up the phone at the desk and dial [phone] and inform us of your arrival.  Please call this number if you have any problems the morning of surgery: [phone]  Remember: Do not eat food after midnight: Thursday Do not drink clear liquids after: 830 AM Friday, day of surgery Take these medicines the morning of surgery with a SIP OF WATER:  Acyclovir and zyrtec if needed  Do not wear jewelry, make-up, or FINGER nail polish No metal in your hair or on your body. Do not wear lotions, powders, perfumes.  You may wear deodorant.  Do not bring valuables to the hospital. Contacts, dentures or bridgework may not be worn into surgery.  Leave suitcase in the car. After Surgery it may be brought to your room. For patients being admitted to the hospital, checkout time is 11:00am the day of discharge.  Home with mother Cassandra cell [phone].

## 2014-02-16 ENCOUNTER — Encounter (HOSPITAL_COMMUNITY): Payer: Self-pay

## 2014-02-16 ENCOUNTER — Encounter (HOSPITAL_COMMUNITY)
Admission: RE | Admit: 2014-02-16 | Discharge: 2014-02-16 | Disposition: A | Discharge status: Home / Self Care | Payer: 59 | Source: Ambulatory Visit | Attending: Obstetrics and Gynecology | Admitting: Obstetrics and Gynecology

## 2014-02-16 LAB — CBC
HCT: 37.9 % (ref 36.0–46.0)
Hemoglobin: 12.5 g/dL (ref 12.0–15.0)
MCH: 28.9 pg (ref 26.0–34.0)
MCHC: 33 g/dL (ref 30.0–36.0)
MCV: 87.5 fL (ref 78.0–100.0)
PLATELETS: 298 10*3/uL (ref 150–400)
RBC: 4.33 MIL/uL (ref 3.87–5.11)
RDW: 14.5 % (ref 11.5–15.5)
WBC: 8.2 10*3/uL (ref 4.0–10.5)

## 2014-02-16 LAB — SURGICAL PCR SCREEN
MRSA, PCR: NEGATIVE
Staphylococcus aureus: NEGATIVE

## 2014-02-16 MED ORDER — DEXTROSE 5 % IV SOLN
3.0000 g | INTRAVENOUS | Status: AC
  Administered 2014-02-17: 3 g via INTRAVENOUS
  Filled 2014-02-16: qty 3000

## 2014-02-17 ENCOUNTER — Inpatient Hospital Stay (HOSPITAL_COMMUNITY)
Admission: RE | Admit: 2014-02-17 | Discharge: 2014-02-19 | DRG: 742 | Disposition: A | Discharge status: Home / Self Care | Payer: 59 | Source: Ambulatory Visit | Attending: Obstetrics and Gynecology | Admitting: Obstetrics and Gynecology

## 2014-02-17 ENCOUNTER — Encounter (HOSPITAL_COMMUNITY): Payer: 59 | Admitting: Anesthesiology

## 2014-02-17 ENCOUNTER — Encounter (HOSPITAL_COMMUNITY)
Admission: RE | Disposition: A | Discharge status: Home / Self Care | Payer: Self-pay | Source: Ambulatory Visit | Attending: Obstetrics and Gynecology

## 2014-02-17 ENCOUNTER — Inpatient Hospital Stay (HOSPITAL_COMMUNITY): Payer: 59 | Admitting: Anesthesiology

## 2014-02-17 ENCOUNTER — Encounter (HOSPITAL_COMMUNITY): Payer: Self-pay

## 2014-02-17 DIAGNOSIS — N920 Excessive and frequent menstruation with regular cycle: Secondary | ICD-10-CM | POA: Diagnosis present

## 2014-02-17 DIAGNOSIS — Z6841 Body Mass Index (BMI) 40.0 and over, adult: Secondary | ICD-10-CM

## 2014-02-17 DIAGNOSIS — D649 Anemia, unspecified: Secondary | ICD-10-CM | POA: Diagnosis present

## 2014-02-17 DIAGNOSIS — D251 Intramural leiomyoma of uterus: Secondary | ICD-10-CM | POA: Diagnosis present

## 2014-02-17 DIAGNOSIS — K219 Gastro-esophageal reflux disease without esophagitis: Secondary | ICD-10-CM | POA: Diagnosis present

## 2014-02-17 DIAGNOSIS — D252 Subserosal leiomyoma of uterus: Secondary | ICD-10-CM | POA: Diagnosis present

## 2014-02-17 DIAGNOSIS — D250 Submucous leiomyoma of uterus: Secondary | ICD-10-CM | POA: Diagnosis present

## 2014-02-17 DIAGNOSIS — Z9889 Other specified postprocedural states: Secondary | ICD-10-CM

## 2014-02-17 HISTORY — PX: MYOMECTOMY: SHX85

## 2014-02-17 LAB — TYPE AND SCREEN
ABO/RH(D): O NEG
Antibody Screen: NEGATIVE

## 2014-02-17 LAB — ABO/RH: ABO/RH(D): O NEG

## 2014-02-17 LAB — PREGNANCY, URINE: Preg Test, Ur: NEGATIVE

## 2014-02-17 SURGERY — MYOMECTOMY, ABDOMINAL APPROACH
Anesthesia: General | Site: Abdomen

## 2014-02-17 MED ORDER — GLYCOPYRROLATE 0.2 MG/ML IJ SOLN
INTRAMUSCULAR | Status: AC
  Filled 2014-02-17: qty 5

## 2014-02-17 MED ORDER — DEXAMETHASONE SODIUM PHOSPHATE 10 MG/ML IJ SOLN
INTRAMUSCULAR | Status: AC
  Filled 2014-02-17: qty 1

## 2014-02-17 MED ORDER — FENTANYL CITRATE 0.05 MG/ML IJ SOLN
INTRAMUSCULAR | Status: DC | PRN
  Administered 2014-02-17 (×5): 50 ug via INTRAVENOUS

## 2014-02-17 MED ORDER — HYDROMORPHONE HCL PF 1 MG/ML IJ SOLN
INTRAMUSCULAR | Status: DC | PRN
  Administered 2014-02-17: 1 mg via INTRAVENOUS

## 2014-02-17 MED ORDER — HYDROMORPHONE HCL PF 1 MG/ML IJ SOLN
0.2500 mg | INTRAMUSCULAR | Status: DC | PRN
  Administered 2014-02-17: 0.5 mg via INTRAVENOUS

## 2014-02-17 MED ORDER — FENTANYL CITRATE 0.05 MG/ML IJ SOLN
INTRAMUSCULAR | Status: AC
  Filled 2014-02-17: qty 5

## 2014-02-17 MED ORDER — ROCURONIUM BROMIDE 100 MG/10ML IV SOLN
INTRAVENOUS | Status: DC | PRN
  Administered 2014-02-17: 5 mg via INTRAVENOUS
  Administered 2014-02-17: 45 mg via INTRAVENOUS

## 2014-02-17 MED ORDER — HYDROMORPHONE HCL PF 1 MG/ML IJ SOLN: 0.2000 mg | INTRAMUSCULAR | Status: DC | PRN

## 2014-02-17 MED ORDER — PROMETHAZINE HCL 25 MG/ML IJ SOLN
12.5000 mg | Freq: Four times a day (QID) | INTRAMUSCULAR | Status: DC | PRN
  Administered 2014-02-17: 12.5 mg via INTRAVENOUS

## 2014-02-17 MED ORDER — MEPERIDINE HCL 25 MG/ML IJ SOLN: 6.2500 mg | INTRAMUSCULAR | Status: DC | PRN

## 2014-02-17 MED ORDER — MIDAZOLAM HCL 2 MG/2ML IJ SOLN
INTRAMUSCULAR | Status: DC | PRN
  Administered 2014-02-17: 2 mg via INTRAVENOUS

## 2014-02-17 MED ORDER — ROCURONIUM BROMIDE 100 MG/10ML IV SOLN
INTRAVENOUS | Status: AC
  Filled 2014-02-17: qty 1

## 2014-02-17 MED ORDER — SODIUM CHLORIDE 0.9 % IJ SOLN: 9.0000 mL | INTRAMUSCULAR | Status: DC | PRN

## 2014-02-17 MED ORDER — SCOPOLAMINE 1 MG/3DAYS TD PT72
MEDICATED_PATCH | TRANSDERMAL | Status: AC
  Administered 2014-02-17: 1.5 mg via TRANSDERMAL
  Filled 2014-02-17: qty 1

## 2014-02-17 MED ORDER — LACTATED RINGERS IV SOLN
INTRAVENOUS | Status: DC
  Administered 2014-02-17: 100 mL/h via INTRAVENOUS
  Administered 2014-02-17 (×2): via INTRAVENOUS

## 2014-02-17 MED ORDER — NEOSTIGMINE METHYLSULFATE 10 MG/10ML IV SOLN
INTRAVENOUS | Status: DC | PRN
  Administered 2014-02-17: 5 mg via INTRAVENOUS

## 2014-02-17 MED ORDER — BUPIVACAINE HCL (PF) 0.25 % IJ SOLN
INTRAMUSCULAR | Status: DC | PRN
  Administered 2014-02-17: 10 mL

## 2014-02-17 MED ORDER — PROMETHAZINE HCL 25 MG/ML IJ SOLN: 6.2500 mg | INTRAMUSCULAR | Status: DC | PRN

## 2014-02-17 MED ORDER — HYDROMORPHONE HCL PF 1 MG/ML IJ SOLN
0.2500 mg | INTRAMUSCULAR | Status: DC | PRN
  Administered 2014-02-17 (×4): 0.5 mg via INTRAVENOUS

## 2014-02-17 MED ORDER — KETOROLAC TROMETHAMINE 30 MG/ML IJ SOLN: 30.0000 mg | Freq: Four times a day (QID) | INTRAMUSCULAR | Status: DC

## 2014-02-17 MED ORDER — PROPOFOL 10 MG/ML IV EMUL
INTRAVENOUS | Status: AC
  Filled 2014-02-17: qty 20

## 2014-02-17 MED ORDER — FLUTICASONE PROPIONATE 50 MCG/ACT NA SUSP
2.0000 | Freq: Every day | NASAL | Status: DC
  Administered 2014-02-18 – 2014-02-19 (×2): 2 via NASAL
  Filled 2014-02-17: qty 16

## 2014-02-17 MED ORDER — ONDANSETRON HCL 4 MG/2ML IJ SOLN
INTRAMUSCULAR | Status: AC
  Filled 2014-02-17: qty 2

## 2014-02-17 MED ORDER — MIDAZOLAM HCL 2 MG/2ML IJ SOLN
INTRAMUSCULAR | Status: AC
  Filled 2014-02-17: qty 2

## 2014-02-17 MED ORDER — HYDROMORPHONE HCL PF 1 MG/ML IJ SOLN
INTRAMUSCULAR | Status: AC
  Filled 2014-02-17: qty 1

## 2014-02-17 MED ORDER — FAMOTIDINE 20 MG PO TABS
ORAL_TABLET | ORAL | Status: AC
  Administered 2014-02-17: 20 mg via ORAL
  Filled 2014-02-17: qty 1

## 2014-02-17 MED ORDER — TOPIRAMATE 100 MG PO TABS
100.0000 mg | ORAL_TABLET | Freq: Every day | ORAL | Status: DC
  Administered 2014-02-17 – 2014-02-18 (×2): 100 mg via ORAL
  Filled 2014-02-17 (×2): qty 1

## 2014-02-17 MED ORDER — SERTRALINE HCL 100 MG PO TABS
200.0000 mg | ORAL_TABLET | Freq: Every day | ORAL | Status: DC
  Administered 2014-02-17 – 2014-02-18 (×2): 200 mg via ORAL
  Filled 2014-02-17 (×2): qty 2

## 2014-02-17 MED ORDER — PROMETHAZINE HCL 25 MG/ML IJ SOLN
INTRAMUSCULAR | Status: AC
  Administered 2014-02-17: 12.5 mg via INTRAVENOUS
  Filled 2014-02-17: qty 1

## 2014-02-17 MED ORDER — PANTOPRAZOLE SODIUM 40 MG PO TBEC
40.0000 mg | DELAYED_RELEASE_TABLET | Freq: Every day | ORAL | Status: DC
  Administered 2014-02-18 – 2014-02-19 (×2): 40 mg via ORAL
  Filled 2014-02-17 (×2): qty 1

## 2014-02-17 MED ORDER — ACETAMINOPHEN 160 MG/5ML PO SOLN
975.0000 mg | Freq: Once | ORAL | Status: AC
  Administered 2014-02-17: 975 mg via ORAL

## 2014-02-17 MED ORDER — ACETAMINOPHEN 160 MG/5ML PO SOLN
ORAL | Status: AC
  Filled 2014-02-17: qty 20.3

## 2014-02-17 MED ORDER — BUPIVACAINE HCL (PF) 0.25 % IJ SOLN
INTRAMUSCULAR | Status: AC
  Filled 2014-02-17: qty 30

## 2014-02-17 MED ORDER — SIMETHICONE 80 MG PO CHEW
80.0000 mg | CHEWABLE_TABLET | Freq: Four times a day (QID) | ORAL | Status: DC | PRN
  Administered 2014-02-18: 80 mg via ORAL
  Filled 2014-02-17: qty 1

## 2014-02-17 MED ORDER — MENTHOL 3 MG MT LOZG
1.0000 | LOZENGE | OROMUCOSAL | Status: DC | PRN
  Filled 2014-02-17: qty 9

## 2014-02-17 MED ORDER — KETOROLAC TROMETHAMINE 30 MG/ML IJ SOLN
30.0000 mg | Freq: Four times a day (QID) | INTRAMUSCULAR | Status: DC
  Administered 2014-02-17: 30 mg via INTRAVENOUS
  Filled 2014-02-17: qty 1

## 2014-02-17 MED ORDER — NALOXONE HCL 0.4 MG/ML IJ SOLN: 0.4000 mg | INTRAMUSCULAR | Status: DC | PRN

## 2014-02-17 MED ORDER — LIDOCAINE HCL (CARDIAC) 20 MG/ML IV SOLN
INTRAVENOUS | Status: AC
  Filled 2014-02-17: qty 5

## 2014-02-17 MED ORDER — NEOSTIGMINE METHYLSULFATE 10 MG/10ML IV SOLN
INTRAVENOUS | Status: AC
  Filled 2014-02-17: qty 1

## 2014-02-17 MED ORDER — MIDAZOLAM HCL 2 MG/2ML IJ SOLN: 0.5000 mg | Freq: Once | INTRAMUSCULAR | Status: DC | PRN

## 2014-02-17 MED ORDER — SODIUM CHLORIDE 0.9 % IJ SOLN
INTRAMUSCULAR | Status: AC
  Filled 2014-02-17: qty 100

## 2014-02-17 MED ORDER — SUCCINYLCHOLINE CHLORIDE 20 MG/ML IJ SOLN
INTRAMUSCULAR | Status: DC | PRN
  Administered 2014-02-17: 140 mg via INTRAVENOUS

## 2014-02-17 MED ORDER — ONDANSETRON HCL 4 MG/2ML IJ SOLN: 4.0000 mg | Freq: Four times a day (QID) | INTRAMUSCULAR | Status: DC | PRN

## 2014-02-17 MED ORDER — DEXTROSE IN LACTATED RINGERS 5 % IV SOLN
INTRAVENOUS | Status: DC
  Administered 2014-02-17: 21:00:00 via INTRAVENOUS

## 2014-02-17 MED ORDER — GLYCOPYRROLATE 0.2 MG/ML IJ SOLN
INTRAMUSCULAR | Status: DC | PRN
  Administered 2014-02-17: .8 mg via INTRAVENOUS
  Administered 2014-02-17: 0.1 mg via INTRAVENOUS

## 2014-02-17 MED ORDER — ACETAMINOPHEN 160 MG/5ML PO SOLN
ORAL | Status: AC
Start: 2014-02-17 — End: 2014-02-18
  Filled 2014-02-17: qty 20.3

## 2014-02-17 MED ORDER — ONDANSETRON HCL 4 MG/2ML IJ SOLN
INTRAMUSCULAR | Status: DC | PRN
  Administered 2014-02-17: 4 mg via INTRAVENOUS

## 2014-02-17 MED ORDER — SUCCINYLCHOLINE CHLORIDE 20 MG/ML IJ SOLN
INTRAMUSCULAR | Status: AC
  Filled 2014-02-17: qty 10

## 2014-02-17 MED ORDER — FAMOTIDINE 20 MG PO TABS
20.0000 mg | ORAL_TABLET | Freq: Every day | ORAL | Status: DC
  Administered 2014-02-17: 20 mg via ORAL

## 2014-02-17 MED ORDER — KETOROLAC TROMETHAMINE 30 MG/ML IJ SOLN
15.0000 mg | Freq: Once | INTRAMUSCULAR | Status: AC | PRN
  Administered 2014-02-17: 30 mg via INTRAVENOUS

## 2014-02-17 MED ORDER — DEXAMETHASONE SODIUM PHOSPHATE 10 MG/ML IJ SOLN
INTRAMUSCULAR | Status: DC | PRN
  Administered 2014-02-17: 4 mg via INTRAVENOUS

## 2014-02-17 MED ORDER — 0.9 % SODIUM CHLORIDE (POUR BTL) OPTIME
TOPICAL | Status: DC | PRN
  Administered 2014-02-17: 1000 mL

## 2014-02-17 MED ORDER — PROPOFOL 10 MG/ML IV BOLUS
INTRAVENOUS | Status: DC | PRN
  Administered 2014-02-17: 200 mg via INTRAVENOUS

## 2014-02-17 MED ORDER — METHYLENE BLUE 1 % INJ SOLN
INTRAMUSCULAR | Status: AC
  Filled 2014-02-17: qty 1

## 2014-02-17 MED ORDER — SCOPOLAMINE 1 MG/3DAYS TD PT72
1.0000 | MEDICATED_PATCH | Freq: Once | TRANSDERMAL | Status: DC
  Administered 2014-02-17: 1.5 mg via TRANSDERMAL

## 2014-02-17 MED ORDER — ONDANSETRON HCL 4 MG PO TABS: 4.0000 mg | ORAL_TABLET | Freq: Four times a day (QID) | ORAL | Status: DC | PRN

## 2014-02-17 MED ORDER — IBUPROFEN 800 MG PO TABS
800.0000 mg | ORAL_TABLET | Freq: Three times a day (TID) | ORAL | Status: DC | PRN
  Administered 2014-02-18: 800 mg via ORAL
  Filled 2014-02-17: qty 1

## 2014-02-17 MED ORDER — VASOPRESSIN 20 UNIT/ML IJ SOLN
INTRAVENOUS | Status: DC | PRN
  Administered 2014-02-17: 15:00:00 via INTRAMUSCULAR

## 2014-02-17 MED ORDER — KETOROLAC TROMETHAMINE 30 MG/ML IJ SOLN
INTRAMUSCULAR | Status: AC
Start: 2014-02-17 — End: 2014-02-18
  Filled 2014-02-17: qty 1

## 2014-02-17 MED ORDER — HYDROMORPHONE 0.3 MG/ML IV SOLN
INTRAVENOUS | Status: DC
  Administered 2014-02-17: 0.4 mg via INTRAVENOUS
  Administered 2014-02-17: 17:00:00 via INTRAVENOUS
  Administered 2014-02-18: 0.4 mg via INTRAVENOUS
  Filled 2014-02-17: qty 25

## 2014-02-17 MED ORDER — LIDOCAINE HCL (CARDIAC) 20 MG/ML IV SOLN
INTRAVENOUS | Status: DC | PRN
  Administered 2014-02-17: 80 mg via INTRAVENOUS

## 2014-02-17 MED ORDER — DOCUSATE SODIUM 100 MG PO CAPS
100.0000 mg | ORAL_CAPSULE | Freq: Two times a day (BID) | ORAL | Status: DC
  Administered 2014-02-17 – 2014-02-19 (×4): 100 mg via ORAL
  Filled 2014-02-17 (×4): qty 1

## 2014-02-17 MED ORDER — VASOPRESSIN 20 UNIT/ML IJ SOLN
INTRAMUSCULAR | Status: AC
  Filled 2014-02-17: qty 3

## 2014-02-17 MED ORDER — OXYCODONE-ACETAMINOPHEN 5-325 MG PO TABS
1.0000 | ORAL_TABLET | ORAL | Status: DC | PRN
  Administered 2014-02-18 (×4): 2 via ORAL
  Administered 2014-02-18: 1 via ORAL
  Administered 2014-02-19: 2 via ORAL
  Filled 2014-02-17 (×5): qty 2
  Filled 2014-02-17: qty 1

## 2014-02-17 SURGICAL SUPPLY — 54 items
BARRIER ADHS 3X4 INTERCEED (GAUZE/BANDAGES/DRESSINGS) ×2 IMPLANT
BRR ADH 4X3 ABS CNTRL BYND (GAUZE/BANDAGES/DRESSINGS) ×1
CANISTER SUCT 3000ML (MISCELLANEOUS) ×3 IMPLANT
CATH FOLEY 2WAY  3CC  8FR (CATHETERS)
CATH FOLEY 2WAY 3CC 8FR (CATHETERS) IMPLANT
CLOTH BEACON ORANGE TIMEOUT ST (SAFETY) ×3 IMPLANT
CONT PATH 16OZ SNAP LID 3702 (MISCELLANEOUS) ×1 IMPLANT
CONT SPEC PATH 64OZ SNAP LID (MISCELLANEOUS) ×3 IMPLANT
DECANTER SPIKE VIAL GLASS SM (MISCELLANEOUS) ×3 IMPLANT
DRAPE CESAREAN BIRTH W POUCH (DRAPES) ×3 IMPLANT
DRSG OPSITE POSTOP 4X10 (GAUZE/BANDAGES/DRESSINGS) ×2 IMPLANT
ELECT NDL TIP 2.8 STRL (NEEDLE) ×1 IMPLANT
ELECT NEEDLE TIP 2.8 STRL (NEEDLE) ×6 IMPLANT
FILTER STRAW FLUID ASPIR (MISCELLANEOUS) IMPLANT
GAUZE SPONGE 4X4 16PLY XRAY LF (GAUZE/BANDAGES/DRESSINGS) ×3 IMPLANT
GLOVE BIOGEL PI IND STRL 6.5 (GLOVE) IMPLANT
GLOVE BIOGEL PI IND STRL 7.0 (GLOVE) ×1 IMPLANT
GLOVE BIOGEL PI IND STRL 7.5 (GLOVE) IMPLANT
GLOVE BIOGEL PI INDICATOR 6.5 (GLOVE) ×4
GLOVE BIOGEL PI INDICATOR 7.0 (GLOVE) ×8
GLOVE BIOGEL PI INDICATOR 7.5 (GLOVE) ×4
GLOVE ECLIPSE 6.5 STRL STRAW (GLOVE) ×1 IMPLANT
GOWN STRL REUS W/TWL LRG LVL3 (GOWN DISPOSABLE) ×9 IMPLANT
IV STOPCOCK 4 WAY 40  W/Y SET (IV SOLUTION)
IV STOPCOCK 4 WAY 40 W/Y SET (IV SOLUTION) IMPLANT
NDL HYPO 25X1 1.5 SAFETY (NEEDLE) ×1 IMPLANT
NDL SPNL 22GX3.5 QUINCKE BK (NEEDLE) IMPLANT
NEEDLE HYPO 25X1 1.5 SAFETY (NEEDLE) ×3 IMPLANT
NEEDLE SPNL 22GX3.5 QUINCKE BK (NEEDLE) ×3 IMPLANT
NEEDLE SPNL 22GX9.0 ACCUTG (NEEDLE) IMPLANT
NS IRRIG 1000ML POUR BTL (IV SOLUTION) ×3 IMPLANT
PACK ABDOMINAL GYN (CUSTOM PROCEDURE TRAY) ×3 IMPLANT
PAD OB MATERNITY 4.3X12.25 (PERSONAL CARE ITEMS) ×3 IMPLANT
RTRCTR C-SECT PINK 25CM LRG (MISCELLANEOUS) ×2 IMPLANT
SPONGE GAUZE 4X4 12PLY STER LF (GAUZE/BANDAGES/DRESSINGS) ×3 IMPLANT
STAPLER VISISTAT 35W (STAPLE) ×2 IMPLANT
SUT MNCRL AB 4-0 PS2 18 (SUTURE) ×4 IMPLANT
SUT MON AB 3-0 SH 27 (SUTURE) ×9
SUT MON AB 3-0 SH27 (SUTURE) IMPLANT
SUT PLAIN 2 0 XLH (SUTURE) ×2 IMPLANT
SUT VIC AB 0 CT1 18XCR BRD8 (SUTURE) ×3 IMPLANT
SUT VIC AB 0 CT1 36 (SUTURE) ×6 IMPLANT
SUT VIC AB 0 CT1 8-18 (SUTURE) ×3
SUT VIC AB 2-0 CT1 27 (SUTURE) ×3
SUT VIC AB 2-0 CT1 TAPERPNT 27 (SUTURE) ×1 IMPLANT
SUT VIC AB 2-0 SH 27 (SUTURE) ×6
SUT VIC AB 2-0 SH 27XBRD (SUTURE) ×4 IMPLANT
SUT VIC AB 4-0 PS2 27 (SUTURE) IMPLANT
SYRINGE 60CC LL (MISCELLANEOUS) IMPLANT
SYRINGE CONTROL L 12CC (SYRINGE) ×3 IMPLANT
SYRINGE CONTROL LL 12CC (SYRINGE) ×1 IMPLANT
TOWEL OR 17X24 6PK STRL BLUE (TOWEL DISPOSABLE) ×6 IMPLANT
TRAY FOLEY CATH 14FR (SET/KITS/TRAYS/PACK) ×3 IMPLANT
WATER STERILE IRR 1000ML POUR (IV SOLUTION) ×1 IMPLANT

## 2014-02-17 NOTE — Anesthesia Preprocedure Evaluation (Signed)
Anesthesia Evaluation  Patient identified by MRN, date of birth, ID band Patient awake    Reviewed: Allergy & Precautions, H&P , Patient's Chart, lab work & pertinent test results, reviewed documented beta blocker date and time   History of Anesthesia Complications Negative for: history of anesthetic complications  Airway Mallampati: III TM Distance: >3 FB Neck ROM: full    Dental   Pulmonary shortness of breath and with exertion,  breath sounds clear to auscultation        Cardiovascular Exercise Tolerance: Good Rhythm:regular Rate:Normal     Neuro/Psych  Headaches,    GI/Hepatic GERD-  ,  Endo/Other  Morbid obesity  Renal/GU      Musculoskeletal   Abdominal   Peds  Hematology  (+) anemia ,   Anesthesia Other Findings   Reproductive/Obstetrics                           Anesthesia Physical Anesthesia Plan  ASA: III  Anesthesia Plan: General ETT   Post-op Pain Management:    Induction: Rapid sequence, Cricoid pressure planned and Intravenous  Airway Management Planned: Video Laryngoscope Planned  Additional Equipment:   Intra-op Plan:   Post-operative Plan:   Informed Consent: I have reviewed the patients History and Physical, chart, labs and discussed the procedure including the risks, benefits and alternatives for the proposed anesthesia with the patient or authorized representative who has indicated his/her understanding and acceptance.   Dental Advisory Given  Plan Discussed with: CRNA and Surgeon  Anesthesia Plan Comments:        currently nauseous with migraine... Will proceed with antinausea meds and RSI with glidescope Anesthesia Quick Evaluation

## 2014-02-17 NOTE — Anesthesia Procedure Notes (Addendum)
Procedure Name: Intubation Date/Time: 02/17/2014 1:13 PM Performed by: Flossie Dibble Pre-anesthesia Checklist: Suction available, Emergency Drugs available, Timeout performed, Patient identified and Patient being monitored Patient Re-evaluated:Patient Re-evaluated prior to inductionOxygen Delivery Method: Circle system utilized Preoxygenation: Pre-oxygenation with 100% oxygen Intubation Type: Rapid sequence and IV induction Ventilation: Mask ventilation without difficulty Laryngoscope Size: 3 (GLide Scope Blade.) Grade View: Grade I Tube type: Oral Tube size: 7.0 mm Number of attempts: 1 Airway Equipment and Method: Video-laryngoscopy and Patient positioned with wedge pillow (Patient positioned with 6 folded bath blankets, 3  each side of  her back + 3 under her upper shoulder/neck/head along with head cradle for snifing position. ) Placement Confirmation: ETT inserted through vocal cords under direct vision,  positive ETCO2 and breath sounds checked- equal and bilateral Secured at: 21 cm Dental Injury: Teeth and Oropharynx as per pre-operative assessment  Difficulty Due To: Difficulty was anticipated   Performed by: Flossie Dibble

## 2014-02-17 NOTE — Anesthesia Postprocedure Evaluation (Signed)
  Anesthesia Post-op Note  Anesthesia Post Note  Patient: Yvonne Banks  Procedure(s) Performed: Procedure(s) (LRB): Exploratory Laparotomy MYOMECTOMY (N/A)  Anesthesia type: General  Patient location: PACU  Post pain: Pain level controlled  Post assessment: Post-op Vital signs reviewed  Last Vitals:  Filed Vitals:   02/17/14 1511  BP:   Pulse:   Temp: 36.7 C  Resp:     Post vital signs: Reviewed  Level of consciousness: sedated  Complications: No apparent anesthesia complications

## 2014-02-17 NOTE — Transfer of Care (Signed)
Immediate Anesthesia Transfer of Care Note  Patient: Yvonne Banks  Procedure(s) Performed: Procedure(s): Exploratory Laparotomy MYOMECTOMY (N/A)  Patient Location: PACU  Anesthesia Type:General  Level of Consciousness: awake, alert  and oriented  Airway & Oxygen Therapy: Patient Spontanous Breathing and Patient connected to nasal cannula oxygen  Post-op Assessment: Report given to PACU RN and Post -op Vital signs reviewed and stable  Post vital signs: Reviewed and stable  Complications: No apparent anesthesia complications

## 2014-02-17 NOTE — Brief Op Note (Signed)
02/17/2014  3:11 PM  PATIENT:  Yvonne Banks  43 y.o. female  PRE-OPERATIVE DIAGNOSIS:  Submucosal Fibroid, Menometrorrhagia, morbid obesity  POST-OPERATIVE DIAGNOSIS:  Submucosal?intramural/subserosal  Fibroid, Menometrorrhagia, morbid obesity  PROCEDURE:  Exploratory laparotomy, multiple myomectomy  SURGEON:  Surgeon(s) and Role:    * Breana Litts Clint Bolder, MD - Primary  PHYSICIAN ASSISTANT:   ASSISTANTS: Julianne Handler, CNM   ANESTHESIA:   general Findings: large submucosal fibroid, multiple uterine fibroid, nl ovaries, nl tubes, nl upper abdomen. Endometrial cavity entered  EBL:  Total I/O In: 2600 [I.V.:2600] Out: 250 [Urine:200; Blood:50]  BLOOD ADMINISTERED:none  DRAINS: none   LOCAL MEDICATIONS USED:  MARCAINE     SPECIMEN:  Source of Specimen:  myoma x 5  DISPOSITION OF SPECIMEN:  PATHOLOGY  COUNTS:  YES  TOURNIQUET:  * No tourniquets in log *  DICTATION: .Other Dictation: Dictation Number [id number]  PLAN OF CARE: Admit to inpatient   PATIENT DISPOSITION:  PACU - hemodynamically stable.   Delay start of Pharmacological VTE agent (>24hrs) due to surgical blood loss or risk of bleeding: no

## 2014-02-18 LAB — CBC
HEMATOCRIT: 34.7 % — AB (ref 36.0–46.0)
Hemoglobin: 11.2 g/dL — ABNORMAL LOW (ref 12.0–15.0)
MCH: 28.2 pg (ref 26.0–34.0)
MCHC: 32.3 g/dL (ref 30.0–36.0)
MCV: 87.4 fL (ref 78.0–100.0)
Platelets: 268 10*3/uL (ref 150–400)
RBC: 3.97 MIL/uL (ref 3.87–5.11)
RDW: 14.5 % (ref 11.5–15.5)
WBC: 13.8 10*3/uL — ABNORMAL HIGH (ref 4.0–10.5)

## 2014-02-18 NOTE — Op Note (Signed)
Yvonne Banks, Yvonne Banks             ACCOUNT NO.:  [id number]  MEDICAL RECORD NO.:  [id number]  LOCATION:  8127                          FACILITY:  [facility]  PHYSICIAN:  Servando Salina, M.D.DATE OF BIRTH:  January 17, 1971  DATE OF PROCEDURE:  02/17/2014 DATE OF DISCHARGE:                              OPERATIVE REPORT   PREOPERATIVE DIAGNOSES:  Dysfunctional uterine bleeding/menometrorrhagia, submucosal fibroid.  PROCEDURE:  Exploratory laparotomy, multiple myomectomy.  POSTOPERATIVE DIAGNOSES:  Submucosal intramural subserosal fibroids, menometrorrhagia, morbid obesity.  ANESTHESIA:  General.  SURGEON:  Servando Salina, MD  ASSISTANT:  Julianne Handler, CNM  DESCRIPTION OF PROCEDURE:  Under adequate general anesthesia, the patient was placed in the supine position.  She was sterilely prepped and draped in usual fashion.  Indwelling Foley catheter was sterilely placed.  A 0.25% Marcaine was injected along the planned Pfannenstiel skin incision site.  Pfannenstiel skin incision was then made, carried down to the rectus fascia.  The rectus fascia was then bluntly and sharply dissected off the rectus muscle in superior and inferior fashion.  The rectus muscle was split in midline.  Large amount of preperitoneal fat was noted.  Continued dissection resulted in finding of the peritoneum finally at which time it  was bluntly opened and extended carefully.  Upper abdomen exploration revealed a normal liver edge, normal palpable kidney.  Attention was then turned to the pelvis where there was evidence of a fundal 2.5-cm fibroid, normal fallopian tubes, normal ovaries, and a slightly enlarged uterus with a small anterior subserosal fibroid noted as well.  An Alexis  retractor was placed and the bowels were then packed upwardly.  A dilute solution of Pitressin was injected on the fundal fibroid and it was then used as an anchor with a single-tooth tenaculum used to grasp that fibroid  and pulling the uterus up into the field.  Anteriorly, the fibroid that was noted was injected with dilute solution of Pitressin.  A vertical incision was made over that fibroid.  It was enucleated from its base, an intramural fibroid was noted.  The incision was continued as the bilateral palpation of the uterus revealed that there was a central fibroid.  The endometrial cavity was subsequently entered.  It was then noted that there was indeed a right fundal intramural fibroid which extended into the entire uterine cavity.  Once this was exposed and then enucleated from its base, it allowed for palpation of the uterine cavity. Another smaller submucosal  fibroid was found and an incision was made within the cavity and the fibroid removed.  Once the fibroids were removed anteriorly, the defect was closed with interrupted figure-of-eight 0 Vicryl sutures until the serosal surface was reached.  At this time, 2-0 Monocryl suture was used to close the serosa in a baseball stitch fashion.  Two additional fibroids including the one that being used for anchor was injected with dilute pitressin and removed.  The fibroid bases were closed with 2-0 Monocryl sutures.  No other fibroids was palpated at which time, the procedure was felt to be complete.  The abdomen was copiously irrigated and suctioned.  The left ovary which initially was little bit adherent was freed from the left side ovarian fossa.  With good hemostasis achieved, Interceed was placed overlying the fibroid incision sites as was the anterior incision and packings were then removed.  The parietal peritoneum was then closed with 2-0 Vicryl.  The rectus fascia was closed with 0 Vicryl x2.  The subcutaneous area was irrigated.  Small bleeders cauterized. Interrupted 2-0 plain sutures was placed, and the skin approximated with Ethicon staples.  SPECIMEN:  Myoma x5 sent to Pathology.  ESTIMATED BLOOD LOSS:  50 mL.  FLUIDS:   2600.  URINE OUTPUT:  200 clear yellow urine.  COUNTS:  Sponge and instrument counts x2 was correct.  COMPLICATION:  None.  The patient tolerated the procedure well, was transferred to recovery in stable condition.     Servando Salina, M.D.     [place]/MEDQ  D:  02/17/2014  T:  02/18/2014  Job:  [id number]

## 2014-02-18 NOTE — Anesthesia Postprocedure Evaluation (Signed)
Anesthesia Post Note  Patient: Yvonne Banks  Procedure(s) Performed: Procedure(s): Exploratory Laparotomy MYOMECTOMY (N/A)  Anesthesia type: General  Patient location: [facility]  Post pain: Pain level controlled  Post assessment: Post-op Vital signs reviewed  Last Vitals: BP 115/54  Pulse 93  Temp(Src) 36.6 C (Oral)  Resp 18  Ht 5\' 5"  (1.651 m)  Wt 249 lb (112.946 kg)  BMI 41.44 kg/m2  SpO2 97%  Post vital signs: Reviewed  Level of consciousness: awake  Complications: No apparent anesthesia complications

## 2014-02-18 NOTE — Progress Notes (Signed)
Subjective: Patient reports tolerating PO and no problems voiding.   No flatus as yet. Objective: I have reviewed patient's vital signs.  vital signs, intake and output, medications and labs. Reviewed intraop findings with pt  Filed Vitals:   02/18/14 1000  BP: 105/51  Pulse: 88  Temp: 99 F (37.2 C)  Resp: 18   I/O last 3 completed shifts: In: 5931.6 [P.O.:980; I.V.:4951.6] Out: 2150 [Urine:2100; Blood:50]    Lab Results  Component Value Date   WBC 13.8* 02/18/2014   HGB 11.2* 02/18/2014   HCT 34.7* 02/18/2014   MCV 87.4 02/18/2014   PLT 268 02/18/2014   Lab Results  Component Value Date   CREATININE 0.89 05/21/2013    EXAM General: alert, cooperative and no distress Resp: clear to auscultation bilaterally Cardio: regular rate and rhythm, S1, S2 normal, no murmur, click, rub or gallop GI: soft, non-tender; bowel sounds normal; no masses,  no organomegaly and incision: clean, dry and intact Extremities: edema trace. no calf tenderness Vaginal Bleeding: none  Assessment: s/p Procedure(s): Exploratory Laparotomy MYOMECTOMY: stable, progressing well and tolerating diet  Plan: Encourage ambulation Advance to PO medication Discontinue IV fluids Await flatus and/or BM  LOS: 1 day    Zeva Leber A, MD 02/18/2014 10:57 AM    02/18/2014, 10:57 AM

## 2014-02-18 NOTE — Addendum Note (Signed)
Addendum created 02/18/14 1003 by Flossie Dibble, CRNA   Modules edited: Notes Section   Notes Section:  File: [id number]

## 2014-02-19 MED ORDER — IBUPROFEN 800 MG PO TABS: 800.0000 mg | ORAL_TABLET | Freq: Three times a day (TID) | ORAL | Status: DC | PRN

## 2014-02-19 MED ORDER — FLUCONAZOLE 150 MG PO TABS: 150.0000 mg | ORAL_TABLET | Freq: Every day | ORAL | Status: DC

## 2014-02-19 MED ORDER — FLUCONAZOLE 150 MG PO TABS
150.0000 mg | ORAL_TABLET | Freq: Every day | ORAL | Status: DC
  Administered 2014-02-19: 150 mg via ORAL
  Filled 2014-02-19: qty 1

## 2014-02-19 MED ORDER — OXYCODONE-ACETAMINOPHEN 5-325 MG PO TABS: 1.0000 | ORAL_TABLET | ORAL | Status: DC | PRN

## 2014-02-19 NOTE — Progress Notes (Signed)
Discharge instructions reviewed with patient and family.  Patient states understanding of home care, activity, medications, signs/symptoms to report to MD and return MD office visit.  Patients' sister will assist with her care @ home and no home equipment needed.  Patient ambulated for discharge in stable condition with staff without incident

## 2014-02-19 NOTE — Discharge Instructions (Signed)
Call if temperature greater than equal to 100.4, nothing per vagina for 4-6 weeks or severe nausea vomiting, increased incisional pain , drainage or redness in the incision site, no straining with bowel movements, showers no bath °

## 2014-02-19 NOTE — Discharge Summary (Signed)
Physician Discharge Summary  Patient ID: Yvonne Banks MRN: [id number] DOB/AGE: [birth year] 43 y.o.  Admit date: 02/17/2014 Discharge date: 02/19/2014  Admission Diagnoses: menometrorrhagia, uterine fibroid, morbid obesity  Discharge Diagnoses: submucosal/intramural/subserosal fibroid, menometrorrhagia, morbid obesity Active Problems:   S/P myomectomy   Discharged Condition: stable  Hospital Course: Pt was admitted to [facility] . She underwent multiple myomectomy. Pt had uncomplicated postoperative course  Consults: None  Significant Diagnostic Studies: labs:  CBC    Component Value Date/Time   WBC 13.8* 02/18/2014 0525   RBC 3.97 02/18/2014 0525   HGB 11.2* 02/18/2014 0525   HCT 34.7* 02/18/2014 0525   PLT 268 02/18/2014 0525   MCV 87.4 02/18/2014 0525   MCH 28.2 02/18/2014 0525   MCHC 32.3 02/18/2014 0525   RDW 14.5 02/18/2014 0525   LYMPHSABS 3.4 05/21/2013 0541   MONOABS 0.4 05/21/2013 0541   EOSABS 0.1 05/21/2013 0541   BASOSABS 0.0 05/21/2013 0541      Treatments: surgery: exploratory laparotomy, myomectomy  Discharge Exam: Blood pressure 118/65, pulse 66, temperature 98.6 F (37 C), temperature source Oral, resp. rate 16, height 5\' 5"  (1.651 m), weight 112.946 kg (249 lb), SpO2 97.00%. General appearance: alert, cooperative and no distress Resp: clear to auscultation bilaterally Cardio: regular rate and rhythm, S1, S2 normal, no murmur, click, rub or gallop GI: soft, non-tender; bowel sounds normal; no masses,  no organomegaly and obese Pelvic: deferred Extremities: edema trace, no calf tenderness Skin: Skin color, texture, turgor normal. No rashes or lesions Incision/Wound:  Disposition: 01-Home or Self Care  Discharge Instructions   Diet general    Complete by:  As directed      Discharge instructions    Complete by:  As directed   Call if temperature greater than equal to 100.4, nothing per vagina for 4-6 weeks or severe nausea vomiting, increased incisional pain ,  drainage or redness in the incision site, no straining with bowel movements, showers no bath     Discharge wound care:    Complete by:  As directed   Staple removal in office  tuesday     May walk up steps    Complete by:  As directed             Medication List    STOP taking these medications       HYDROcodone-acetaminophen 5-325 MG per tablet  Commonly known as:  NORCO/VICODIN     SKYLA 13.5 MG Iud  Generic drug:  Levonorgestrel     traMADol 50 MG tablet  Commonly known as:  ULTRAM     UNABLE TO FIND      TAKE these medications       acyclovir 400 MG tablet  Commonly known as:  ZOVIRAX  Take 400 mg by mouth 4 (four) times daily as needed. For breakouts     cetirizine 10 MG tablet  Commonly known as:  ZYRTEC  Take 10 mg by mouth daily as needed for allergies.     fluticasone 50 MCG/ACT nasal spray  Commonly known as:  FLONASE  Place 2 sprays into both nostrils daily.     hydrocortisone cream 1 %  Apply 1 application topically 2 (two) times daily as needed for itching (For skin irritation.).     ibuprofen 800 MG tablet  Commonly known as:  ADVIL,MOTRIN  Take 1 tablet (800 mg total) by mouth every 8 (eight) hours as needed (mild pain).     omeprazole 20 MG tablet  Commonly known as:  PRILOSEC OTC  Take 40 mg by mouth at bedtime as needed (For heartburn.).     oxyCODONE-acetaminophen 5-325 MG per tablet  Commonly known as:  PERCOCET/ROXICET  Take 1-2 tablets by mouth every 4 (four) hours as needed for severe pain (moderate to severe pain (when tolerating fluids)).     sertraline 100 MG tablet  Commonly known as:  ZOLOFT  200 mg at bedtime.     topiramate 100 MG tablet  Commonly known as:  TOPAMAX  Take 100 mg by mouth at bedtime.     Vitamin D (Ergocalciferol) 50000 UNITS Caps capsule  Commonly known as:  DRISDOL  Take 50,000 Units by mouth every 7 (seven) days. Patient takes on Saturday.           Follow-up Information   Follow up with  Marvene Staff, MD.   Specialty:  Obstetrics and Gynecology   Contact information:   [place] [id number] [phone]       Follow up with [facility] On 02/21/2014. (staple removal)    Contact information:   [place] [phone]      Signed: Emberley Kral A 02/19/2014, 9:10 AM

## 2014-02-19 NOTE — Progress Notes (Signed)
Subjective: Patient reports tolerating PO, + flatus and no problems voiding.    Objective: I have reviewed patient's vital signs.  vital signs, intake and output and labs. Filed Vitals:   02/19/14 0557  BP: 118/65  Pulse: 66  Temp: 98.6 F (37 C)  Resp: 16   I/O last 3 completed shifts: In: 2431.6 [P.O.:980; I.V.:1451.6] Out: 1550 [Urine:1550]    Lab Results  Component Value Date   WBC 13.8* 02/18/2014   HGB 11.2* 02/18/2014   HCT 34.7* 02/18/2014   MCV 87.4 02/18/2014   PLT 268 02/18/2014   Lab Results  Component Value Date   CREATININE 0.89 05/21/2013    EXAM General: alert, cooperative and no distress Resp: clear to auscultation bilaterally Cardio: regular rate and rhythm, S1, S2 normal, no murmur, click, rub or gallop GI: soft, non-tender; bowel sounds normal; no masses,  no organomegaly and incision: clean, dry and intact honeycomb dressing in place Extremities: edema trace no calf tenderness Vaginal Bleeding: none  Assessment: s/p Procedure(s): Exploratory Laparotomy MYOMECTOMY: stable, progressing well, tolerating diet and anemia  Plan: Encourage ambulation Discharge home D/c instructions reviewed. Staples out in  2 days  LOS: 2 days    Daun Rens A, MD 02/19/2014 8:59 AM    02/19/2014, 8:59 AM

## 2014-02-20 ENCOUNTER — Encounter (HOSPITAL_COMMUNITY): Payer: Self-pay | Admitting: Obstetrics and Gynecology

## 2015-12-11 ENCOUNTER — Ambulatory Visit (INDEPENDENT_AMBULATORY_CARE_PROVIDER_SITE_OTHER): Payer: Managed Care, Other (non HMO) | Admitting: Internal Medicine

## 2015-12-11 ENCOUNTER — Ambulatory Visit (INDEPENDENT_AMBULATORY_CARE_PROVIDER_SITE_OTHER)
Admission: RE | Admit: 2015-12-11 | Discharge: 2015-12-11 | Disposition: A | Discharge status: Home / Self Care | Payer: Managed Care, Other (non HMO) | Source: Ambulatory Visit | Attending: Internal Medicine | Admitting: Internal Medicine

## 2015-12-11 ENCOUNTER — Encounter: Payer: Self-pay | Admitting: Internal Medicine

## 2015-12-11 VITALS — BP 126/84 | HR 107 | Ht 65.0 in | Wt 261.0 lb

## 2015-12-11 DIAGNOSIS — R05 Cough: Secondary | ICD-10-CM | POA: Diagnosis not present

## 2015-12-11 DIAGNOSIS — R0789 Other chest pain: Secondary | ICD-10-CM

## 2015-12-11 DIAGNOSIS — R058 Other specified cough: Secondary | ICD-10-CM

## 2015-12-11 MED ORDER — ACETAMINOPHEN-CODEINE #3 300-30 MG PO TABS: ORAL_TABLET | ORAL | Status: DC

## 2015-12-11 MED ORDER — PANTOPRAZOLE SODIUM 40 MG PO TBEC: 40.0000 mg | DELAYED_RELEASE_TABLET | Freq: Every day | ORAL | Status: DC

## 2015-12-11 MED ORDER — PREDNISONE 10 MG PO TABS: ORAL_TABLET | ORAL | Status: DC

## 2015-12-11 MED ORDER — TRAMADOL HCL 50 MG PO TABS: ORAL_TABLET | ORAL | Status: DC

## 2015-12-11 MED ORDER — FAMOTIDINE 20 MG PO TABS: ORAL_TABLET | ORAL | Status: DC

## 2015-12-11 NOTE — Patient Instructions (Signed)
The key to effective treatment for your cough is eliminating the non-stop cycle of cough you're stuck in long enough to let your airway heal completely and then see if there is anything still making you cough once you stop the cough suppression, but this should take no more than 5 days to figure out  First take delsym two tsp every 12 hours and supplement if needed with  Tylenol #3 mg up to 1-2 every 4 hours to suppress the urge to cough at all or even clear your throat. Swallowing water or using ice chips/non mint and menthol containing candies (such as lifesavers or sugarless jolly ranchers) are also effective.  You should rest your voice and avoid activities that you know make you cough.  Once you have eliminated the cough for 3 straight days try reducing the tylenol #3  first,  then the delsym as tolerated.    Prednisone 10 mg take  4 each am x 2 days,   2 each am x 2 days,  1 each am x 2 days and stop (this is to eliminate allergies and inflammation from coughing)  Protonix (pantoprazole) Take 30-60 min before first meal of the day and Pepcid 20 mg one bedtime  until cough is completely gone for at least a week without the need for cough suppression  GERD (REFLUX)  is an extremely common cause of respiratory symptoms, many times with no significant heartburn at all.    It can be treated with medication, but also with lifestyle changes including avoidance of late meals, excessive alcohol, smoking cessation, and avoid fatty foods, chocolate, peppermint, colas, red wine, and acidic juices such as orange juice.  NO MINT OR MENTHOL PRODUCTS SO NO COUGH DROPS  USE HARD CANDY INSTEAD (jolley ranchers or Stover's or Lifesavers (all available in sugarless versions) NO OIL BASED VITAMINS - use powdered substitutes.  Please remember to go to the x-ray department downstairs for your tests - we will call you with the results when they are available.     Return in 2 weeks if not all better

## 2015-12-11 NOTE — Progress Notes (Signed)
Subjective:    Patient ID: Yvonne Banks, female    DOB: August 02, 1970,    MRN: [id number]  HPI  34 yobf quit smoking in 2003 with tendency to recurrent  sinus problems somewhat less severe but typcially in fall every year and cough eval by Tommie Ard around 2014 dx as gerd rx otc resolved over a few weeks and did not recur then recurrent cough April 2017 first treated as bronchitis with abx / prednisone no better so started back on gerd rx mid may 2017 and referred to pulmonary clinic 12/11/2015 by Dr Stephanie Acre   12/11/2015 1st [facility] office visit/ [name]   Chief Complaint  Patient presents with  . Pulmonary Consult    Referred by Dr. Jonathon Jordan. Pt c/o cough since April 2017- prod with clear sputum. She c/o pain under right breast for the past 2 wks. Cough is worse with laughing and when she exerts herself.   acute onset with chills/sorethroat/ runny nose/ sneezing prod thick white never purulent  24/7 initially but now more day with talking and now on singulair and lastly gerd and some better by ov but now R CP with coughing and worse lying down.  No better with ,multiple abx/ pred  No obvious other patterns in day to day or daytime variabilty or assoc excess/ purulent sputum or mucus plugs   or chest tightness, subjective wheeze overt sinus or hb symptoms. No unusual exp hx or h/o childhood pna/ asthma or knowledge of premature birth.  Sleeping ok without nocturnal  or early am exacerbation  of respiratory  c/o's or need for noct saba. Also denies any obvious fluctuation of symptoms with weather or environmental changes or other aggravating or alleviating factors except as outlined above   Current Medications, Allergies, Complete Past Medical History, Past Surgical History, Family History, and Social History were reviewed in [employer] record.               Review of Systems  Constitutional: Negative for fever, chills and unexpected weight  change.  HENT: Negative for congestion, dental problem, ear pain, nosebleeds, postnasal drip, rhinorrhea, sinus pressure, sneezing, sore throat, trouble swallowing and voice change.   Eyes: Negative for visual disturbance.  Respiratory: Positive for cough and shortness of breath. Negative for choking.   Cardiovascular: Positive for chest pain. Negative for leg swelling.  Gastrointestinal: Negative for vomiting, abdominal pain and diarrhea.       Acid heartburn  Genitourinary: Negative for difficulty urinating.  Musculoskeletal: Negative for arthralgias.  Skin: Negative for rash.  Neurological: Negative for tremors, syncope and headaches.  Hematological: Does not bruise/bleed easily.       Objective:   Physical Exam  amb pleasant bf nad  Wt Readings from Last 3 Encounters:  12/11/15 261 lb (118.389 kg)  02/17/14 249 lb (112.946 kg)  02/16/14 249 lb (112.946 kg)    Vital signs reviewed   HEENT: nl dentition, turbinates, and oropharynx. Nl external ear canals without cough reflex   NECK :  without JVD/Nodes/TM/ nl carotid upstrokes bilaterally   LUNGS: no acc muscle use,  Nl contour chest which is clear to A and P bilaterally without cough on insp or exp maneuvers   CV:  RRR  no s3 or murmur or increase in P2, no edema   ABD:  soft and nontender with nl inspiratory excursion in the supine position. No bruits or organomegaly, bowel sounds nl  MS:  Nl gait/ ext warm without deformities,  calf tenderness, cyanosis or clubbing No obvious joint restrictions   SKIN: warm and dry without lesions    NEURO:  alert, approp, nl sensorium with  no motor deficits     CXR PA and Lateral:   12/11/2015 :    I personally reviewed images and agree with radiology impression as follows:    Interim near complete clearing of bibasilar atelectasis and/or infiltrates.     Assessment & Plan:

## 2015-12-11 NOTE — Progress Notes (Signed)
Quick Note:  Spoke with pt and notified of results per Dr. [name]. Pt verbalized understanding and denied any questions.  ______ 

## 2015-12-12 DIAGNOSIS — R0789 Other chest pain: Secondary | ICD-10-CM | POA: Insufficient documentation

## 2015-12-12 NOTE — Assessment & Plan Note (Signed)
Onset early may 2017 in setting of refractory cough > so most likely intercostal muscle strain/tear > rx tramadol/ nsaids prn ok > f/u in 2 weeks if not better

## 2015-12-12 NOTE — Assessment & Plan Note (Addendum)
The most common causes of chronic cough in immunocompetent adults include the following: upper airway cough syndrome (UACS), previously referred to as postnasal drip syndrome (PNDS), which is caused by variety of rhinosinus conditions; (2) asthma; (3) GERD; (4) chronic bronchitis from cigarette smoking or other inhaled environmental irritants; (5) nonasthmatic eosinophilic bronchitis; and (6) bronchiectasis.   These conditions, singly or in combination, have accounted for up to 94% of the causes of chronic cough in prospective studies.   Other conditions have constituted no >6% of the causes in prospective studies These have included bronchogenic carcinoma, chronic interstitial pneumonia, sarcoidosis, left ventricular failure, ACEI-induced cough, and aspiration from a condition associated with pharyngeal dysfunction.    Chronic cough is often simultaneously caused by more than one condition. A single cause has been found from 38 to 82% of the time, multiple causes from 18 to 62%. Multiply caused cough has been the result of three diseases up to 42% of the time.       Of the three most common causes of chronic cough, only one (GERD which was the dx from her last pulmonary eval for chronic cough by Tommie Ard)   can actually cause the other two (asthma and post nasal drip syndrome)  and perpetuate the cylce of cough inducing airway trauma, inflammation, heightened sensitivity to reflux which is prompted by the cough itself via a cyclical mechanism.    This may partially respond to steroids and look like asthma and post nasal drainage but never erradicated completely unless the cough and the secondary reflux are eliminated, preferably both at the same time.  While not intuitively obvious, many patients with chronic low grade reflux do not cough until there is a secondary insult that disturbs the protective epithelial barrier and exposes sensitive nerve endings.  This can be viral or direct physical  injury such as with an endotracheal tube.   The point is that once this occurs, it is difficult to eliminate using anything but a maximally effective acid suppression regimen at least in the short run, accompanied by an appropriate diet to address non acid GERD.   rec max rx for gerd / cyclical cough then return if not satisfied with sinus CT next step   The standardized cough guidelines published in Chest by Lissa Morales in 2006 are still the best available and consist of a multiple step process (up to 12!) , not a single office visit,  and are intended  to address this problem logically,  with an alogrithm dependent on response to empiric treatment at  each progressive step  to determine a specific diagnosis with  minimal addtional testing needed. Therefore if adherence is an issue or can't be accurately verified,  it's very unlikely the standard evaluation and treatment will be successful here.    Furthermore, response to therapy (other than acute cough suppression, which should only be used short term with avoidance of narcotic containing cough syrups if possible), can be a gradual process for which the patient is not likely to  perceive immediate benefit.  Unlike going to an eye doctor where the best perscription is almost always the first one and is immediately effective, this is almost never the case in the management of chronic cough syndromes. Therefore the patient needs to commit up front to consistently adhere to recommendations  for up to 6 weeks of therapy directed at the likely underlying problem(s) before the response can be reasonably evaluated.   Total time devoted to counseling  = 35/39m  review case with pt/ discussion of options/alternatives/ personally creating written instructions  in presence of pt  then going over those specific  Instructions directly with the pt including how to use all of the meds but in particular covering each new medication in detail and the difference between  the maintenance/automatic meds and the prns using an action plan format for the latter.

## 2016-08-04 ENCOUNTER — Other Ambulatory Visit: Payer: Self-pay | Admitting: Internal Medicine

## 2016-11-28 ENCOUNTER — Other Ambulatory Visit: Payer: Self-pay | Admitting: Obstetrics and Gynecology

## 2016-11-28 DIAGNOSIS — N6310 Unspecified lump in the right breast, unspecified quadrant: Secondary | ICD-10-CM

## 2016-12-03 ENCOUNTER — Other Ambulatory Visit: Payer: Managed Care, Other (non HMO)

## 2016-12-09 ENCOUNTER — Other Ambulatory Visit: Payer: Self-pay | Admitting: Obstetrics and Gynecology

## 2017-12-03 ENCOUNTER — Encounter (HOSPITAL_COMMUNITY): Payer: Self-pay | Admitting: *Deleted

## 2017-12-03 ENCOUNTER — Emergency Department (HOSPITAL_COMMUNITY): Payer: 59

## 2017-12-03 ENCOUNTER — Observation Stay (HOSPITAL_COMMUNITY)
Admission: EM | Admit: 2017-12-03 | Discharge: 2017-12-05 | Disposition: A | Discharge status: Home / Self Care | Payer: 59 | Attending: Internal Medicine | Admitting: Internal Medicine

## 2017-12-03 ENCOUNTER — Other Ambulatory Visit: Payer: Self-pay

## 2017-12-03 DIAGNOSIS — D649 Anemia, unspecified: Secondary | ICD-10-CM | POA: Insufficient documentation

## 2017-12-03 DIAGNOSIS — Z87891 Personal history of nicotine dependence: Secondary | ICD-10-CM | POA: Diagnosis not present

## 2017-12-03 DIAGNOSIS — Z888 Allergy status to other drugs, medicaments and biological substances status: Secondary | ICD-10-CM | POA: Diagnosis not present

## 2017-12-03 DIAGNOSIS — G4700 Insomnia, unspecified: Secondary | ICD-10-CM | POA: Diagnosis not present

## 2017-12-03 DIAGNOSIS — Z881 Allergy status to other antibiotic agents status: Secondary | ICD-10-CM | POA: Insufficient documentation

## 2017-12-03 DIAGNOSIS — Z7951 Long term (current) use of inhaled steroids: Secondary | ICD-10-CM | POA: Diagnosis not present

## 2017-12-03 DIAGNOSIS — R918 Other nonspecific abnormal finding of lung field: Principal | ICD-10-CM | POA: Insufficient documentation

## 2017-12-03 DIAGNOSIS — Z91040 Latex allergy status: Secondary | ICD-10-CM | POA: Insufficient documentation

## 2017-12-03 DIAGNOSIS — G43909 Migraine, unspecified, not intractable, without status migrainosus: Secondary | ICD-10-CM | POA: Insufficient documentation

## 2017-12-03 DIAGNOSIS — Z8249 Family history of ischemic heart disease and other diseases of the circulatory system: Secondary | ICD-10-CM | POA: Diagnosis not present

## 2017-12-03 DIAGNOSIS — Z79899 Other long term (current) drug therapy: Secondary | ICD-10-CM | POA: Diagnosis not present

## 2017-12-03 DIAGNOSIS — Z9889 Other specified postprocedural states: Secondary | ICD-10-CM | POA: Diagnosis not present

## 2017-12-03 DIAGNOSIS — F329 Major depressive disorder, single episode, unspecified: Secondary | ICD-10-CM | POA: Diagnosis not present

## 2017-12-03 DIAGNOSIS — K219 Gastro-esophageal reflux disease without esophagitis: Secondary | ICD-10-CM | POA: Insufficient documentation

## 2017-12-03 DIAGNOSIS — J309 Allergic rhinitis, unspecified: Secondary | ICD-10-CM | POA: Insufficient documentation

## 2017-12-03 DIAGNOSIS — R0781 Pleurodynia: Secondary | ICD-10-CM | POA: Insufficient documentation

## 2017-12-03 DIAGNOSIS — R079 Chest pain, unspecified: Secondary | ICD-10-CM

## 2017-12-03 DIAGNOSIS — K589 Irritable bowel syndrome without diarrhea: Secondary | ICD-10-CM | POA: Insufficient documentation

## 2017-12-03 DIAGNOSIS — F419 Anxiety disorder, unspecified: Secondary | ICD-10-CM | POA: Diagnosis not present

## 2017-12-03 LAB — BASIC METABOLIC PANEL
Anion gap: 10 (ref 5–15)
BUN: 9 mg/dL (ref 6–20)
CO2: 25 mmol/L (ref 22–32)
CREATININE: 0.72 mg/dL (ref 0.44–1.00)
Calcium: 9.2 mg/dL (ref 8.9–10.3)
Chloride: 105 mmol/L (ref 101–111)
GFR calc Af Amer: >60 mL/min (ref >60)
GFR calc non Af Amer: >60 mL/min (ref >60)
GLUCOSE: 101 mg/dL — AB (ref 65–99)
Potassium: 4.1 mmol/L (ref 3.5–5.1)
Sodium: 140 mmol/L (ref 135–145)

## 2017-12-03 LAB — I-STAT TROPONIN, ED: Troponin i, poc: 0 ng/mL (ref 0.00–0.08)

## 2017-12-03 LAB — I-STAT BETA HCG BLOOD, ED (MC, WL, AP ONLY): I-stat hCG, quantitative: <5 m[IU]/mL (ref <5)

## 2017-12-03 LAB — CBC
HCT: 38.5 % (ref 36.0–46.0)
Hemoglobin: 12.5 g/dL (ref 12.0–15.0)
MCH: 27.9 pg (ref 26.0–34.0)
MCHC: 32.5 g/dL (ref 30.0–36.0)
MCV: 85.9 fL (ref 78.0–100.0)
PLATELETS: 297 10*3/uL (ref 150–400)
RBC: 4.48 MIL/uL (ref 3.87–5.11)
RDW: 14.5 % (ref 11.5–15.5)
WBC: 6.1 10*3/uL (ref 4.0–10.5)

## 2017-12-03 MED ORDER — ONDANSETRON HCL 4 MG PO TABS: 4.0000 mg | ORAL_TABLET | Freq: Four times a day (QID) | ORAL | Status: DC | PRN

## 2017-12-03 MED ORDER — ACETAMINOPHEN 650 MG RE SUPP: 650.0000 mg | Freq: Four times a day (QID) | RECTAL | Status: DC | PRN

## 2017-12-03 MED ORDER — SERTRALINE HCL 100 MG PO TABS
200.0000 mg | ORAL_TABLET | Freq: Every day | ORAL | Status: DC
  Administered 2017-12-03 – 2017-12-04 (×2): 200 mg via ORAL
  Filled 2017-12-03 (×2): qty 2

## 2017-12-03 MED ORDER — OXYCODONE HCL 5 MG PO TABS
5.0000 mg | ORAL_TABLET | ORAL | Status: DC | PRN
  Administered 2017-12-05: 5 mg via ORAL
  Filled 2017-12-03: qty 1

## 2017-12-03 MED ORDER — FLUTICASONE PROPIONATE 50 MCG/ACT NA SUSP
2.0000 | Freq: Every day | NASAL | Status: DC
  Administered 2017-12-04: 2 via NASAL
  Filled 2017-12-03: qty 16

## 2017-12-03 MED ORDER — LORATADINE 10 MG PO TABS
10.0000 mg | ORAL_TABLET | Freq: Every day | ORAL | Status: DC
  Administered 2017-12-03 – 2017-12-05 (×3): 10 mg via ORAL
  Filled 2017-12-03 (×3): qty 1

## 2017-12-03 MED ORDER — ACETAMINOPHEN 325 MG PO TABS
650.0000 mg | ORAL_TABLET | Freq: Four times a day (QID) | ORAL | Status: DC | PRN
  Administered 2017-12-05: 650 mg via ORAL
  Filled 2017-12-03: qty 2

## 2017-12-03 MED ORDER — IOPAMIDOL (ISOVUE-370) INJECTION 76%
100.0000 mL | Freq: Once | INTRAVENOUS | Status: AC | PRN
  Administered 2017-12-03: 100 mL via INTRAVENOUS

## 2017-12-03 MED ORDER — AMITRIPTYLINE HCL 25 MG PO TABS
25.0000 mg | ORAL_TABLET | Freq: Every day | ORAL | Status: DC
  Administered 2017-12-03 – 2017-12-04 (×2): 25 mg via ORAL
  Filled 2017-12-03 (×2): qty 1

## 2017-12-03 MED ORDER — MORPHINE SULFATE (PF) 4 MG/ML IV SOLN
2.0000 mg | INTRAVENOUS | Status: DC | PRN
Start: 2017-12-03 — End: 2017-12-05
  Administered 2017-12-03 – 2017-12-04 (×3): 2 mg via INTRAVENOUS
  Filled 2017-12-03 (×3): qty 1

## 2017-12-03 MED ORDER — ONDANSETRON HCL 4 MG/2ML IJ SOLN: 4.0000 mg | Freq: Four times a day (QID) | INTRAMUSCULAR | Status: DC | PRN

## 2017-12-03 MED ORDER — KETOROLAC TROMETHAMINE 30 MG/ML IJ SOLN
30.0000 mg | Freq: Four times a day (QID) | INTRAMUSCULAR | Status: DC | PRN
  Administered 2017-12-03 – 2017-12-04 (×3): 30 mg via INTRAVENOUS
  Filled 2017-12-03 (×3): qty 1

## 2017-12-03 MED ORDER — IOPAMIDOL (ISOVUE-370) INJECTION 76%
INTRAVENOUS | Status: AC
  Filled 2017-12-03: qty 100

## 2017-12-03 MED ORDER — MONTELUKAST SODIUM 10 MG PO TABS
10.0000 mg | ORAL_TABLET | Freq: Every day | ORAL | Status: DC
  Administered 2017-12-03 – 2017-12-04 (×2): 10 mg via ORAL
  Filled 2017-12-03 (×2): qty 1

## 2017-12-03 NOTE — ED Triage Notes (Signed)
Pt complains of chest pain,cough and shortness of breath for the past week. Pain is worse with cough and movement.

## 2017-12-03 NOTE — H&P (Signed)
History and Physical    Yvonne Banks VHQ:[id number] DOB: 1971-06-13 DOA: 12/03/2017  PCP: Jonathon Jordan, MD Consultants:  Mancel Bale - OB/GYN Patient coming from:  Home - lives with sister; NOK: sister, Olivia Mackie [phone]  Chief Complaint: chest pain  HPI: Yvonne Banks is a 47 y.o. female with medical history significant of HSV, chlamydia, IBS, migraines, seasonal allergies, and GERD presenting with chest pain.  Patient developed right-sided chest pain.  It started as a little bit of discomfort with cough or movement.  The pain increased and she noticed it with moving her arm, lying flat on her stomach, deep breathing.  Today, it was excruciating.  It has been there for a couple of weeks.  She has had allergies and has had a light cough - she thought her muscles were sore from coughing but her cough improved and the pain worsened.  The pain is particularly pleuritic.  Nonproductive cough.  She has some hot flashes and so she is unsure if she is having night sweats.  She does not think she has had weight changes.  Denies swollen glands.  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  Last foreign travel was in 2013 - she had shots before travel but denies having ever had TB.  She did start amitriptyline a few weeks ago to prevent migraines.  Interestingly, there is a note from 5/17 when she saw Dr. Melvyn Novas for refractory cough - thought to be most likely related to intercostal muscle strain/tear.  It does not appear that she followed up and so perhaps this issue resolved completely in the interim.    ED Course:   Presented with chest pain, CT concerning for RUL nodules.  H/o trip to [place] with infection - ?TB.  Concern for malignancy, but somewhat atypical.  Plan is for observation for bronch vs. FNA and pain control.  Pulmonology was consulted.  Review of Systems: As per HPI; otherwise review of systems reviewed and negative.   Ambulatory Status:   Ambulates without assistance  Past Medical History:  Diagnosis Date  . Allergy   . Anemia menometrorrhaghia   history  . Anxiety    no meds  . Bilateral knee pain   . Complication of anesthesia    woke up coughing after surgery May 2014 - required inhaler in recovery and for a week after that surgery--pt thinks it was Symbicort  . Fibroids   . Gallstones    causing nausea and and midchest -abdominal pain  . GERD (gastroesophageal reflux disease)    History - resolved treatment with diet - no meds  . Herpes genitalis in women   . History of chlamydia   . IBS (irritable bowel syndrome)   . Insomnia   . Migraines    topamax  . MRSA (methicillin resistant staph aureus) culture positive    skin infection7/2013   PT STATES NO INFECTION AT PRESENT AND SHE IS FOLLOWING HER DOCTOR'S ORDERS TO USE MUPIROCIN  OINTMENT BID IN NOSE FIRST 5 DAYS OF EACH MONTH THRU DEC 2014 AND HAS HIBICLENS TO [place] THE AREAS WHERE THE INFECTION HAS BEEN.  . PMS (premenstrual syndrome)   . Shortness of breath    with exertion  - pt feels weight related  . Thyromegaly   . Vitamin D deficiency     Past Surgical History:  Procedure Laterality Date  . BLADDER SURGERY     as a child urethea stretched  . CHOLECYSTECTOMY N/A  05/19/2013   Procedure: LAPAROSCOPIC CHOLECYSTECTOMY WITH INTRAOPERATIVE CHOLANGIOGRAM;  Surgeon: Madilyn Hook, DO;  Location: [facility];  Service: General;  Laterality: N/A;  . DILATATION & CURRETTAGE/HYSTEROSCOPY WITH RESECTOCOPE N/A 11/05/2012   Procedure: [place];  Surgeon: [name], MD;  Location: [facility];  Service: Gynecology;  Laterality: N/A;  . DILATION AND CURETTAGE OF UTERUS  11/2013  . IUD REMOVAL     skyla removed 02/10/14 in doctor's office  . MYOMECTOMY N/A 02/17/2014   Procedure: Exploratory Laparotomy MYOMECTOMY;  Surgeon: Marvene Staff, MD;  Location: [facility];  Service: Gynecology;  Laterality: N/A;  . skyla iud  01-14-13    . WISDOM TOOTH EXTRACTION      Social History   Socioeconomic History  . Marital status: Single    Spouse name: Not on file  . Number of children: Not on file  . Years of education: Not on file  . Highest education level: Not on file  Occupational History  . Occupation: Forensic scientist  Social Needs  . Financial resource strain: Not on file  . Food insecurity:    Worry: Not on file    Inability: Not on file  . Transportation needs:    Medical: Not on file    Non-medical: Not on file  Tobacco Use  . Smoking status: Former Smoker    Packs/day: 1.00    Years: 15.00    Pack years: 15.00    Types: Cigarettes    Last attempt to quit: 07/21/2000    Years since quitting: 17.3  . Smokeless tobacco: Never Used  Substance and Sexual Activity  . Alcohol use: Yes    Alcohol/week: 0.0 oz    Comment: rare  . Drug use: No  . Sexual activity: Never    Birth control/protection: Other-see comments, None    Comment: IUD removed 02/10/14  Lifestyle  . Physical activity:    Days per week: Not on file    Minutes per session: Not on file  . Stress: Not on file  Relationships  . Social connections:    Talks on phone: Not on file    Gets together: Not on file    Attends religious service: Not on file    Active member of club or organization: Not on file    Attends meetings of clubs or organizations: Not on file    Relationship status: Not on file  . Intimate partner violence:    Fear of current or ex partner: Not on file    Emotionally abused: Not on file    Physically abused: Not on file    Forced sexual activity: Not on file  Other Topics Concern  . Not on file  Social History Narrative  . Not on file    Allergies  Allergen Reactions  . Adhesive [Tape] Other (See Comments)    Causes skin discoloration.  . Erythromycin Nausea And Vomiting  . Iron     Stomach pain. Pt can take Integra for iron  . Latex Other (See Comments)    Discolors skin  . Other      Spicy peppers - anything hot or spicy causes swelling and skin inflammation    Family History  Problem Relation Age of Onset  . Anemia Mother   . Hypertension Mother   . Asthma Mother   . Allergies Mother   . Ulcers Sister   . Cancer Maternal Grandmother        colon  . Asthma Maternal  Aunt   . Allergies Maternal Aunt   . Allergies Sister   . Breast cancer Paternal Aunt   . Lung cancer Cousin     Prior to Admission medications   Medication Sig Start Date End Date Taking? Authorizing Provider  amitriptyline (ELAVIL) 25 MG tablet Take 25 mg by mouth at bedtime.   Yes [provider]  cetirizine (ZYRTEC) 10 MG tablet Take 10 mg by mouth daily as needed for allergies.    Yes [provider]  fluticasone (FLONASE) 50 MCG/ACT nasal spray Place 2 sprays into both nostrils daily.   Yes [provider]  montelukast (SINGULAIR) 10 MG tablet Take 10 mg by mouth at bedtime.   Yes [provider]  rizatriptan (MAXALT) 10 MG tablet Take 10 mg by mouth. 12/01/17  Yes [provider]  sertraline (ZOLOFT) 100 MG tablet 200 mg at bedtime.  05/10/12  Yes [provider]  acetaminophen-codeine (TYLENOL #3) 300-30 MG tablet One every 4 hours as needed for cough Patient not taking: Reported on 12/03/2017 12/11/15   Tanda Rockers, MD  famotidine (PEPCID) 20 MG tablet One at bedtime Patient not taking: Reported on 12/03/2017 12/11/15   Tanda Rockers, MD  pantoprazole (PROTONIX) 40 MG tablet Take 1 tablet (40 mg total) by mouth daily. Take 30-60 min before first meal of the day Patient not taking: Reported on 12/03/2017 12/11/15   Tanda Rockers, MD  predniSONE (DELTASONE) 10 MG tablet Take  4 each am x 2 days,   2 each am x 2 days,  1 each am x 2 days and stop Patient not taking: Reported on 12/03/2017 12/11/15   Tanda Rockers, MD    Physical Exam: Vitals:   12/03/17 1600 12/03/17 1830 12/03/17 1900 12/03/17 2000  BP: (!) 129/93 113/71 129/74 126/69   Pulse: 90 91 85 92  Resp: (!) 23 (!) 28 19 17   Temp:      TempSrc:      SpO2: 99% 100% 100% 98%     General:  Appears calm and comfortable and is NAD Eyes:  PERRL, EOMI, normal lids, iris ENT:  grossly normal hearing, lips & tongue, mmm; appropriate dentition Neck:  no LAD, masses or thyromegaly; no supraclavicular LAD appreciated Cardiovascular:  RRR, no m/r/g. No LE edema.  Respiratory:   CTA bilaterally with no wheezes/rales/rhonchi.  Normal respiratory effort. Abdomen:  soft, NT, ND, NABS Skin:  no rash or induration seen on limited exam Musculoskeletal:  grossly normal tone BUE/BLE, good ROM, no bony abnormality Psychiatric:  grossly normal mood and affect, speech fluent and appropriate, AOx3 Neurologic:  CN 2-12 grossly intact, moves all extremities in coordinated fashion, sensation intact    Radiological Exams on Admission: Dg Chest 2 View  Result Date: 12/03/2017 CLINICAL DATA:  Chest pain EXAM: CHEST - 2 VIEW COMPARISON:  12/11/2015 FINDINGS: Lingular linear densities, likely atelectasis. Concern for nodular areas in the right upper lobe in the right suprahilar region and right apex. No effusions. Heart is normal size. No acute bony abnormality. IMPRESSION: Question right upper lobe nodules or early consolidation. This could be further evaluated with chest CT. Lingular atelectasis. Electronically Signed   By: Rolm Baptise M.D.   On: 12/03/2017 09:23   Ct Angio Chest Pe W And/or Wo Contrast  Result Date: 12/03/2017 CLINICAL DATA:  Pt complains of chest pain,cough and shortness of breath for the past week. Pain is worse with cough and movement. EXAM: CT ANGIOGRAPHY CHEST WITH CONTRAST TECHNIQUE:  Multidetector CT imaging of the chest was performed using the standard protocol during bolus administration of intravenous contrast. Multiplanar CT image reconstructions and MIPs were obtained to evaluate the vascular anatomy. CONTRAST:  163mL ISOVUE-370 IOPAMIDOL (ISOVUE-370) INJECTION  76% COMPARISON:  Chest x-ray 12/03/2017 FINDINGS: Cardiovascular: Heart size is normal. There is trace pericardial effusion. No significant coronary artery calcifications. Thoracic aorta is normal in appearance. Pulmonary arteries are unremarkable. Mediastinum/Nodes: Prevascular lymph node is 1.2 centimeters. RIGHT hilar lymph node is 1.9 centimeters. Precarinal lymph node is 0.9 centimeters. RIGHT paratracheal lymph node is 1.0 centimeters. Lungs/Pleura: There are numerous masses primarily involving the RIGHT UPPER lobe but also a RIGHT LOWER lobe. These vary in size from 8 millimeters to 1.9 x 3.5 centimeters. There is perihilar peribronchial thickening particularly involving the RIGHT UPPER lobe bronchi. Airways are otherwise patent. Upper Abdomen: No acute abnormality. Musculoskeletal: No chest wall abnormality. No acute or significant osseous findings. Review of the MIP images confirms the above findings. IMPRESSION: 1. Technically adequate exam showing no acute pulmonary embolus. 2. Numerous RIGHT UPPER lobe masslike densities. Considerations include malignancy and sarcoidosis. Infectious/inflammatory process could have a similar appearance. The asymmetric appearance and significant adenopathy favors malignancy over inflammatory causes. 3. Recommend consultation with pulmonology. 4. These results were called by telephone at the time of interpretation on 12/03/2017 at 6:22 pm to Dr. Davonna Belling , who verbally acknowledged these results. Electronically Signed   By: Nolon Nations M.D.   On: 12/03/2017 18:23    EKG: Independently reviewed.  Sinus tachycardia with rate 100; low voltage with no evidence of acute ischemia   Labs on Admission: I have personally reviewed the available labs and imaging studies at the time of the admission.  Pertinent labs:   Glucose 101 Troponin 0.00 Normal CBC Negative upreg  Assessment/Plan Principal Problem:   Lung mass Active Problems:   Migraines   Lung  mass -Patient presenting with very clear description of pleuritic chest pain -Found to have RUL nodules with LAD on CT concerning for malignancy; ddx also includes infection and inflammatory process such as sarcoidosis -Will observe for pain control and to expedite diagnosis -Pulmonology was consulted tonight but it is not clear that they are planning to see the patient in the AM; would suggest that they be called in the AM for formal consult -Will also request IR consult  -The patient is likely to need a biopsy for diagnostic purposes -No antibiotics for now -While Dr. 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; will hold further quantiferon gold testing unless recommended by pulm tomorrow and will not place in negative pressure room at this time -Pain control with Tylenol, Toradol, Oxy IR, Morphine  Migraines -Continue qhs amitriptyline   DVT prophylaxis:  SCDs Code Status:  Full  Family Communication: I spoke with the patient's sister by telephone  Disposition Plan:  Home once clinically improved Consults called: Pulmonology (needs re-consult in AM) and IR  Admission status: It is my clinical opinion that referral for OBSERVATION is reasonable and necessary in this patient based on the above information provided. The aforementioned taken together are felt to place the patient at high risk for further clinical deterioration. However it is anticipated that the patient may be medically stable for discharge from the hospital within 24 to 48 hours.     Karmen Bongo MD [facility]  If note is complete, please contact covering daytime  or nighttime physician. [email or web address] Password [facility]  12/03/2017, 8:48 PM

## 2017-12-03 NOTE — ED Provider Notes (Signed)
[facility] DEPT Provider Note   CSN: [id number] Arrival date & time: 12/03/17  0825     History   Chief Complaint Chief Complaint  Patient presents with  . Chest Pain    HPI Yvonne Banks is a 47 y.o. female.  HPI Patient presents with right-sided chest pain shortness of breath  And dry cough for the last month.  No swelling in her legs.  No fevers.  Pain is worse with movement.  States she was unable to sleep on that side.  Does have history of allergies.  No fevers.  States she was around someone with respiratory symptoms but she was sick before she saw that person. Past Medical History:  Diagnosis Date  . Allergy   . Anemia menometrorrhaghia   history  . Anxiety    no meds  . Bilateral knee pain   . Complication of anesthesia    woke up coughing after surgery May 2014 - required inhaler in recovery and for a week after that surgery--pt thinks it was Symbicort  . Fibroids   . Gallstones    causing nausea and and midchest -abdominal pain  . GERD (gastroesophageal reflux disease)    History - resolved treatment with diet - no meds  . Herpes genitalis in women   . History of chlamydia   . IBS (irritable bowel syndrome)   . Insomnia   . Migraines    topamax  . MRSA (methicillin resistant staph aureus) culture positive    skin infection7/2013   PT STATES NO INFECTION AT PRESENT AND SHE IS FOLLOWING HER DOCTOR'S ORDERS TO USE MUPIROCIN  OINTMENT BID IN NOSE FIRST 5 DAYS OF EACH MONTH THRU DEC 2014 AND HAS HIBICLENS TO [place] THE AREAS WHERE THE INFECTION HAS BEEN.  . PMS (premenstrual syndrome)   . PMS (premenstrual syndrome)   . Shortness of breath    with exertion  - pt feels weight related  . Thyromegaly   . Vitamin D deficiency     Patient Active Problem List   Diagnosis Date Noted  . Musculoskeletal chest pain 12/12/2015  . Upper airway cough syndrome 12/11/2015  . S/P myomectomy 02/17/2014  . Menorrhagia 09/20/2012  .  Fibroids 09/20/2012  . Migraines 09/07/2012  . MRSA infection 06/28/2012  . HSV (herpes simplex virus) anogenital infection 06/28/2012    Past Surgical History:  Procedure Laterality Date  . BLADDER SURGERY     as a child urethea stretched  . CHOLECYSTECTOMY N/A 05/19/2013   Procedure: LAPAROSCOPIC CHOLECYSTECTOMY WITH INTRAOPERATIVE CHOLANGIOGRAM;  Surgeon: Madilyn Hook, DO;  Location: [facility];  Service: General;  Laterality: N/A;  . DILATATION & CURRETTAGE/HYSTEROSCOPY WITH RESECTOCOPE N/A 11/05/2012   Procedure: [place];  Surgeon: [name], MD;  Location: [facility];  Service: Gynecology;  Laterality: N/A;  . DILATION AND CURETTAGE OF UTERUS  11/2013  . IUD REMOVAL     skyla removed 02/10/14 in doctor's office  . MYOMECTOMY N/A 02/17/2014   Procedure: Exploratory Laparotomy MYOMECTOMY;  Surgeon: Marvene Staff, MD;  Location: [facility];  Service: Gynecology;  Laterality: N/A;  . skyla iud  01-14-13  . WISDOM TOOTH EXTRACTION       OB History    Gravida  0   Para      Term      Preterm      AB      Living        SAB  TAB      Ectopic      Multiple      Live Births               Home Medications    Prior to Admission medications   Medication Sig Start Date End Date Taking? Authorizing Provider  amitriptyline (ELAVIL) 25 MG tablet Take 25 mg by mouth at bedtime.   Yes [provider]  cetirizine (ZYRTEC) 10 MG tablet Take 10 mg by mouth daily as needed for allergies.    Yes [provider]  fluticasone (FLONASE) 50 MCG/ACT nasal spray Place 2 sprays into both nostrils daily.   Yes [provider]  montelukast (SINGULAIR) 10 MG tablet Take 10 mg by mouth at bedtime.   Yes [provider]  rizatriptan (MAXALT) 10 MG tablet Take 10 mg by mouth. 12/01/17  Yes [provider]  sertraline (ZOLOFT) 100 MG tablet 200 mg at bedtime.  05/10/12  Yes [provider]   acetaminophen-codeine (TYLENOL #3) 300-30 MG tablet One every 4 hours as needed for cough Patient not taking: Reported on 12/03/2017 12/11/15   Tanda Rockers, MD  famotidine (PEPCID) 20 MG tablet One at bedtime Patient not taking: Reported on 12/03/2017 12/11/15   Tanda Rockers, MD  pantoprazole (PROTONIX) 40 MG tablet Take 1 tablet (40 mg total) by mouth daily. Take 30-60 min before first meal of the day Patient not taking: Reported on 12/03/2017 12/11/15   Tanda Rockers, MD  predniSONE (DELTASONE) 10 MG tablet Take  4 each am x 2 days,   2 each am x 2 days,  1 each am x 2 days and stop Patient not taking: Reported on 12/03/2017 12/11/15   Tanda Rockers, MD    Family History Family History  Problem Relation Age of Onset  . Anemia Mother   . Hypertension Mother   . Asthma Mother   . Allergies Mother   . Ulcers Sister   . Cancer Maternal Grandmother        colon  . Asthma Maternal Aunt   . Allergies Maternal Aunt   . Allergies Sister   . Breast cancer Paternal Aunt     Social History Social History   Tobacco Use  . Smoking status: Former Smoker    Packs/day: 1.00    Years: 10.00    Pack years: 10.00    Types: Cigarettes    Last attempt to quit: 07/21/2000    Years since quitting: 17.3  . Smokeless tobacco: Never Used  Substance Use Topics  . Alcohol use: No    Alcohol/week: 0.0 oz  . Drug use: No     Allergies   Adhesive [tape]; Erythromycin; Iron; Latex; and Other   Review of Systems Review of Systems  Constitutional: Negative for appetite change and fever.  HENT: Negative for congestion.   Respiratory: Positive for cough and shortness of breath.   Cardiovascular: Positive for chest pain. Negative for leg swelling.  Gastrointestinal: Negative for abdominal pain.  Genitourinary: Negative for urgency.  Musculoskeletal: Negative for back pain.  Skin: Negative for rash.  Neurological: Negative for numbness.  Hematological: Negative for adenopathy.    Psychiatric/Behavioral: Negative for confusion.     Physical Exam Updated Vital Signs BP 113/71   Pulse 91   Temp 98.8 F (37.1 C) (Oral)   Resp (!) 28   LMP 06/05/2017   SpO2 100%   Physical Exam  Constitutional: She appears well-developed.  HENT:  Head: Normocephalic.  Neck: Neck supple.  Cardiovascular: Regular rhythm.  No murmur heard. Pulmonary/Chest:  Tenderness right upper anterior chest wall with some focal rhonchorous wheezes in his lung field.  Musculoskeletal:       Right lower leg: She exhibits no edema.       Left lower leg: She exhibits no edema.  Neurological: She is alert.  Skin: Skin is warm. Capillary refill takes less than 2 seconds.     ED Treatments / Results  Labs (all labs ordered are listed, but only abnormal results are displayed) Labs Reviewed  BASIC METABOLIC PANEL - Abnormal; Notable for the following components:      Result Value   Glucose, Bld 101 (*)    All other components within normal limits  CBC  I-STAT TROPONIN, ED  I-STAT BETA HCG BLOOD, ED ([facility], [facility], [facility] ONLY)    EKG EKG Interpretation  Date/Time:  Thursday Dec 03 2017 08:40:05 EDT Ventricular Rate:  100 PR Interval:    QRS Duration: 80 QT Interval:  337 QTC Calculation: 435 R Axis:   18 Text Interpretation:  Sinus tachycardia Low voltage, precordial leads Confirmed by Davonna Belling [phone]) on 12/03/2017 3:00:41 PM   Radiology Dg Chest 2 View  Result Date: 12/03/2017 CLINICAL DATA:  Chest pain EXAM: CHEST - 2 VIEW COMPARISON:  12/11/2015 FINDINGS: Lingular linear densities, likely atelectasis. Concern for nodular areas in the right upper lobe in the right suprahilar region and right apex. No effusions. Heart is normal size. No acute bony abnormality. IMPRESSION: Question right upper lobe nodules or early consolidation. This could be further evaluated with chest CT. Lingular atelectasis. Electronically Signed   By: Rolm Baptise M.D.   On: 12/03/2017 09:23   Ct Angio  Chest Pe W And/or Wo Contrast  Result Date: 12/03/2017 CLINICAL DATA:  Pt complains of chest pain,cough and shortness of breath for the past week. Pain is worse with cough and movement. EXAM: CT ANGIOGRAPHY CHEST WITH CONTRAST TECHNIQUE: Multidetector CT imaging of the chest was performed using the standard protocol during bolus administration of intravenous contrast. Multiplanar CT image reconstructions and MIPs were obtained to evaluate the vascular anatomy. CONTRAST:  114mL ISOVUE-370 IOPAMIDOL (ISOVUE-370) INJECTION 76% COMPARISON:  Chest x-ray 12/03/2017 FINDINGS: Cardiovascular: Heart size is normal. There is trace pericardial effusion. No significant coronary artery calcifications. Thoracic aorta is normal in appearance. Pulmonary arteries are unremarkable. Mediastinum/Nodes: Prevascular lymph node is 1.2 centimeters. RIGHT hilar lymph node is 1.9 centimeters. Precarinal lymph node is 0.9 centimeters. RIGHT paratracheal lymph node is 1.0 centimeters. Lungs/Pleura: There are numerous masses primarily involving the RIGHT UPPER lobe but also a RIGHT LOWER lobe. These vary in size from 8 millimeters to 1.9 x 3.5 centimeters. There is perihilar peribronchial thickening particularly involving the RIGHT UPPER lobe bronchi. Airways are otherwise patent. Upper Abdomen: No acute abnormality. Musculoskeletal: No chest wall abnormality. No acute or significant osseous findings. Review of the MIP images confirms the above findings. IMPRESSION: 1. Technically adequate exam showing no acute pulmonary embolus. 2. Numerous RIGHT UPPER lobe masslike densities. Considerations include malignancy and sarcoidosis. Infectious/inflammatory process could have a similar appearance. The asymmetric appearance and significant adenopathy favors malignancy over inflammatory causes. 3. Recommend consultation with pulmonology. 4. These results were called by telephone at the time of interpretation on 12/03/2017 at 6:22 pm to Dr. Davonna Belling , who verbally acknowledged these results. Electronically Signed   By: Nolon Nations M.D.   On: 12/03/2017 18:23    Procedures Procedures (including critical care time)  Medications Ordered in ED Medications  iopamidol (ISOVUE-370) 76 % injection (has no administration in time range)  iopamidol (ISOVUE-370) 76 % injection 100 mL (100 mLs Intravenous Contrast Given 12/03/17 1641)     Initial Impression / Assessment and Plan / ED Course  I have reviewed the triage vital signs and the nursing notes.  Pertinent labs & imaging results that were available during my care of the patient were reviewed by me and considered in my medical decision making (see chart for details).     Patient with chest pain and abnormal chest x-ray findings.  Continued right upper chest pain.  Localizing lung findings on exam and CT scan showed multiple masses in the right upper lobe.  Discussed with Dr. Owens Shark from radiology and Dr.Rosenblatt from pulmonary.  Malignancy considered but somewhat atypical.  Will need a tissue sampling of this likely either by bronc or fine-needle aspiration.  Discussed with pulmonology and think she may benefit from admission to the hospital.  Will admit to hospitalist.  Final Clinical Impressions(s) / ED Diagnoses   Final diagnoses:  Chest pain, unspecified type  Lung mass    ED Discharge Orders    None       Davonna Belling, MD 12/03/17 [phone]

## 2017-12-03 NOTE — ED Notes (Signed)
ED TO INPATIENT HANDOFF REPORT  Name/Age/Gender Yvonne Banks 47 y.o. female  Code Status Code Status History    Date Active Date Inactive Code Status Order ID Comments User Context   02/17/2014 1658 02/19/2014 1505 Full Code [id number]  Marvene Staff, MD Inpatient   05/19/2013 1209 05/21/2013 1714 Full Code [id number]  Madilyn Hook, DO Inpatient      Home/SNF/Other Home  Chief Complaint chest pain   Level of Care/Admitting Diagnosis ED Disposition    ED Disposition Condition [place]: [facility] [[id number]]  Level of Care: Med-Surg [16]  Diagnosis: Lung mass [060045]  Admitting Physician: Karmen Bongo [[id number]]  Attending Physician: Karmen Bongo [[id number]]  PT Class (Do Not Modify): Observation [104]  PT Acc Code (Do Not Modify): Observation [10022]       Medical History Past Medical History:  Diagnosis Date  . Allergy   . Anemia menometrorrhaghia   history  . Anxiety    no meds  . Bilateral knee pain   . Complication of anesthesia    woke up coughing after surgery May 2014 - required inhaler in recovery and for a week after that surgery--pt thinks it was Symbicort  . Fibroids   . Gallstones    causing nausea and and midchest -abdominal pain  . GERD (gastroesophageal reflux disease)    History - resolved treatment with diet - no meds  . Herpes genitalis in women   . History of chlamydia   . IBS (irritable bowel syndrome)   . Insomnia   . Migraines    topamax  . MRSA (methicillin resistant staph aureus) culture positive    skin infection7/2013   PT STATES NO INFECTION AT PRESENT AND SHE IS FOLLOWING HER DOCTOR'S ORDERS TO USE MUPIROCIN  OINTMENT BID IN NOSE FIRST 5 DAYS OF EACH MONTH THRU DEC 2014 AND HAS HIBICLENS TO [place] THE AREAS WHERE THE INFECTION HAS BEEN.  . PMS (premenstrual syndrome)   . Shortness of breath    with exertion  - pt feels weight related  . Thyromegaly   . Vitamin D deficiency      Allergies Allergies  Allergen Reactions  . Adhesive [Tape] Other (See Comments)    Causes skin discoloration.  . Erythromycin Nausea And Vomiting  . Iron     Stomach pain. Pt can take Integra for iron  . Latex Other (See Comments)    Discolors skin  . Other     Spicy peppers - anything hot or spicy causes swelling and skin inflammation    IV Location/Drains/Wounds Patient Lines/Drains/Airways Status   Active Line/Drains/Airways    Name:   Placement date:   Placement time:   Site:   Days:   Peripheral IV 02/17/14 Left Forearm   02/17/14    1147    Forearm   1385   Peripheral IV 12/03/17 Right Forearm   12/03/17    1522    Forearm   less than 1   Incision 05/19/13 Abdomen Right   05/19/13    0924     1659   Incision (Closed) 02/17/14 Perineum Other (Comment)   02/17/14    1422     1385   Incision (Closed) 02/17/14 Abdomen Other (Comment)   02/17/14    1422     1385   Incision - 4 Ports Abdomen Umbilicus Left;Superior Right;Lateral Right;Lateral   05/19/13    9977     4142  Labs/Imaging Results for orders placed or performed during the hospital encounter of 12/03/17 (from the past 48 hour(s))  Basic metabolic panel     Status: Abnormal   Collection Time: 12/03/17 12:02 PM  Result Value Ref Range   Sodium 140 135 - 145 mmol/L   Potassium 4.1 3.5 - 5.1 mmol/L   Chloride 105 101 - 111 mmol/L   CO2 25 22 - 32 mmol/L   Glucose, Bld 101 (H) 65 - 99 mg/dL   BUN 9 6 - 20 mg/dL   Creatinine, Ser 0.72 0.44 - 1.00 mg/dL   Calcium 9.2 8.9 - 10.3 mg/dL   GFR calc non Af Amer >60 >60 mL/min   GFR calc Af Amer >60 >60 mL/min    Comment: (NOTE) The eGFR has been calculated using the CKD EPI equation. This calculation has not been validated in all clinical situations. eGFR's persistently <60 mL/min signify possible Chronic Kidney Disease.    Anion gap 10 5 - 15    Comment: Performed at [facility], [place] [address], [place], [place] [address]  CBC      Status: None   Collection Time: 12/03/17 12:02 PM  Result Value Ref Range   WBC 6.1 4.0 - 10.5 K/uL   RBC 4.48 3.87 - 5.11 MIL/uL   Hemoglobin 12.5 12.0 - 15.0 g/dL   HCT 38.5 36.0 - 46.0 %   MCV 85.9 78.0 - 100.0 fL   MCH 27.9 26.0 - 34.0 pg   MCHC 32.5 30.0 - 36.0 g/dL   RDW 14.5 11.5 - 15.5 %   Platelets 297 150 - 400 K/uL    Comment: Performed at [facility], [place] [address] [place], [place] [address]  I-stat troponin, ED     Status: None   Collection Time: 12/03/17 12:10 PM  Result Value Ref Range   Troponin i, poc 0.00 0.00 - 0.08 ng/mL   Comment 3            Comment: Due to the release kinetics of cTnI, a negative result within the first hours of the onset of symptoms does not rule out myocardial infarction with certainty. If myocardial infarction is still suspected, repeat the test at appropriate intervals.   I-Stat beta hCG blood, ED     Status: None   Collection Time: 12/03/17 12:11 PM  Result Value Ref Range   I-stat hCG, quantitative <5.0 <5 mIU/mL   Comment 3            Comment:   GEST. AGE      CONC.  (mIU/mL)   <=1 WEEK        5 - 50     2 WEEKS       50 - 500     3 WEEKS       100 - 10,000     4 WEEKS     1,000 - 30,000        FEMALE AND NON-PREGNANT FEMALE:     LESS THAN 5 mIU/mL    Dg Chest 2 View  Result Date: 12/03/2017 CLINICAL DATA:  Chest pain EXAM: CHEST - 2 VIEW COMPARISON:  12/11/2015 FINDINGS: Lingular linear densities, likely atelectasis. Concern for nodular areas in the right upper lobe in the right suprahilar region and right apex. No effusions. Heart is normal size. No acute bony abnormality. IMPRESSION: Question right upper lobe nodules or early consolidation. This could be further evaluated with chest CT. Lingular atelectasis. Electronically Signed  By: Rolm Baptise M.D.   On: 12/03/2017 09:23   Ct Angio Chest Pe W And/or Wo Contrast  Result Date: 12/03/2017 CLINICAL DATA:  Pt complains of chest pain,cough and shortness  of breath for the past week. Pain is worse with cough and movement. EXAM: CT ANGIOGRAPHY CHEST WITH CONTRAST TECHNIQUE: Multidetector CT imaging of the chest was performed using the standard protocol during bolus administration of intravenous contrast. Multiplanar CT image reconstructions and MIPs were obtained to evaluate the vascular anatomy. CONTRAST:  131m ISOVUE-370 IOPAMIDOL (ISOVUE-370) INJECTION 76% COMPARISON:  Chest x-ray 12/03/2017 FINDINGS: Cardiovascular: Heart size is normal. There is trace pericardial effusion. No significant coronary artery calcifications. Thoracic aorta is normal in appearance. Pulmonary arteries are unremarkable. Mediastinum/Nodes: Prevascular lymph node is 1.2 centimeters. RIGHT hilar lymph node is 1.9 centimeters. Precarinal lymph node is 0.9 centimeters. RIGHT paratracheal lymph node is 1.0 centimeters. Lungs/Pleura: There are numerous masses primarily involving the RIGHT UPPER lobe but also a RIGHT LOWER lobe. These vary in size from 8 millimeters to 1.9 x 3.5 centimeters. There is perihilar peribronchial thickening particularly involving the RIGHT UPPER lobe bronchi. Airways are otherwise patent. Upper Abdomen: No acute abnormality. Musculoskeletal: No chest wall abnormality. No acute or significant osseous findings. Review of the MIP images confirms the above findings. IMPRESSION: 1. Technically adequate exam showing no acute pulmonary embolus. 2. Numerous RIGHT UPPER lobe masslike densities. Considerations include malignancy and sarcoidosis. Infectious/inflammatory process could have a similar appearance. The asymmetric appearance and significant adenopathy favors malignancy over inflammatory causes. 3. Recommend consultation with pulmonology. 4. These results were called by telephone at the time of interpretation on 12/03/2017 at 6:22 pm to Dr. NDavonna Belling, who verbally acknowledged these results. Electronically Signed   By: ENolon NationsM.D.   On: 12/03/2017  18:23    Pending Labs Unresulted Labs (From admission, onward)   None      Vitals/Pain Today's Vitals   12/03/17 1530 12/03/17 1600 12/03/17 1830 12/03/17 1900  BP: 124/76 (!) 129/93 113/71 129/74  Pulse: 76 90 91 85  Resp: (!) 24 (!) 23 (!) 28 19  Temp:      TempSrc:      SpO2: 92% 99% 100% 100%  PainSc:        Isolation Precautions No active isolations  Medications Medications  iopamidol (ISOVUE-370) 76 % injection 100 mL (100 mLs Intravenous Contrast Given 12/03/17 1641)    Mobility walks

## 2017-12-03 NOTE — ED Notes (Signed)
Called transport to move pt upstairs

## 2017-12-04 ENCOUNTER — Encounter (HOSPITAL_COMMUNITY)
Admission: EM | Disposition: A | Discharge status: Home / Self Care | Payer: Self-pay | Source: Home / Self Care | Attending: Emergency Medicine

## 2017-12-04 ENCOUNTER — Observation Stay (HOSPITAL_COMMUNITY): Payer: 59

## 2017-12-04 DIAGNOSIS — J309 Allergic rhinitis, unspecified: Secondary | ICD-10-CM | POA: Diagnosis not present

## 2017-12-04 DIAGNOSIS — F329 Major depressive disorder, single episode, unspecified: Secondary | ICD-10-CM | POA: Diagnosis not present

## 2017-12-04 DIAGNOSIS — G43809 Other migraine, not intractable, without status migrainosus: Secondary | ICD-10-CM | POA: Diagnosis not present

## 2017-12-04 DIAGNOSIS — R918 Other nonspecific abnormal finding of lung field: Secondary | ICD-10-CM

## 2017-12-04 HISTORY — PX: VIDEO BRONCHOSCOPY: SHX5072

## 2017-12-04 LAB — BASIC METABOLIC PANEL
Anion gap: 10 (ref 5–15)
BUN: 11 mg/dL (ref 6–20)
CALCIUM: 8.9 mg/dL (ref 8.9–10.3)
CO2: 25 mmol/L (ref 22–32)
CREATININE: 0.74 mg/dL (ref 0.44–1.00)
Chloride: 104 mmol/L (ref 101–111)
GFR calc Af Amer: >60 mL/min (ref >60)
GFR calc non Af Amer: >60 mL/min (ref >60)
Glucose, Bld: 102 mg/dL — ABNORMAL HIGH (ref 65–99)
Potassium: 3.9 mmol/L (ref 3.5–5.1)
SODIUM: 139 mmol/L (ref 135–145)

## 2017-12-04 LAB — BODY FLUID CELL COUNT WITH DIFFERENTIAL
Eos, Fluid: 4 %
Lymphs, Fluid: 9 %
Monocyte-Macrophage-Serous Fluid: 15 % — ABNORMAL LOW (ref 50–90)
NEUTROPHIL FLUID: 72 % — AB (ref 0–25)
WBC FLUID: 215 uL (ref 0–1000)

## 2017-12-04 LAB — CBC
HCT: 36.9 % (ref 36.0–46.0)
Hemoglobin: 11.9 g/dL — ABNORMAL LOW (ref 12.0–15.0)
MCH: 27.7 pg (ref 26.0–34.0)
MCHC: 32.2 g/dL (ref 30.0–36.0)
MCV: 86 fL (ref 78.0–100.0)
PLATELETS: 286 10*3/uL (ref 150–400)
RBC: 4.29 MIL/uL (ref 3.87–5.11)
RDW: 14.8 % (ref 11.5–15.5)
WBC: 5.3 10*3/uL (ref 4.0–10.5)

## 2017-12-04 LAB — PROTIME-INR
INR: 0.97
PROTHROMBIN TIME: 12.8 s (ref 11.4–15.2)

## 2017-12-04 LAB — HIV ANTIBODY (ROUTINE TESTING W REFLEX): HIV SCREEN 4TH GENERATION: NONREACTIVE

## 2017-12-04 SURGERY — BRONCHOSCOPY, WITH FLUOROSCOPY
Anesthesia: Moderate Sedation | Laterality: Bilateral

## 2017-12-04 MED ORDER — GUAIFENESIN-CODEINE 100-10 MG/5ML PO SOLN
5.0000 mL | ORAL | Status: DC | PRN
  Administered 2017-12-04 – 2017-12-05 (×3): 5 mL via ORAL
  Filled 2017-12-04 (×3): qty 5

## 2017-12-04 MED ORDER — MENTHOL 3 MG MT LOZG
1.0000 | LOZENGE | OROMUCOSAL | Status: DC | PRN
  Filled 2017-12-04: qty 9

## 2017-12-04 MED ORDER — FENTANYL CITRATE (PF) 100 MCG/2ML IJ SOLN
INTRAMUSCULAR | Status: AC
  Filled 2017-12-04: qty 4

## 2017-12-04 MED ORDER — PHENYLEPHRINE HCL 0.25 % NA SOLN
1.0000 | Freq: Four times a day (QID) | NASAL | Status: DC | PRN
  Filled 2017-12-04: qty 15

## 2017-12-04 MED ORDER — BUTAMBEN-TETRACAINE-BENZOCAINE 2-2-14 % EX AERO
1.0000 | INHALATION_SPRAY | Freq: Once | CUTANEOUS | Status: DC
  Filled 2017-12-04: qty 20

## 2017-12-04 MED ORDER — FENTANYL CITRATE (PF) 100 MCG/2ML IJ SOLN
INTRAMUSCULAR | Status: DC | PRN
  Administered 2017-12-04 (×3): 50 ug via INTRAVENOUS

## 2017-12-04 MED ORDER — MIDAZOLAM HCL 10 MG/2ML IJ SOLN
INTRAMUSCULAR | Status: DC | PRN
  Administered 2017-12-04: 1 mg via INTRAVENOUS
  Administered 2017-12-04 (×3): 2 mg via INTRAVENOUS

## 2017-12-04 MED ORDER — BUTALBITAL-APAP-CAFFEINE 50-325-40 MG PO TABS
1.0000 | ORAL_TABLET | Freq: Four times a day (QID) | ORAL | Status: DC | PRN
  Administered 2017-12-05: 1 via ORAL
  Filled 2017-12-04: qty 1

## 2017-12-04 MED ORDER — LIDOCAINE HCL 1 % IJ SOLN
INTRAMUSCULAR | Status: DC | PRN
  Administered 2017-12-04: 6 mL via RESPIRATORY_TRACT

## 2017-12-04 MED ORDER — MIDAZOLAM HCL 5 MG/ML IJ SOLN
INTRAMUSCULAR | Status: AC
  Filled 2017-12-04: qty 2

## 2017-12-04 MED ORDER — SODIUM CHLORIDE 0.9 % IV SOLN
Freq: Once | INTRAVENOUS | Status: AC
  Administered 2017-12-04: 12:00:00 via INTRAVENOUS

## 2017-12-04 MED ORDER — LIDOCAINE HCL 2 % EX GEL
1.0000 "application " | Freq: Once | CUTANEOUS | Status: DC
  Filled 2017-12-04: qty 5

## 2017-12-04 MED ORDER — IPRATROPIUM-ALBUTEROL 0.5-2.5 (3) MG/3ML IN SOLN
3.0000 mL | Freq: Four times a day (QID) | RESPIRATORY_TRACT | Status: DC | PRN
  Administered 2017-12-04 – 2017-12-05 (×2): 3 mL via RESPIRATORY_TRACT
  Filled 2017-12-04 (×2): qty 3

## 2017-12-04 NOTE — Progress Notes (Signed)
PROGRESS NOTE    Yvonne Banks  LZJ:[id number] DOB: 1970/11/09 DOA: 12/03/2017 PCP: Jonathon Jordan, MD   Brief Narrative:  HPI on 12/03/2017 by Dr. Karmen Bongo Yvonne Banks is a 47 y.o. female with medical history significant of HSV, chlamydia, IBS, migraines, seasonal allergies, and GERD presenting with chest pain.  Patient developed right-sided chest pain.  It started as a little bit of discomfort with cough or movement.  The pain increased and she noticed it with moving her arm, lying flat on her stomach, deep breathing.  Today, it was excruciating.  It has been there for a couple of weeks.  She has had allergies and has had a light cough - she thought her muscles were sore from coughing but her cough improved and the pain worsened.  The pain is particularly pleuritic.  Nonproductive cough.  She has some hot flashes and so she is unsure if she is having night sweats.  She does not think she has had weight changes.  Denies swollen glands.  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  Last foreign travel was in 2013 - she had shots before travel but denies having ever had TB.  She did start amitriptyline a few weeks ago to prevent migraines. Interestingly, there is a note from 5/17 when she saw Dr. Melvyn Novas for refractory cough - thought to be most likely related to intercostal muscle strain/tear.  It does not appear that she followed up and so perhaps this issue resolved completely in the interim.    Assessment & Plan   Right lung mass -CTA chest: No acute PE.  Numerous right upper lobe masslike densities. -Pulmonology consulted and appreciated, s/p bronchoscopy, BAL and transbronchial lung biopsies performed -Interventional radiology consulted and appreciated however recommends obtaining a PET scan prior to percutaneous needle biopsy -continue nebs PRN, pain control  Pleuritic chest pain -suspect secondary to the above -continue supportive  care and pain control -troponin negative  Migraines -Continue amitriptyline qHS -complains of mild headache  -will add on fioricet PRN  Allergic rhinitis -continue Claritin, Singulair, Flonase  Depression -Continue Zoloft  DVT Prophylaxis  SCDs  Code Status: Full  Family Communication: none at bedside  Disposition Plan: observation, suspect possible discharge in 1-2 days.  Consultants PCCM Interventional radiology  Procedures  Bronchoscopy  Antibiotics   Anti-infectives (From admission, onward)   None      Subjective:   Yvonne Banks seen and examined today.  Continues to have some chest pain with cough and deep breathing, and on the right side.  Patient does complain of headache, states she has a history of migraines.  Denies current abdominal pain, nausea or vomiting, diarrhea or constipation, dizziness.  Objective:   Vitals:   12/04/17 1245 12/04/17 1250 12/04/17 1310 12/04/17 1426  BP: (!) 111/49 128/76 (!) 103/34 (!) 102/52  Pulse:    81  Resp: (!) _0 Temp:    98.2 F (36.8 C)  TempSrc:    Oral  SpO2: 95% 91% 92% 93%  Weight:      Height:        Intake/Output Summary (Last 24 hours) at 12/04/2017 1508 Last data filed at 12/04/2017 0500 Gross per 24 hour  Intake -  Output 1 ml  Net -1 ml   Filed Weights   12/03/17 2107 12/04/17 1142  Weight: 119.7 kg (263 lb 14.3 oz) 119.3 kg (263 lb)    Exam  General: Well  developed, well nourished, NAD, appears stated age  43: NCAT, mucous membranes moist.   Neck: Supple  Cardiovascular: S1 S2 auscultated, RRR, no murmur  Respiratory: Clear to auscultation bilaterally with equal chest rise  Abdomen: Soft, obese, nontender, nondistended, + bowel sounds  Extremities: warm dry without cyanosis clubbing or edema  Neuro: AAOx3, nonfocal  Psych: Normal affect and demeanor with intact judgement and insight   Data Reviewed: I have personally reviewed following labs and imaging  studies  CBC: Recent Labs  Lab 12/03/17 1202 12/04/17 0554  WBC 6.1 5.3  HGB 12.5 11.9*  HCT 38.5 36.9  MCV 85.9 86.0  PLT 297 993   Basic Metabolic Panel: Recent Labs  Lab 12/03/17 1202 12/04/17 0554  NA 140 139  K 4.1 3.9  CL 105 104  CO2 25 25  GLUCOSE 101* 102*  BUN 9 11  CREATININE 0.72 0.74  CALCIUM 9.2 8.9   GFR: Estimated Creatinine Clearance: 114.8 mL/min (by C-G formula based on SCr of 0.74 mg/dL). Liver Function Tests: No results for input(s): AST, ALT, ALKPHOS, BILITOT, PROT, ALBUMIN in the last 168 hours. No results for input(s): LIPASE, AMYLASE in the last 168 hours. No results for input(s): AMMONIA in the last 168 hours. Coagulation Profile: Recent Labs  Lab 12/04/17 0554  INR 0.97   Cardiac Enzymes: No results for input(s): CKTOTAL, CKMB, CKMBINDEX, TROPONINI in the last 168 hours. BNP (last 3 results) No results for input(s): PROBNP in the last 8760 hours. HbA1C: No results for input(s): HGBA1C in the last 72 hours. CBG: No results for input(s): GLUCAP in the last 168 hours. Lipid Profile: No results for input(s): CHOL, HDL, LDLCALC, TRIG, CHOLHDL, LDLDIRECT in the last 72 hours. Thyroid Function Tests: No results for input(s): TSH, T4TOTAL, FREET4, T3FREE, THYROIDAB in the last 72 hours. Anemia Panel: No results for input(s): VITAMINB12, FOLATE, FERRITIN, TIBC, IRON, RETICCTPCT in the last 72 hours. Urine analysis: No results found for: COLORURINE, APPEARANCEUR, LABSPEC, PHURINE, GLUCOSEU, HGBUR, BILIRUBINUR, KETONESUR, PROTEINUR, UROBILINOGEN, NITRITE, LEUKOCYTESUR Sepsis Labs: _0 (procalcitonin:4,lacticidven:4)  )No results found for this or any previous visit (from the past 240 hour(s)).    Radiology Studies: Dg Chest 2 View  Result Date: 12/03/2017 CLINICAL DATA:  Chest pain EXAM: CHEST - 2 VIEW COMPARISON:  12/11/2015 FINDINGS: Lingular linear densities, likely atelectasis. Concern for nodular areas in the right upper lobe  in the right suprahilar region and right apex. No effusions. Heart is normal size. No acute bony abnormality. IMPRESSION: Question right upper lobe nodules or early consolidation. This could be further evaluated with chest CT. Lingular atelectasis. Electronically Signed   By: Rolm Baptise M.D.   On: 12/03/2017 09:23   Ct Angio Chest Pe W And/or Wo Contrast  Result Date: 12/03/2017 CLINICAL DATA:  Pt complains of chest pain,cough and shortness of breath for the past week. Pain is worse with cough and movement. EXAM: CT ANGIOGRAPHY CHEST WITH CONTRAST TECHNIQUE: Multidetector CT imaging of the chest was performed using the standard protocol during bolus administration of intravenous contrast. Multiplanar CT image reconstructions and MIPs were obtained to evaluate the vascular anatomy. CONTRAST:  140m ISOVUE-370 IOPAMIDOL (ISOVUE-370) INJECTION 76% COMPARISON:  Chest x-ray 12/03/2017 FINDINGS: Cardiovascular: Heart size is normal. There is trace pericardial effusion. No significant coronary artery calcifications. Thoracic aorta is normal in appearance. Pulmonary arteries are unremarkable. Mediastinum/Nodes: Prevascular lymph node is 1.2 centimeters. RIGHT hilar lymph node is 1.9 centimeters. Precarinal lymph node is 0.9 centimeters. RIGHT paratracheal lymph node is 1.0 centimeters. Lungs/Pleura: There  are numerous masses primarily involving the RIGHT UPPER lobe but also a RIGHT LOWER lobe. These vary in size from 8 millimeters to 1.9 x 3.5 centimeters. There is perihilar peribronchial thickening particularly involving the RIGHT UPPER lobe bronchi. Airways are otherwise patent. Upper Abdomen: No acute abnormality. Musculoskeletal: No chest wall abnormality. No acute or significant osseous findings. Review of the MIP images confirms the above findings. IMPRESSION: 1. Technically adequate exam showing no acute pulmonary embolus. 2. Numerous RIGHT UPPER lobe masslike densities. Considerations include malignancy and  sarcoidosis. Infectious/inflammatory process could have a similar appearance. The asymmetric appearance and significant adenopathy favors malignancy over inflammatory causes. 3. Recommend consultation with pulmonology. 4. These results were called by telephone at the time of interpretation on 12/03/2017 at 6:22 pm to Dr. Davonna Belling , who verbally acknowledged these results. Electronically Signed   By: Nolon Nations M.D.   On: 12/03/2017 18:23   Dg Chest Port 1 View  Result Date: 12/04/2017 CLINICAL DATA:  Cough after bronchoscopy EXAM: PORTABLE CHEST 1 VIEW COMPARISON:  Chest x-ray and CT chest from yesterday. FINDINGS: The heart size and mediastinal contours are within normal limits. Normal pulmonary vascularity. Increased opacity in the right upper lobe. No pleural effusion or pneumothorax. No acute osseous abnormality. IMPRESSION: 1. Increased opacity in the right upper lobe likely reflects small alveolar hemorrhage status post biopsy. No pneumothorax. Electronically Signed   By: Titus Dubin M.D.   On: 12/04/2017 13:25   Dg C-arm Bronchoscopy  Result Date: 12/04/2017 C-ARM BRONCHOSCOPY: Fluoroscopy was utilized by the requesting physician.  No radiographic interpretation.     Scheduled Meds: . amitriptyline  25 mg Oral QHS  . butamben-tetracaine-benzocaine  1 spray Topical Once  . fluticasone  2 spray Each Nare Daily  . lidocaine  1 application Topical Once  . loratadine  10 mg Oral Daily  . montelukast  10 mg Oral QHS  . sertraline  200 mg Oral QHS   Continuous Infusions:   LOS: 0 days   Time Spent in minutes   35 minutes  Yvonne Banks D.O. on 12/04/2017 at 3:08 PM  Between 7am to 7pm - Pager - [phone]  After 7pm go to [email or web address] - password [email or web address]  And look for the night coverage person covering for me after hours  [facility] Office  [phone]

## 2017-12-04 NOTE — Op Note (Signed)
[facility] Cardiopulmonary Patient Name: Yvonne Banks Procedure Date: 12/04/2017 MRN: [id number] Attending MD: Chesley Mires , MD Date of Birth: January 25, 1971 CSN: [id number] Age: 47 Admit Type: Inpatient Ethnicity: Not Hispanic or Latino Procedure:            Bronchoscopy Indications:          Right upper lobe mass Providers:            Chesley Mires, MD, Cherre Huger RRT, RCP, Phillis Knack RRT,                        RCP Referring MD:          Medicines:            Fentanyl 150 mcg IV, Midazolam 7 mg IV, Lidocaine 2%                        applied to cords 6 mL Complications:        No immediate complications Estimated Blood Loss: Estimated blood loss was minimal. Procedure:      Pre-Anesthesia Assessment:      - A History and Physical has been performed. The patient's medications,       allergies and sensitivities have been reviewed.      - The risks and benefits of the procedure and the sedation options and       risks were discussed with the patient. All questions were answered and       informed consent was obtained.      After obtaining informed consent, the bronchoscope was passed under       direct vision. Throughout the procedure, the patient's blood pressure,       pulse, and oxygen saturations were monitored continuously. the [id number]       ([id number]) scope was introduced through the mouth and advanced to the       tracheobronchial tree of both lungs. The procedure was accomplished with       ease. The patient tolerated the procedure well. The total duration of       the procedure was 21 minutes. Total fluoroscopy time was 26 minutes. Findings:      The oropharynx appears normal. The larynx appears normal. The vocal       cords appear normal. The subglottic space is normal. The trachea is of       normal caliber. The carina is sharp. The tracheobronchial tree was       examined to at least the first subsegmental level. Bronchial mucosa was       friable with  bleeding with minor mucosal irritation; there are no       endobronchial lesions. Clear secretions.      Bronchoalveolar lavage was performed in the lung. 60 mL of fluid were       instilled. 15 mL cloudy, red fluid returned.      Transbronchial biopsies obtained from anterior segment right upper lobe       x 2 passes, and posterior segment right upper lobe x 2 passes using       fluorscopic guidance.      Estimated blood loss: minimal. Impression:      - Right upper lobe mass      - Bronchoalveolar lavage was performed.      - Transbronchial lung biopsies were performed. Moderate Sedation:      Moderate (conscious) sedation was personally administered  by the       endoscopist. The following parameters were monitored: oxygen saturation,       heart rate, blood pressure, and response to care. Total physician       intraservice time was 21 minutes. Recommendation:      - Await BAL, biopsy and culture results. Procedure Code(s):      --- Professional ---      [phone], Bronchoscopy, rigid or flexible, including fluoroscopic guidance,       when performed; with bronchial alveolar lavage      99152, Moderate sedation services provided by the same physician or       other qualified health care professional performing the diagnostic or       therapeutic service that the sedation supports, requiring the presence       of an independent trained observer to assist in the monitoring of the       patient's level of consciousness and physiological status; initial 15       minutes of intraservice time, patient age 49 years or older      31899, Unlisted procedure, trachea, bronchi Diagnosis Code(s):      --- Professional ---      R91.8, Other nonspecific abnormal finding of lung field CPT copyright 2017 [employer]. All rights reserved. The codes documented in this report are preliminary and upon coder review may  be revised to meet current compliance requirements. Chesley Mires,  MD Chesley Mires, MD 12/04/2017 12:56:24 PM This report has been signed electronically. Number of Addenda: 0 Scope In: 12:17:47 PM Scope Out: 12:33:37 PM

## 2017-12-04 NOTE — Progress Notes (Signed)
Patient ID: Yvonne Banks, female   DOB: 1971/07/17, 47 y.o.   MRN: [id number] Request received for consideration of image guided  lung biopsy in patient.  Imaging studies were reviewed by Dr. Kathlene Cote [phone]).  He recommends obtaining PET scan before considering a percutaneous needle biopsy of lung.  Patient currently being scheduled for bronchoscopy per pulmonology.

## 2017-12-04 NOTE — Progress Notes (Signed)
Video bronchoscopy performed.  Intervention bronchial biopsy. Intervention bronchial washing.  Patient tolerated procedure well.  Will continue to monitor.

## 2017-12-04 NOTE — Consult Note (Signed)
PULMONARY / CRITICAL CARE MEDICINE   Name: Yvonne Banks MRN: [id number] DOB: June 03, 1971    ADMISSION DATE:  12/03/2017 CONSULTATION DATE:  12/04/2017  REFERRING MD:  Dr. Ree Kida, [facility]  CHIEF COMPLAINT:  cough  HISTORY OF PRESENT ILLNESS:   47 yo female former smoker developed a cough with clear sputum about 1 week prior to admission.  She thought this was from allergies.  She then started getting discomfort in her right chest.  She wasn't having fever, chills, sweats, weight loss, hemoptysis.  She denies shortness of breath, nausea, vomiting, skin rash, or joint swelling.    [place] has been in [place] for years.  Travelled to [place] and [place] for work in 2013.  No other recent travel.  She has a Programmer, systems.  Works with an IT consultant.  Had pneumonia 2 yrs ago.  No history of asthma.  Quit smoking years ago.  CT chest showed mass like consolidation in Rt upper and lower lobes with mediastinal and hilar adenopathy.  Most recent chest xray otherwise from 2017 showed atelectasis.  PAST MEDICAL HISTORY :  She  has a past medical history of Allergy, Anemia (menometrorrhaghia), Anxiety, Bilateral knee pain, Complication of anesthesia, Fibroids, Gallstones, GERD (gastroesophageal reflux disease), Herpes genitalis in women, History of chlamydia, IBS (irritable bowel syndrome), Insomnia, Migraines, MRSA (methicillin resistant staph aureus) culture positive, PMS (premenstrual syndrome), Shortness of breath, Thyromegaly, and Vitamin D deficiency.  PAST SURGICAL HISTORY: She  has a past surgical history that includes Wisdom tooth extraction; Dilatation & currettage/hysteroscopy with resectoscope (N/A, 11/05/2012); skyla iud (01-14-13); Cholecystectomy (N/A, 05/19/2013); IUD removal; Bladder surgery; Dilation and curettage of uterus (11/2013); and Myomectomy (N/A, 02/17/2014).  Allergies  Allergen Reactions  . Adhesive [Tape] Other (See  Comments)    Causes skin discoloration.  . Erythromycin Nausea And Vomiting  . Iron     Stomach pain. Pt can take Integra for iron  . Latex Other (See Comments)    Discolors skin  . Other     Spicy peppers - anything hot or spicy causes swelling and skin inflammation    No current facility-administered medications on file prior to encounter.    Current Outpatient Medications on File Prior to Encounter  Medication Sig  . amitriptyline (ELAVIL) 25 MG tablet Take 25 mg by mouth at bedtime.  . cetirizine (ZYRTEC) 10 MG tablet Take 10 mg by mouth daily as needed for allergies.   . fluticasone (FLONASE) 50 MCG/ACT nasal spray Place 2 sprays into both nostrils daily.  . montelukast (SINGULAIR) 10 MG tablet Take 10 mg by mouth at bedtime.  . rizatriptan (MAXALT) 10 MG tablet Take 10 mg by mouth.  . sertraline (ZOLOFT) 100 MG tablet 200 mg at bedtime.     FAMILY HISTORY:  Her indicated that her mother is alive. She indicated that only one of her two sisters is alive. She indicated that her maternal grandmother is alive. She indicated that her paternal grandmother is alive. She indicated that the status of her paternal aunt is unknown. She indicated that her cousin is deceased.   SOCIAL HISTORY: She  reports that she quit smoking about 17 years ago. Her smoking use included cigarettes. She has a 15.00 pack-year smoking history. She has never used smokeless tobacco. She reports that she drinks alcohol. She reports that she does not use drugs.  REVIEW OF SYSTEMS:   C/o migraine headache.  Remainder of 12 point ROS negative except above.  SUBJECTIVE:  VITAL SIGNS: BP 138/80 (BP Location: Left Arm)   Pulse 85   Temp 98.4 F (36.9 C) (Oral)   Resp 17   Ht 5\' 5"  (1.651 m)   Wt 263 lb 14.3 oz (119.7 kg)   LMP 06/05/2017   SpO2 96%   BMI 43.91 kg/m   INTAKE / OUTPUT: I/O last 3 completed shifts: In: -  Out: 1 [Urine:1]  PHYSICAL EXAMINATION:  General - pleasant Eyes - pupils  reactive ENT - no sinus tenderness, no oral exudate, no LAN Cardiac - regular, no murmur Chest - no wheeze, rales Abd - soft, non tender Ext - no edema Skin - no rashes Neuro - normal strength Psych - normal mood  LABS:  BMET Recent Labs  Lab 12/03/17 1202 12/04/17 0554  NA 140 139  K 4.1 3.9  CL 105 104  CO2 25 25  BUN 9 11  CREATININE 0.72 0.74  GLUCOSE 101* 102*    Electrolytes Recent Labs  Lab 12/03/17 1202 12/04/17 0554  CALCIUM 9.2 8.9    CBC Recent Labs  Lab 12/03/17 1202 12/04/17 0554  WBC 6.1 5.3  HGB 12.5 11.9*  HCT 38.5 36.9  PLT 297 286    Coag's Recent Labs  Lab 12/04/17 0554  INR 0.97    Imaging Ct Angio Chest Pe W And/or Wo Contrast  Result Date: 12/03/2017 CLINICAL DATA:  Pt complains of chest pain,cough and shortness of breath for the past week. Pain is worse with cough and movement. EXAM: CT ANGIOGRAPHY CHEST WITH CONTRAST TECHNIQUE: Multidetector CT imaging of the chest was performed using the standard protocol during bolus administration of intravenous contrast. Multiplanar CT image reconstructions and MIPs were obtained to evaluate the vascular anatomy. CONTRAST:  184mL ISOVUE-370 IOPAMIDOL (ISOVUE-370) INJECTION 76% COMPARISON:  Chest x-ray 12/03/2017 FINDINGS: Cardiovascular: Heart size is normal. There is trace pericardial effusion. No significant coronary artery calcifications. Thoracic aorta is normal in appearance. Pulmonary arteries are unremarkable. Mediastinum/Nodes: Prevascular lymph node is 1.2 centimeters. RIGHT hilar lymph node is 1.9 centimeters. Precarinal lymph node is 0.9 centimeters. RIGHT paratracheal lymph node is 1.0 centimeters. Lungs/Pleura: There are numerous masses primarily involving the RIGHT UPPER lobe but also a RIGHT LOWER lobe. These vary in size from 8 millimeters to 1.9 x 3.5 centimeters. There is perihilar peribronchial thickening particularly involving the RIGHT UPPER lobe bronchi. Airways are otherwise  patent. Upper Abdomen: No acute abnormality. Musculoskeletal: No chest wall abnormality. No acute or significant osseous findings. Review of the MIP images confirms the above findings. IMPRESSION: 1. Technically adequate exam showing no acute pulmonary embolus. 2. Numerous RIGHT UPPER lobe masslike densities. Considerations include malignancy and sarcoidosis. Infectious/inflammatory process could have a similar appearance. The asymmetric appearance and significant adenopathy favors malignancy over inflammatory causes. 3. Recommend consultation with pulmonology. 4. These results were called by telephone at the time of interpretation on 12/03/2017 at 6:22 pm to Dr. Davonna Belling , who verbally acknowledged these results. Electronically Signed   By: Nolon Nations M.D.   On: 12/03/2017 18:23     STUDIES:  CT angio chest 5/16 >> borderline LAN, mass like consolidation in Rt upper and lower lobes  CULTURES:  ANTIBIOTICS:  SIGNIFICANT EVENTS: 5/16 Admit  DISCUSSION: 47 yo female former smoker with cough, right sided chest pain, and mass like consolidation in Rt upper > Rt lower lobe.  Concern is for atypical infection vs inflammatory process vs malignancy.  Will need to proceed with bronchoscopy.  ASSESSMENT / PLAN:  Rt upper >  Rt lower lung mass like consolidation. - bronchoscopy explained to pt; risks detailed as bleeding, infection, pneumothorax, and non diagnosis  Allergic rhinitis. - flonase, claritin, singulair  Migraine headaches. - elavil - prn analgesics  DVT prophylaxis - SCDs SUP - not indicated Nutrition - NPO Goals of care - full code  Chesley Mires, MD [place] 12/04/2017, 10:49 AM

## 2017-12-05 DIAGNOSIS — R918 Other nonspecific abnormal finding of lung field: Secondary | ICD-10-CM | POA: Diagnosis not present

## 2017-12-05 DIAGNOSIS — G43809 Other migraine, not intractable, without status migrainosus: Secondary | ICD-10-CM | POA: Diagnosis not present

## 2017-12-05 LAB — BASIC METABOLIC PANEL
ANION GAP: 9 (ref 5–15)
BUN: 15 mg/dL (ref 6–20)
CO2: 24 mmol/L (ref 22–32)
Calcium: 8.7 mg/dL — ABNORMAL LOW (ref 8.9–10.3)
Chloride: 106 mmol/L (ref 101–111)
Creatinine, Ser: 0.75 mg/dL (ref 0.44–1.00)
Glucose, Bld: 120 mg/dL — ABNORMAL HIGH (ref 65–99)
POTASSIUM: 3.7 mmol/L (ref 3.5–5.1)
SODIUM: 139 mmol/L (ref 135–145)

## 2017-12-05 LAB — CBC
HCT: 34.1 % — ABNORMAL LOW (ref 36.0–46.0)
Hemoglobin: 10.9 g/dL — ABNORMAL LOW (ref 12.0–15.0)
MCH: 27.7 pg (ref 26.0–34.0)
MCHC: 32 g/dL (ref 30.0–36.0)
MCV: 86.8 fL (ref 78.0–100.0)
PLATELETS: 277 10*3/uL (ref 150–400)
RBC: 3.93 MIL/uL (ref 3.87–5.11)
RDW: 14.6 % (ref 11.5–15.5)
WBC: 5.1 10*3/uL (ref 4.0–10.5)

## 2017-12-05 MED ORDER — BENZONATATE 200 MG PO CAPS: 200.0000 mg | ORAL_CAPSULE | Freq: Three times a day (TID) | ORAL | 0 refills | Status: DC | PRN

## 2017-12-05 MED ORDER — AMOXICILLIN-POT CLAVULANATE 250-125 MG PO TABS
1.0000 | ORAL_TABLET | Freq: Three times a day (TID) | ORAL | Status: DC
  Administered 2017-12-05: 1 via ORAL
  Filled 2017-12-05: qty 1

## 2017-12-05 MED ORDER — AMOXICILLIN-POT CLAVULANATE 250-125 MG PO TABS: 1.0000 | ORAL_TABLET | Freq: Three times a day (TID) | ORAL | 0 refills | Status: DC

## 2017-12-05 MED ORDER — BENZONATATE 100 MG PO CAPS: 200.0000 mg | ORAL_CAPSULE | Freq: Three times a day (TID) | ORAL | Status: DC | PRN

## 2017-12-05 MED ORDER — HYDROCODONE-HOMATROPINE 5-1.5 MG/5ML PO SYRP: 5.0000 mL | ORAL_SOLUTION | ORAL | 0 refills | Status: DC | PRN

## 2017-12-05 MED ORDER — HYDROCODONE-HOMATROPINE 5-1.5 MG/5ML PO SYRP: 5.0000 mL | ORAL_SOLUTION | ORAL | Status: DC | PRN

## 2017-12-05 MED ORDER — TRAMADOL HCL 50 MG PO TABS: 50.0000 mg | ORAL_TABLET | Freq: Four times a day (QID) | ORAL | 0 refills | Status: DC | PRN

## 2017-12-05 NOTE — Progress Notes (Signed)
Pt provided with discharge papers and note for work. Pt questions asked and answered. Denies further needs. NAD. Pt discharged to private vehicle via wheelchair. Pt's sister to drive home.

## 2017-12-05 NOTE — Discharge Summary (Signed)
Physician Discharge Summary  Yvonne Banks OFB:[id number] DOB: 1971-07-14 DOA: 12/03/2017  PCP: Jonathon Jordan, MD  Admit date: 12/03/2017 Discharge date: 12/05/2017  Time spent: 45 minutes  Recommendations for Outpatient Follow-up:  Patient will be discharged to home.  Patient will need to follow up with primary care provider within one week of discharge.  Follow up with pulmonology. Patient should continue medications as prescribed.  Patient should follow a regular diet.   Discharge Diagnoses:  Right lung mass Pleuritic chest pain Migraines Allergic rhinitis Depression Chronic normocytic anemia  Discharge Condition: Stable  Diet recommendation: regular  Filed Weights   12/03/17 2107 12/04/17 1142  Weight: 119.7 kg (263 lb 14.3 oz) 119.3 kg (263 lb)    History of present illness:  on 12/03/2017 by Dr. Karmen Bongo [name] a 47 y.o.femalewith medical history significant ofHSV, chlamydia, IBS,migraines, seasonal allergies,and GERD presenting with chest pain. Patient developed right-sided chest pain. It started as a little bit of discomfort with cough or movement. The pain increased and she noticed it with moving her arm, lying flat on her stomach, deep breathing. Today, it was excruciating. It has been there for a couple of weeks. She has had allergies and has had a light cough - she thought her muscles were sore from coughing but her cough improved and the pain worsened. The pain is particularly pleuritic. Nonproductive cough. She has some hot flashes and so she is unsure if she is having night sweats. She does not think she has had weight changes. Denies swollen glands. 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 Last foreign travel was in 2013 - she had shots before travel but denies having ever had TB. She did start amitriptyline a few weeks ago to prevent migraines. Interestingly, there is a note from  5/17 when she saw Dr. Melvyn Novas for refractory cough - thought to be most likely related to intercostal muscle strain/tear. It does not appear that she followed up and so perhaps this issue resolved completely in the interim.  Hospital Course:  Right lung mass -CTA chest: No acute PE.  Numerous right upper lobe masslike densities. -Pulmonology consulted and appreciated, s/p bronchoscopy, BAL and transbronchial lung biopsies performed -Interventional radiology consulted and appreciated however recommends obtaining a PET scan prior to percutaneous needle biopsy -continue nebs PRN, pain control -Pulmonology  Recommended starting augmentin  Pleuritic chest pain -suspect secondary to the above -continue supportive care and pain control -troponin negative  Migraines -Continue amitriptyline qHS -complains of mild headache  -will add on fioricet PRN  Allergic rhinitis -continue Claritin, Singulair, Flonase  Depression -Continue Zoloft  Chronic normocytic anemia -Hemoglobin currently 10.9, appears to be stable.  Consultants PCCM Interventional radiology  Procedures  Bronchoscopy  Discharge Exam: Vitals:   12/05/17 0507 12/05/17 0533  BP: (!) 102/50   Pulse: 88   Resp: 18   Temp: 98.1 F (36.7 C)   SpO2: 94% 92%   Continues to have cough which is causing chest pain.  Complains of sore throat.  Feels her headache has improved.  Denies current shortness of breath, abdominal pain, nausea or vomiting, diarrhea, dizziness.   General: Well developed, well nourished, NAD, appears stated age  HEENT: NCAT, mucous membranes moist.  Neck: Supple  Cardiovascular: S1 S2 auscultated, no rubs, murmurs or gallops. Regular rate and rhythm.  Respiratory: Clear to auscultation bilaterally with equal chest rise  Abdomen: Soft, obese, nontender, nondistended, + bowel sounds  Extremities: warm dry  without cyanosis clubbing or edema  Neuro: AAOx3, nonfocal  Psych: Normal affect and  demeanor with intact judgement and insight  Discharge Instructions Discharge Instructions    Discharge instructions   Complete by:  As directed    Patient will be discharged to home.  Patient will need to follow up with primary care provider within one week of discharge.  Follow up with pulmonology. Patient should continue medications as prescribed.  Patient should follow a regular diet.     Allergies as of 12/05/2017      Reactions   Adhesive [tape] Other (See Comments)   Causes skin discoloration.   Erythromycin Nausea And Vomiting   Iron    Stomach pain. Pt can take Integra for iron   Latex Other (See Comments)   Discolors skin   Other    Spicy peppers - anything hot or spicy causes swelling and skin inflammation      Medication List    TAKE these medications   amitriptyline 25 MG tablet Commonly known as:  ELAVIL Take 25 mg by mouth at bedtime.   amoxicillin-clavulanate 250-125 MG tablet Commonly known as:  AUGMENTIN Take 1 tablet by mouth every 8 (eight) hours.   benzonatate 200 MG capsule Commonly known as:  TESSALON Take 1 capsule (200 mg total) by mouth 3 (three) times daily as needed for cough (use if hycodan is not effective).   cetirizine 10 MG tablet Commonly known as:  ZYRTEC Take 10 mg by mouth daily as needed for allergies.   fluticasone 50 MCG/ACT nasal spray Commonly known as:  FLONASE Place 2 sprays into both nostrils daily.   HYDROcodone-homatropine 5-1.5 MG/5ML syrup Commonly known as:  HYCODAN Take 5 mLs by mouth every 4 (four) hours as needed for cough (use if guaiFENesin-codeine is not effective).   montelukast 10 MG tablet Commonly known as:  SINGULAIR Take 10 mg by mouth at bedtime.   rizatriptan 10 MG tablet Commonly known as:  MAXALT Take 10 mg by mouth.   sertraline 100 MG tablet Commonly known as:  ZOLOFT 200 mg at bedtime.   traMADol 50 MG tablet Commonly known as:  ULTRAM Take 1 tablet (50 mg total) by mouth every 6 (six)  hours as needed.      Allergies  Allergen Reactions  . Adhesive [Tape] Other (See Comments)    Causes skin discoloration.  . Erythromycin Nausea And Vomiting  . Iron     Stomach pain. Pt can take Integra for iron  . Latex Other (See Comments)    Discolors skin  . Other     Spicy peppers - anything hot or spicy causes swelling and skin inflammation   Follow-up Information    Jonathon Jordan, MD. Schedule an appointment as soon as possible for a visit in 1 week(s).   Specialty:  Family Medicine Why:  Hospital follow up Contact information: [place] [address] [phone]        Chesley Mires, MD. Schedule an appointment as soon as possible for a visit in 1 week(s).   Specialty:  Pulmonary Disease Why:  Hospital follow up Contact information: [address] [place] [address] [phone]            The results of significant diagnostics from this hospitalization (including imaging, microbiology, ancillary and laboratory) are listed below for reference.    Significant Diagnostic Studies: Dg Chest 2 View  Result Date: 12/03/2017 CLINICAL DATA:  Chest pain EXAM: CHEST - 2  VIEW COMPARISON:  12/11/2015 FINDINGS: Lingular linear densities, likely atelectasis. Concern for nodular areas in the right upper lobe in the right suprahilar region and right apex. No effusions. Heart is normal size. No acute bony abnormality. IMPRESSION: Question right upper lobe nodules or early consolidation. This could be further evaluated with chest CT. Lingular atelectasis. Electronically Signed   By: Rolm Baptise M.D.   On: 12/03/2017 09:23   Ct Angio Chest Pe W And/or Wo Contrast  Result Date: 12/03/2017 CLINICAL DATA:  Pt complains of chest pain,cough and shortness of breath for the past week. Pain is worse with cough and movement. EXAM: CT ANGIOGRAPHY CHEST WITH CONTRAST TECHNIQUE: Multidetector CT imaging of the chest was performed using the standard  protocol during bolus administration of intravenous contrast. Multiplanar CT image reconstructions and MIPs were obtained to evaluate the vascular anatomy. CONTRAST:  130m ISOVUE-370 IOPAMIDOL (ISOVUE-370) INJECTION 76% COMPARISON:  Chest x-ray 12/03/2017 FINDINGS: Cardiovascular: Heart size is normal. There is trace pericardial effusion. No significant coronary artery calcifications. Thoracic aorta is normal in appearance. Pulmonary arteries are unremarkable. Mediastinum/Nodes: Prevascular lymph node is 1.2 centimeters. RIGHT hilar lymph node is 1.9 centimeters. Precarinal lymph node is 0.9 centimeters. RIGHT paratracheal lymph node is 1.0 centimeters. Lungs/Pleura: There are numerous masses primarily involving the RIGHT UPPER lobe but also a RIGHT LOWER lobe. These vary in size from 8 millimeters to 1.9 x 3.5 centimeters. There is perihilar peribronchial thickening particularly involving the RIGHT UPPER lobe bronchi. Airways are otherwise patent. Upper Abdomen: No acute abnormality. Musculoskeletal: No chest wall abnormality. No acute or significant osseous findings. Review of the MIP images confirms the above findings. IMPRESSION: 1. Technically adequate exam showing no acute pulmonary embolus. 2. Numerous RIGHT UPPER lobe masslike densities. Considerations include malignancy and sarcoidosis. Infectious/inflammatory process could have a similar appearance. The asymmetric appearance and significant adenopathy favors malignancy over inflammatory causes. 3. Recommend consultation with pulmonology. 4. These results were called by telephone at the time of interpretation on 12/03/2017 at 6:22 pm to Dr. NDavonna Belling, who verbally acknowledged these results. Electronically Signed   By: ENolon NationsM.D.   On: 12/03/2017 18:23   Dg Chest Port 1 View  Result Date: 12/04/2017 CLINICAL DATA:  Cough after bronchoscopy EXAM: PORTABLE CHEST 1 VIEW COMPARISON:  Chest x-ray and CT chest from yesterday. FINDINGS: The  heart size and mediastinal contours are within normal limits. Normal pulmonary vascularity. Increased opacity in the right upper lobe. No pleural effusion or pneumothorax. No acute osseous abnormality. IMPRESSION: 1. Increased opacity in the right upper lobe likely reflects small alveolar hemorrhage status post biopsy. No pneumothorax. Electronically Signed   By: WTitus DubinM.D.   On: 12/04/2017 13:25   Dg C-arm Bronchoscopy  Result Date: 12/04/2017 C-ARM BRONCHOSCOPY: Fluoroscopy was utilized by the requesting physician.  No radiographic interpretation.    Microbiology: Recent Results (from the past 240 hour(s))  Culture, bal-quantitative     Status: None (Preliminary result)   Collection Time: 12/04/17 12:30 PM  Result Value Ref Range Status   Specimen Description   Final    BRONCHIAL ALVEOLAR LAVAGE Performed at [id number] [place]  [id number]   Special Requests   Final    Normal Performed at [facility] [id number], [id number]   Gram Stain   Final    MODERATE WBC PRESENT,BOTH PMN AND MONONUCLEAR NO ORGANISMS SEEN    Culture   Final  TOO YOUNG TO READ Performed at [facility], [address], [place],  [address]    Report Status PENDING  Incomplete     Labs: Basic Metabolic Panel: Recent Labs  Lab 12/03/17 1202 12/04/17 0554 12/05/17 0517  NA 140 139 139  K 4.1 3.9 3.7  CL 105 104 106  CO2 _0 GLUCOSE 101* 102* 120*  BUN _1 CREATININE 0.72 0.74 0.75  CALCIUM 9.2 8.9 8.7*   Liver Function Tests: No results for input(s): AST, ALT, ALKPHOS, BILITOT, PROT, ALBUMIN in the last 168 hours. No results for input(s): LIPASE, AMYLASE in the last 168 hours. No results for input(s): AMMONIA in the last 168 hours. CBC: Recent Labs  Lab 12/03/17 1202 12/04/17 0554 12/05/17 0517  WBC 6.1 5.3 5.1  HGB 12.5 11.9* 10.9*  HCT 38.5 36.9 34.1*  MCV 85.9 86.0  86.8  PLT 297 286 277   Cardiac Enzymes: No results for input(s): CKTOTAL, CKMB, CKMBINDEX, TROPONINI in the last 168 hours. BNP: BNP (last 3 results) No results for input(s): BNP in the last 8760 hours.  ProBNP (last 3 results) No results for input(s): PROBNP in the last 8760 hours.  CBG: No results for input(s): GLUCAP in the last 168 hours.     Signed:  Cristal Ford  [facility] 12/05/2017, 5:00 PM

## 2017-12-05 NOTE — Progress Notes (Signed)
PULMONARY / CRITICAL CARE MEDICINE   Name: Yvonne Banks MRN: [id number] DOB: [birth year]    ADMISSION DATE:  12/03/2017 CONSULTATION DATE:  12/04/2017  REFERRING MD:  Dr. Ree Kida, [facility]  CHIEF COMPLAINT:  cough  HISTORY OF PRESENT ILLNESS:   47 yo female former smoker developed a cough with clear sputum about 1 week prior to admission.  She thought this was from allergies.  She then started getting discomfort in her right chest.  She wasn't having fever, chills, sweats, weight loss, hemoptysis.  She denies shortness of breath, nausea, vomiting, skin rash, or joint swelling.    [place] but has been in [place] for years.  Travelled to [place] and [place] for work in 2013.  No other recent travel.  She has a Programmer, systems.  Works with an IT consultant.  Had pneumonia 2 yrs ago.  No history of asthma.  Quit smoking years ago.  CT chest showed mass like consolidation in Rt upper and lower lobes with mediastinal and hilar adenopathy.  Most recent chest xray otherwise from 2017 showed atelectasis.  SUBJECTIVE:  Intermittent wheezing.  VITAL SIGNS: BP (!) 102/50 (BP Location: Left Arm)   Pulse 88   Temp 98.1 F (36.7 C) (Oral)   Resp 18   Ht _0  (1.651 m)   Wt 263 lb (119.3 kg)   LMP 06/05/2017   SpO2 92%   BMI 43.77 kg/m   INTAKE / OUTPUT: I/O last 3 completed shifts: In: -  Out: 1 [Urine:1]  PHYSICAL EXAMINATION:  General - pleasant Eyes - pupils reactive ENT - no sinus tenderness, no oral exudate, no LAN Cardiac - regular, no murmur Chest - no wheeze, rales Abd - soft, non tender Ext - no edema Skin - no rashes Neuro - normal strength Psych - normal mood   LABS:  BMET Recent Labs  Lab 12/03/17 1202 12/04/17 0554 12/05/17 0517  NA 140 139 139  K 4.1 3.9 3.7  CL 105 104 106  CO2 _1 BUN _2 CREATININE 0.72 0.74 0.75  GLUCOSE 101* 102* 120*    Electrolytes Recent Labs  Lab 12/03/17 1202  12/04/17 0554 12/05/17 0517  CALCIUM 9.2 8.9 8.7*    CBC Recent Labs  Lab 12/03/17 1202 12/04/17 0554 12/05/17 0517  WBC 6.1 5.3 5.1  HGB 12.5 11.9* 10.9*  HCT 38.5 36.9 34.1*  PLT 297 286 277    Coag's Recent Labs  Lab 12/04/17 0554  INR 0.97    Imaging Dg Chest Port 1 View  Result Date: 12/04/2017 CLINICAL DATA:  Cough after bronchoscopy EXAM: PORTABLE CHEST 1 VIEW COMPARISON:  Chest x-ray and CT chest from yesterday. FINDINGS: The heart size and mediastinal contours are within normal limits. Normal pulmonary vascularity. Increased opacity in the right upper lobe. No pleural effusion or pneumothorax. No acute osseous abnormality. IMPRESSION: 1. Increased opacity in the right upper lobe likely reflects small alveolar hemorrhage status post biopsy. No pneumothorax. Electronically Signed   By: Titus Dubin M.D.   On: 12/04/2017 13:25   Dg C-arm Bronchoscopy  Result Date: 12/04/2017 C-ARM BRONCHOSCOPY: Fluoroscopy was utilized by the requesting physician.  No radiographic interpretation.     STUDIES:  CT angio chest 5/16 >> borderline LAN, mass like consolidation in Rt upper and lower lobes Bronchoscopy 5/17 >>  CULTURES: BAL 5/17 >> AFB 5/17 >> Fungal 5/17 >>  ANTIBIOTICS: Augmentin 5/18 >>   DISCUSSION: 47 yo female former smoker  with cough, right sided chest pain, and mass like consolidation in Rt upper > Rt lower lobe.  Concern is for atypical infection vs inflammatory process vs malignancy.  Preliminary bronch results suggestive of acute inflammatory/infectious process.  ASSESSMENT / PLAN:  Rt upper > Rt lower lung mass like consolidation. - start augmentin - okay for d/c home 5/18 - will have office call her on 5/20 and arrange for pulmonary office follow up next week  Allergic rhinitis. - flonase, claritin, singulair  Migraine headaches. - elavil - prn analgesics  Chesley Mires, MD [place] 12/05/2017, 12:12 PM

## 2017-12-05 NOTE — Discharge Instructions (Signed)
Chest Wall Pain °Chest wall pain is pain in or around the bones and muscles of your chest. Sometimes, an injury causes this pain. Sometimes, the cause may not be known. This pain may take several weeks or longer to get better. °Follow these instructions at home: °Pay attention to any changes in your symptoms. Take these actions to help with your pain: °· Rest as told by your health care provider. °· Avoid activities that cause pain. These include any activities that use your chest muscles or your abdominal and side muscles to lift heavy items. °· If directed, apply ice to the painful area: °? Put ice in a plastic bag. °? Place a towel between your skin and the bag. °? Leave the ice on for 20 minutes, 2-3 times per day. °· Take over-the-counter and prescription medicines only as told by your health care provider. °· Do not use tobacco products, including cigarettes, chewing tobacco, and e-cigarettes. If you need help quitting, ask your health care provider. °· Keep all follow-up visits as told by your health care provider. This is important. ° °Contact a health care provider if: °· You have a fever. °· Your chest pain becomes worse. °· You have new symptoms. °Get help right away if: °· You have nausea or vomiting. °· You feel sweaty or light-headed. °· You have a cough with phlegm (sputum) or you cough up blood. °· You develop shortness of breath. °This information is not intended to replace advice given to you by your health care provider. Make sure you discuss any questions you have with your health care provider. °Document Released: 07/07/2005 Document Revised: 11/15/2015 Document Reviewed: 10/02/2014 °Elsevier Interactive Patient Education © 2018 Elsevier Inc. ° °

## 2017-12-05 NOTE — Plan of Care (Signed)
Pt states she feels improved. Pt states pain and cough improving. Lab work stable at this time.

## 2017-12-06 ENCOUNTER — Telehealth: Payer: Self-pay | Admitting: Internal Medicine

## 2017-12-06 NOTE — Telephone Encounter (Signed)
error 

## 2017-12-07 ENCOUNTER — Telehealth: Payer: Self-pay | Admitting: Internal Medicine

## 2017-12-07 ENCOUNTER — Encounter (HOSPITAL_COMMUNITY): Payer: Self-pay | Admitting: Pulmonary Disease

## 2017-12-07 LAB — CULTURE, BAL-QUANTITATIVE W GRAM STAIN
Culture: 10000 — AB
Special Requests: NORMAL

## 2017-12-07 NOTE — Telephone Encounter (Signed)
Called and spoke with pt and stated to her at her HFU appt with Wyn Quaker, NP he will go over the results of the bronchoscopy at that visit with her.  Pt expressed understanding. Nothing further needed at this time.

## 2017-12-08 ENCOUNTER — Ambulatory Visit (INDEPENDENT_AMBULATORY_CARE_PROVIDER_SITE_OTHER): Payer: 59 | Admitting: Pulmonary Disease

## 2017-12-08 ENCOUNTER — Other Ambulatory Visit: Payer: 59

## 2017-12-08 ENCOUNTER — Ambulatory Visit (INDEPENDENT_AMBULATORY_CARE_PROVIDER_SITE_OTHER): Payer: 59 | Admitting: *Deleted

## 2017-12-08 ENCOUNTER — Encounter: Payer: Self-pay | Admitting: Pulmonary Disease

## 2017-12-08 VITALS — BP 130/70 | HR 96 | Ht 65.0 in | Wt 267.2 lb

## 2017-12-08 DIAGNOSIS — R05 Cough: Secondary | ICD-10-CM

## 2017-12-08 DIAGNOSIS — R059 Cough, unspecified: Secondary | ICD-10-CM

## 2017-12-08 DIAGNOSIS — R058 Other specified cough: Secondary | ICD-10-CM

## 2017-12-08 DIAGNOSIS — R0602 Shortness of breath: Secondary | ICD-10-CM

## 2017-12-08 DIAGNOSIS — R918 Other nonspecific abnormal finding of lung field: Secondary | ICD-10-CM

## 2017-12-08 DIAGNOSIS — R0789 Other chest pain: Secondary | ICD-10-CM | POA: Diagnosis not present

## 2017-12-08 NOTE — Patient Instructions (Addendum)
Spirometry in office today 6-minute walk in office today  Lab work >>>QuantiFERON gold  >>>ACE level    >>>we will call you with these results   Follow-up Dr. Halford Chessman in 4 to 6 weeks.  Complete pulmonary function test prior to this appointment.   Please contact the office if your symptoms worsen or you have concerns that you are not improving.    Thank you for choosing [facility] for your healthcare, and for allowing [place] to partner with you on your healthcare journey. I am thankful to be able to provide care to you today.   Wyn Quaker FNP-C

## 2017-12-08 NOTE — Assessment & Plan Note (Signed)
Continue pain medications as prescribed Can also do 600 of ibuprofen every 6 hours or 800 mg of ibuprofen every 8 hours as needed for pain

## 2017-12-08 NOTE — Assessment & Plan Note (Signed)
Reviewed bronc results and path report Discussed CT results from the hospital Spirometry in office today 6-minute walk in office today Pulmonary function test prior to next appointment with Dr. Halford Chessman Follow-up with Dr. Halford Chessman in 4 to 6 weeks

## 2017-12-08 NOTE — Progress Notes (Signed)
SIX MIN WALK 12/08/2017  Medications Flonase 81mcg & Ibuprofen 600mg  both taken at 9:00a  Supplimental Oxygen during Test? (L/min) No  Laps 7  Partial Lap (in Meters) 18  Baseline BP (sitting) 128/76  Baseline Heartrate 81  Baseline Dyspnea (Borg Scale) 3  Baseline Fatigue (Borg Scale) 3  Baseline SPO2 95  BP (sitting) 160/90  Heartrate 132  Dyspnea (Borg Scale) 5  Fatigue (Borg Scale) 4  SPO2 96  BP (sitting) 148/86  Heartrate 105  SPO2 96  Stopped or Paused before Six Minutes No  Distance Completed 354  Tech Comments: pt completed test at moderate pace with c/o calf pain at the end of test but stated this is normal with this amount of exertion. steady gait with no desats.

## 2017-12-08 NOTE — Progress Notes (Signed)
@Patient  ID: Yvonne Banks, female    DOB: June 16, 1971, 47 y.o.   MRN: [id number]  Chief Complaint  Patient presents with  . Hospitalization Follow-up    follow up bronch results / HFU     Referring provider: Jonathon Jordan, MD  HPI: 47 year old female patient former smoker (quit smoking 2003) with history of recurrent sinus problems, GERD, cough initially and last seen on 12/11/2015 for initial consult with Dr. Melvyn Novas.  Encounters with Pulmonary:  12/03/2017-hospitalization- right lung mass, pleuritic chest pain >>>CTA of chest no acute PE, numerous right upper lobe masslike densities >>>bronchoscopy >>>Interventional radiology recommends obtaining PET scan prior to needle biopsy >>>Augmentin started by pulmonology   12/11/2015-initial office visit- [name] Upper airway cough syndrome treated for GERD Extensive discussion regarding how to manage and treat  Tests:  12/04/2017-bronchoscopy  >>>surgical path shows no evidence of malignancy  >>>vague granulomas 12/04/2017-chest x-ray- increased opacity in the right upper lobe likely reflects small alveolar hemorrhage status post biopsy 12/03/2017 CT angios chest with contrast- numerous right upper lobe mass densities no acute pulmonary embolus, considerations include malignancy and sarcoidosis 12/03/2017-chest x-ray- right upper lobe nodules questionable recommend chest CT  Micro 5/17- culture - Moderate WBC present 5/17 - Fungal culture 5/17 - AFB   Labs  12/03/17 - HIV non reactive, normal BMP, slight anemia hemoglobin 11.9, PT/INR normal  12/08/17 Hospital Follow Up  Patient reports to office today for hospital follow-up following bronchoscopy on 12/04/2017.  Patient presented to hospital on 5/16 had a CT showing no acute PE but right hilar adenopathy as well as right mass.  Patient reports feeling okay since leaving the hospital.  Still having pleuritic chest pain and shortness of breath.  Patient also reporting that she is  having a wheeze at times.  Patient states they are still taking their Augmentin currently on a 10-day prescription.     Allergies  Allergen Reactions  . Adhesive [Tape] Other (See Comments)    Causes skin discoloration.  . Erythromycin Nausea And Vomiting  . Iron     Stomach pain. Pt can take Integra for iron  . Latex Other (See Comments)    Discolors skin  . Other     Spicy peppers - anything hot or spicy causes swelling and skin inflammation     There is no immunization history on file for this patient.  Past Medical History:  Diagnosis Date  . Allergy   . Anemia menometrorrhaghia   history  . Anxiety    no meds  . Bilateral knee pain   . Complication of anesthesia    woke up coughing after surgery May 2014 - required inhaler in recovery and for a week after that surgery--pt thinks it was Symbicort  . Fibroids   . Gallstones    causing nausea and and midchest -abdominal pain  . GERD (gastroesophageal reflux disease)    History - resolved treatment with diet - no meds  . Herpes genitalis in women   . History of chlamydia   . IBS (irritable bowel syndrome)   . Insomnia   . Migraines    topamax  . MRSA (methicillin resistant staph aureus) culture positive    skin infection7/2013   PT STATES NO INFECTION AT PRESENT AND SHE IS FOLLOWING HER DOCTOR'S ORDERS TO USE MUPIROCIN  OINTMENT BID IN NOSE FIRST 5 DAYS OF EACH MONTH THRU DEC 2014 AND HAS HIBICLENS TO [place] THE AREAS WHERE THE INFECTION HAS BEEN.  . PMS (premenstrual syndrome)   .  Shortness of breath    with exertion  - pt feels weight related  . Thyromegaly   . Vitamin D deficiency     Tobacco History: Social History   Tobacco Use  Smoking Status Former Smoker  . Packs/day: 1.00  . Years: 15.00  . Pack years: 15.00  . Types: Cigarettes  . Last attempt to quit: 07/21/2000  . Years since quitting: 17.3  Smokeless Tobacco Never Used   Counseling given: Not Answered   Outpatient Encounter Medications as of  12/08/2017  Medication Sig  . amitriptyline (ELAVIL) 25 MG tablet Take 25 mg by mouth at bedtime.  Marland Kitchen amoxicillin-clavulanate (AUGMENTIN) 250-125 MG tablet Take 1 tablet by mouth every 8 (eight) hours.  . benzonatate (TESSALON) 200 MG capsule Take 1 capsule (200 mg total) by mouth 3 (three) times daily as needed for cough (use if hycodan is not effective).  . cetirizine (ZYRTEC) 10 MG tablet Take 10 mg by mouth daily as needed for allergies.   . fluticasone (FLONASE) 50 MCG/ACT nasal spray Place 2 sprays into both nostrils daily.  Marland Kitchen HYDROcodone-homatropine (HYCODAN) 5-1.5 MG/5ML syrup Take 5 mLs by mouth every 4 (four) hours as needed for cough (use if guaiFENesin-codeine is not effective).  . montelukast (SINGULAIR) 10 MG tablet Take 10 mg by mouth at bedtime.  . rizatriptan (MAXALT) 10 MG tablet Take 10 mg by mouth.  . sertraline (ZOLOFT) 100 MG tablet 200 mg at bedtime.   . traMADol (ULTRAM) 50 MG tablet Take 1 tablet (50 mg total) by mouth every 6 (six) hours as needed.   No facility-administered encounter medications on file as of 12/08/2017.      Review of Systems  Constitutional:   No  weight loss, night sweats,  Fevers, chills, fatigue, or  lassitude.  HEENT:   No headaches,  Difficulty swallowing,  Tooth/dental problems, or  Sore throat,  No sneezing, itching, ear ache, nasal congestion, post nasal drip,   CV: +chest pain, No Orthopnea, PND, swelling in lower extremities, anasarca, dizziness, palpitations, syncope.   GI  No heartburn, indigestion, abdominal pain, nausea, vomiting, diarrhea, change in bowel habits, loss of appetite, bloody stools.   Resp: +shortness of breath with exertion, occasionally at rest, cough, wheeze with exertion  No excess mucus,  No coughing up of blood.  No change in color of mucus.    No chest wall deformity  Skin: no rash or lesions.  GU: no dysuria, change in color of urine, no urgency or frequency.  No flank pain, no hematuria   MS:  +chest  pain,  No joint pain or swelling.  No decreased range of motion.  No back pain.    Physical Exam  BP 130/70 (BP Location: Left Arm, Cuff Size: Normal)   Pulse 96   Ht 5\' 5"  (1.651 m)   Wt 267 lb 3.2 oz (121.2 kg)   SpO2 95%   BMI 44.46 kg/m   GEN: A/Ox3; pleasant, NAD, well nourished    HEENT:  [place]/AT,  EACs-clear, TMs-wnl, NOSE-clear, THROAT-clear, no lesions, no postnasal drip or exudate noted.   NECK:  Supple w/ fair ROM; no JVD; normal carotid impulses w/o bruits; no thyromegaly or nodules palpated; no lymphadenopathy.    RESP : right UL faint crackles, diminished in bases  no accessory muscle use, no dullness to percussion  CARD:  RRR, no m/r/g, no peripheral edema, pulses intact, no cyanosis or clubbing.  GI:   Soft & nt; nml bowel sounds; no organomegaly or masses  detected.   Musco: Warm bil, no deformities or joint swelling noted.   Neuro: alert, no focal deficits noted.    Skin: Warm, no lesions or rashes  Office Spirometry Results: moderately severe restriction.  >>>will do PFT at next appointment     SIX MIN WALK 12/08/2017  Medications Flonase 74mcg & Ibuprofen 600mg  both taken at 9:00a  Supplimental Oxygen during Test? (L/min) No  Laps 7  Partial Lap (in Meters) 18  Baseline BP (sitting) 128/76  Baseline Heartrate 81  Baseline Dyspnea (Borg Scale) 3  Baseline Fatigue (Borg Scale) 3  Baseline SPO2 95  BP (sitting) 160/90  Heartrate 132  Dyspnea (Borg Scale) 5  Fatigue (Borg Scale) 4  SPO2 94  BP (sitting) 148/86  Heartrate 105  SPO2 96  Stopped or Paused before Six Minutes No  Distance Completed 354  Tech Comments: pt completed test at moderate pace with c/o calf pain at the end of test but stated this is normal with this amount of exertion. steady gait with no desats.      Lab Results:  CBC    Component Value Date/Time   WBC 5.1 12/05/2017 0517   RBC 3.93 12/05/2017 0517   HGB 10.9 (L) 12/05/2017 0517   HCT 34.1 (L) 12/05/2017 0517   PLT  277 12/05/2017 0517   MCV 86.8 12/05/2017 0517   MCH 27.7 12/05/2017 0517   MCHC 32.0 12/05/2017 0517   RDW 14.6 12/05/2017 0517   LYMPHSABS 3.4 05/21/2013 0541   MONOABS 0.4 05/21/2013 0541   EOSABS 0.1 05/21/2013 0541   BASOSABS 0.0 05/21/2013 0541    BMET    Component Value Date/Time   NA 139 12/05/2017 0517   K 3.7 12/05/2017 0517   CL 106 12/05/2017 0517   CO2 24 12/05/2017 0517   GLUCOSE 120 (H) 12/05/2017 0517   BUN 15 12/05/2017 0517   CREATININE 0.75 12/05/2017 0517   CALCIUM 8.7 (L) 12/05/2017 0517   GFRNONAA >60 12/05/2017 0517   GFRAA >60 12/05/2017 0517    BNP No results found for: BNP  ProBNP No results found for: PROBNP  Imaging: Dg Chest 2 View  Result Date: 12/03/2017 CLINICAL DATA:  Chest pain EXAM: CHEST - 2 VIEW COMPARISON:  12/11/2015 FINDINGS: Lingular linear densities, likely atelectasis. Concern for nodular areas in the right upper lobe in the right suprahilar region and right apex. No effusions. Heart is normal size. No acute bony abnormality. IMPRESSION: Question right upper lobe nodules or early consolidation. This could be further evaluated with chest CT. Lingular atelectasis. Electronically Signed   By: Rolm Baptise M.D.   On: 12/03/2017 09:23   Ct Angio Chest Pe W And/or Wo Contrast  Result Date: 12/03/2017 CLINICAL DATA:  Pt complains of chest pain,cough and shortness of breath for the past week. Pain is worse with cough and movement. EXAM: CT ANGIOGRAPHY CHEST WITH CONTRAST TECHNIQUE: Multidetector CT imaging of the chest was performed using the standard protocol during bolus administration of intravenous contrast. Multiplanar CT image reconstructions and MIPs were obtained to evaluate the vascular anatomy. CONTRAST:  171mL ISOVUE-370 IOPAMIDOL (ISOVUE-370) INJECTION 76% COMPARISON:  Chest x-ray 12/03/2017 FINDINGS: Cardiovascular: Heart size is normal. There is trace pericardial effusion. No significant coronary artery calcifications. Thoracic  aorta is normal in appearance. Pulmonary arteries are unremarkable. Mediastinum/Nodes: Prevascular lymph node is 1.2 centimeters. RIGHT hilar lymph node is 1.9 centimeters. Precarinal lymph node is 0.9 centimeters. RIGHT paratracheal lymph node is 1.0 centimeters. Lungs/Pleura: There are numerous masses primarily involving  the RIGHT UPPER lobe but also a RIGHT LOWER lobe. These vary in size from 8 millimeters to 1.9 x 3.5 centimeters. There is perihilar peribronchial thickening particularly involving the RIGHT UPPER lobe bronchi. Airways are otherwise patent. Upper Abdomen: No acute abnormality. Musculoskeletal: No chest wall abnormality. No acute or significant osseous findings. Review of the MIP images confirms the above findings. IMPRESSION: 1. Technically adequate exam showing no acute pulmonary embolus. 2. Numerous RIGHT UPPER lobe masslike densities. Considerations include malignancy and sarcoidosis. Infectious/inflammatory process could have a similar appearance. The asymmetric appearance and significant adenopathy favors malignancy over inflammatory causes. 3. Recommend consultation with pulmonology. 4. These results were called by telephone at the time of interpretation on 12/03/2017 at 6:22 pm to Dr. Davonna Belling , who verbally acknowledged these results. Electronically Signed   By: Nolon Nations M.D.   On: 12/03/2017 18:23   Dg Chest Port 1 View  Result Date: 12/04/2017 CLINICAL DATA:  Cough after bronchoscopy EXAM: PORTABLE CHEST 1 VIEW COMPARISON:  Chest x-ray and CT chest from yesterday. FINDINGS: The heart size and mediastinal contours are within normal limits. Normal pulmonary vascularity. Increased opacity in the right upper lobe. No pleural effusion or pneumothorax. No acute osseous abnormality. IMPRESSION: 1. Increased opacity in the right upper lobe likely reflects small alveolar hemorrhage status post biopsy. No pneumothorax. Electronically Signed   By: Titus Dubin M.D.   On:  12/04/2017 13:25   Dg C-arm Bronchoscopy  Result Date: 12/04/2017 C-ARM BRONCHOSCOPY: Fluoroscopy was utilized by the requesting physician.  No radiographic interpretation.     Assessment & Plan:   Pleasant 47 year old female presents the office today with hospital follow-up.   Work-up for potential sarcoidosis.  We will do additional lab work today as well as confirm that she does not have tuberculosis.  After speaking with Dr. Halford Chessman, we will start patient on 40 mg of prednisone once the quant gold result is back.  Patient understands and has been educated that we will follow-up with her on these results and also place this prescription.  Additional work-up done today in office is: 6 Minute walk and in office spirometry.  In office spirometry report shows moderately severe restriction.  Patient will also get pulmonary function tests done prior to seeing Dr. Halford Chessman in 4 to 6 weeks.   Lung mass Reviewed bronc results and path report Discussed CT results from the hospital We will do lab work today-ACE level as well as QuantiFERON gold Spirometry in office today 6-minute walk in office today Pulmonary function test prior to next appointment with Dr. Halford Chessman Follow-up with Dr. Halford Chessman in 4 to 6 weeks   Cough Reviewed bronc results and path report Discussed CT results from the hospital Spirometry in office today 6-minute walk in office today Pulmonary function test prior to next appointment with Dr. Halford Chessman Follow-up with Dr. Halford Chessman in 4 to 6 weeks  Shortness of breath Reviewed bronc results and path report Discussed CT results from the hospital We will do lab work today-ACE level as well as QuantiFERON gold Spirometry in office today 6-minute walk in office today Pulmonary function test prior to next appointment with Dr. said Follow-up with Dr. said in 4 to 6 weeks  Musculoskeletal chest pain Continue pain medications as prescribed Can also do 600 of ibuprofen every 6 hours or 800 mg of  ibuprofen every 8 hours as needed for pain     Lauraine Rinne, NP 12/08/2017

## 2017-12-08 NOTE — Assessment & Plan Note (Addendum)
Reviewed bronc results and path report Discussed CT results from the hospital We will do lab work today-ACE level as well as QuantiFERON gold Spirometry in office today 6-minute walk in office today Pulmonary function test prior to next appointment with Dr. Halford Chessman Follow-up with Dr. Halford Chessman in 4 to 6 weeks

## 2017-12-08 NOTE — Assessment & Plan Note (Signed)
Reviewed bronc results and path report Discussed CT results from the hospital We will do lab work today-ACE level as well as QuantiFERON gold Spirometry in office today 6-minute walk in office today Pulmonary function test prior to next appointment with Dr. said Follow-up with Dr. said in 4 to 6 weeks

## 2017-12-08 NOTE — Progress Notes (Signed)
Reviewed results of in office spirometry with patient at office visit.  Also discussed upcoming pulmonary function test.  As well as 6-minute walk.  No further follow-up is needed at this time.  Wyn Quaker FNP-C

## 2017-12-09 LAB — ACID FAST SMEAR (AFB, MYCOBACTERIA)

## 2017-12-09 LAB — ACID FAST SMEAR (AFB)

## 2017-12-09 LAB — ANGIOTENSIN CONVERTING ENZYME: ANGIOTENSIN-CONVERTING ENZYME: 84 U/L — AB (ref 9–67)

## 2017-12-10 ENCOUNTER — Telehealth: Payer: Self-pay | Admitting: Pulmonary Disease

## 2017-12-10 NOTE — Progress Notes (Signed)
Your angiotensin-converting enzyme results of come back elevated to 84.  This is a marker for sarcoidosis.  As we discussed in your office appointment.  We are still waiting on the results of the QuantiFERON gold.  Which will show [place] if you have tuberculosis.  Once we have that result back then we can start the prednisone.  Wyn Quaker FNP-C

## 2017-12-10 NOTE — Telephone Encounter (Addendum)
Spoke with pt, I advised her that the labs are not back yet. She states Yvonne Banks told her that it would be back by now. [facility] stated this test usually takes 4-5 days to come back. The pt stated she will need Yvonne Banks to give her more days out of work if this is the case. Yvonne Banks please advise.      [facility] [phone]  [facility] [id number]

## 2017-12-10 NOTE — Telephone Encounter (Signed)
Ok I will let her know. What date can we put on her her FMLA forms? Her job needs a revised letter so she can be out of work until the results come back.

## 2017-12-10 NOTE — Telephone Encounter (Signed)
Jonelle Sidle thanks for following up.  I am sorry for the confusion let me clarify.  Her Angiotensin converting enzyme levels are back that is showing elevation this is sometimes seen as a marker in sarcoidosis as we discussed at her appointment.  Her quantiferon gold lab test has not resulted yet.  This test will let us know if she potentially has tuberculosis.  Once we have confirmation from this test that she is negative and does not have tuberculosis.  We can start steroid treatment as discussed at her appointment.  If she has any other concerns or questions be more than happy to try to help answer them.I appreciate your patient's understanding.  It is a pleasure taking care of you.Wyn Quaker FNP-C

## 2017-12-10 NOTE — Telephone Encounter (Signed)
I do not have her FMLA forms. She would need to send those to [employer] [facility] [employer] In the [facility] building.   If she is meaning a work note then we can do the date of hospital admission till next Wednesday as we wait for lab results, etc.   Wyn Quaker FNP

## 2017-12-10 NOTE — Telephone Encounter (Signed)
Called pt and advised message from the provider. Pt understood and verbalized understanding. Nothing further is needed.   She will fax the form to [employer].

## 2017-12-12 LAB — QUANTIFERON-TB GOLD PLUS
Mitogen-NIL: 3.96 IU/mL
NIL: 0.06 IU/mL
QuantiFERON-TB Gold Plus: NEGATIVE
TB1-NIL: 0 IU/mL
TB2-NIL: 0 IU/mL

## 2017-12-14 MED ORDER — PREDNISONE 20 MG PO TABS: 40.0000 mg | ORAL_TABLET | Freq: Every day | ORAL | 0 refills | Status: DC

## 2017-12-14 NOTE — Progress Notes (Signed)
Your QuantiFERON gold lab test have come back negative.  This means you are very unlikely that you have tuberculosis.  We will begin treatment with 40 mg of prednisone daily.  This should help you with your breathing, as well as with management of the pain as well.  Please keep close follow-up with our office with her you are feeling in your respiratory status.  If you have any questions or concerns please feel free to your office call.  Wyn Quaker FNP-C

## 2017-12-14 NOTE — Addendum Note (Signed)
Addended by: Lauraine Rinne on: 12/14/2017 10:13 PM   Modules accepted: Orders

## 2017-12-15 ENCOUNTER — Telehealth: Payer: Self-pay | Admitting: Pulmonary Disease

## 2017-12-15 NOTE — Progress Notes (Signed)
Reviewed and agree with assessment/plan.   Antavious Spanos, MD [facility] 07/16/2016, 12:24 PM Pager:  [phone]  

## 2017-12-15 NOTE — Telephone Encounter (Signed)
Yvonne Banks please advise on results.

## 2017-12-15 NOTE — Telephone Encounter (Signed)
Results are in Chart. See Jessica's inbox.  Gold is negative.  Sent in order for prednisone.  ACE level is elevated which is sometimes seen in sarcoidosis.  Keep follow-up.  Wyn Quaker FNP

## 2017-12-15 NOTE — Telephone Encounter (Signed)
Called pt and advised message from the provider. Pt understood and verbalized understanding.   Pt has several questions about sarcoidosis and I advised her that Aaron Edelman would go more in depth about this at her OV. She sated her OV was not until July and wanted her questions answered as soon as possible. She would like to know does this mean she has sarcoid? She wanted to know if she should change her diet? She requested to speak to [place] directly.

## 2017-12-16 ENCOUNTER — Telehealth: Payer: Self-pay | Admitting: Pulmonary Disease

## 2017-12-16 DIAGNOSIS — R059 Cough, unspecified: Secondary | ICD-10-CM

## 2017-12-16 DIAGNOSIS — R0602 Shortness of breath: Secondary | ICD-10-CM

## 2017-12-16 DIAGNOSIS — R918 Other nonspecific abnormal finding of lung field: Secondary | ICD-10-CM

## 2017-12-16 DIAGNOSIS — R05 Cough: Secondary | ICD-10-CM

## 2017-12-16 MED ORDER — PREDNISONE 20 MG PO TABS: 40.0000 mg | ORAL_TABLET | Freq: Every day | ORAL | 0 refills | Status: AC

## 2017-12-16 MED ORDER — BENZONATATE 200 MG PO CAPS: 200.0000 mg | ORAL_CAPSULE | Freq: Three times a day (TID) | ORAL | 0 refills | Status: DC | PRN

## 2017-12-16 MED ORDER — HYDROCODONE-HOMATROPINE 5-1.5 MG/5ML PO SYRP: 5.0000 mL | ORAL_SOLUTION | ORAL | 0 refills | Status: DC | PRN

## 2017-12-16 NOTE — Telephone Encounter (Signed)
Ok thanks Brian. 

## 2017-12-16 NOTE — Telephone Encounter (Signed)
Spoke with patient today.  Patient has started prednisone 40mg  daily.  Patient says that this has improved some of her chest pain as well as breathing.  Patient still having issues with cough.  Patient returning to work tomorrow can do [employer] over-the-counter for cough management as well as take her [employer]  Will refill Hycodan syrup to use at night as needed.   Patient follow-up with primary care and update them on her testing as well as her work-up for potential sarcoidosis.  Reviewed that ACE levels are elevated.  Results of bronchoscopy again.  Results of CT from the hospital.  And plan to complete follow-up pulmonary function testing.  Patient reports that her questions have been answered and knows that she can follow-up with the office if she continues to have any sort of problems.    Wyn Quaker FNP-C

## 2017-12-16 NOTE — Telephone Encounter (Signed)
Per 12/16/17 phone note, Aaron Edelman spoke with patient Will sign off

## 2017-12-16 NOTE — Telephone Encounter (Signed)
I will contact the patient after my clinic day today - after my 12pm appt.   Wyn Quaker FNP

## 2017-12-16 NOTE — Telephone Encounter (Signed)
Yvonne Banks, please advise if you have talked to this patient. Thanks!

## 2017-12-17 ENCOUNTER — Telehealth: Payer: Self-pay | Admitting: Pulmonary Disease

## 2017-12-17 ENCOUNTER — Encounter: Payer: Self-pay | Admitting: Emergency Medicine

## 2017-12-17 NOTE — Telephone Encounter (Signed)
Pt has the fax number for the HR [phone] Claudean Severance  Pt Number [phone]

## 2017-12-17 NOTE — Telephone Encounter (Signed)
Called and spoke to pt. Pt is requesting a letter sent to her employer to allow her to return to work today. Letter written and has been signed by Aaron Edelman, NP, and faxed to HR (see earlier messages). Called [employer] at [phone] and left VM to see if they received the [facility] paperwork for pt.   Will call [employer] back.

## 2017-12-17 NOTE — Telephone Encounter (Signed)
Left message for patient to call back  

## 2017-12-17 NOTE — Telephone Encounter (Signed)
Patient is returning call, CB is [phone].

## 2017-12-18 NOTE — Telephone Encounter (Signed)
Pt is calling for an update on FMLA paperwork. Pt would also like to be sure that her return to work letter states the date to return is May 30. Cb is [phone]

## 2017-12-18 NOTE — Telephone Encounter (Signed)
Attempted to call patient, no answer, left message to call back.  

## 2017-12-18 NOTE — Telephone Encounter (Signed)
Pt returning call. Cb is [phone].

## 2017-12-18 NOTE — Telephone Encounter (Signed)
Called and left message at  [employer] [phone] to see if FMLA paperwork was received. Will try again at a later time.

## 2017-12-18 NOTE — Telephone Encounter (Signed)
Called and spoke to patient. Patient stated that her work did receive the letter from NP and thanked [place] for that. Patient stated she had previously gone up to [employer] to pay for the paperwork and was told it was almost done and for some reason it still hasn't been processed. Patient stated she will follow back up with Sonia Baller at [facility] since she had been working with her. Patient stated nothing further is needed at this time.

## 2017-12-22 ENCOUNTER — Telehealth: Payer: Self-pay | Admitting: Pulmonary Disease

## 2017-12-22 NOTE — Telephone Encounter (Signed)
Rec'd completed FMLA forms back from [employer] to [employer] via interoffice mail - also called and notified patient that paperwork was complete -pr

## 2017-12-29 ENCOUNTER — Telehealth: Payer: Self-pay | Admitting: Pulmonary Disease

## 2017-12-29 NOTE — Telephone Encounter (Signed)
Spoke with the pt  She states taking pred 40 mg since 12/15/17  She has been having insomnia since then  She states also over the past wk feeling weakness in her legs, shakiness, sweats, and fatigue  She states that her cough has improved some since she was last seen, but her breathing is about the same  She is asking for recs on whether or not to continue on pred at 40 mg  Next ov 01/19/18 with Aaron Edelman with PFT same day   Please advise thanks!

## 2017-12-29 NOTE — Telephone Encounter (Signed)
Spoke with the pt and notified of recs per Aaron Edelman and she verbalized understanding  She will call if not improving and keep planned f/u

## 2017-12-29 NOTE — Telephone Encounter (Signed)
Difficult to say with management over the phone.    Personally I would prefer for her to remain on the prednisone as that helps manage her sarcoidosis.  That we are in the process of working up in order to diagnose.  Which is why she is on the schedule for 7/2 PFT and office visit for me.  We can definitely decrease her daily prednisone dose in order to help manage some of the side effects it sounds like she is happening.  Patient can go down to 30 mg of prednisone for the next week daily.  After that, she can go down to 20 mg of prednisone daily.  And hold at that dose.   Hopefully, this will help manage her symptoms such as the insomnia, fatigue, and sweats.  If it does not she is she is more than welcome to follow-up with [place].  Make sure that you are taking the prednisone in the morning with food.   Wyn Quaker FNP

## 2018-01-01 LAB — FUNGUS CULTURE WITH STAIN: FUNGUS STAIN: UNDETERMINED

## 2018-01-01 LAB — FUNGAL ORGANISM REFLEX

## 2018-01-06 ENCOUNTER — Ambulatory Visit (INDEPENDENT_AMBULATORY_CARE_PROVIDER_SITE_OTHER)
Admission: RE | Admit: 2018-01-06 | Discharge: 2018-01-06 | Disposition: A | Discharge status: Home / Self Care | Payer: 59 | Source: Ambulatory Visit | Attending: Pulmonary Disease | Admitting: Pulmonary Disease

## 2018-01-06 ENCOUNTER — Other Ambulatory Visit: Payer: 59

## 2018-01-06 ENCOUNTER — Telehealth: Payer: Self-pay | Admitting: Pulmonary Disease

## 2018-01-06 DIAGNOSIS — B459 Cryptococcosis, unspecified: Secondary | ICD-10-CM | POA: Diagnosis not present

## 2018-01-06 NOTE — Telephone Encounter (Signed)
Spoke with pt and advised results to the best of my ability. I ordered the lab test and xray and advised her to come in to have these tests performed.   Dr. Halford Chessman Please call pt back to explain in detail. She had some questions that I couldn't answer. She states she will have her phone at her desk all day to look out for your call.

## 2018-01-06 NOTE — Telephone Encounter (Signed)
Spoke with pt.  Explained test findings.  Not sure if this is incidental finding or marker of infection.  She has sweats.  No fever, cough, sputum, hemoptysis.  Tremors and insomnia improved with decrease in dose of prednisone.  Will call her back after CXR and cryptococcal Ab results available.

## 2018-01-06 NOTE — Telephone Encounter (Signed)
Patient returning call, CB is [phone].

## 2018-01-06 NOTE — Addendum Note (Signed)
Addended by: Trenda Moots on: 3/83/7793 10:19 AM   Modules accepted: Orders

## 2018-01-06 NOTE — Telephone Encounter (Signed)
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  Will need to order cryptococcal serum antigen and repeat chest xray.  Will need to taper her off prednisone, and determine if she needs to be started on fluconazole.  Might need referral to infectious disease.  Left message on her voice mail to call off and discuss test results.  Will route to my nurse to follow up with pt, and make sure she received message that I need to speak with her.

## 2018-01-08 ENCOUNTER — Telehealth: Payer: Self-pay | Admitting: Pulmonary Disease

## 2018-01-08 NOTE — Telephone Encounter (Signed)
I have already reviewed.

## 2018-01-08 NOTE — Telephone Encounter (Signed)
Dg Chest 2 View  Result Date: 01/06/2018 CLINICAL DATA:  Follow-up right-sided infiltrate EXAM: CHEST - 2 VIEW COMPARISON:  12/04/17 FINDINGS: Cardiac shadows within normal limits. The lungs are well aerated bilaterally. The previously seen right upper lobe density posteriorly on CT has nearly completely resolved. No new focal infiltrate is seen. Some residual changes in the right upper lobe anteriorly are again noted and stable. No new bony abnormality is seen. IMPRESSION: Significant improvement when compare with previous exams. No new acute abnormality noted. Electronically Signed   By: Inez Catalina M.D.   On: 01/06/2018 11:34    Cryptococcal Ag 01/06/18 >> 1:80   Results d/w pt.  Her cough has increased since prednisone decreased and more short of breath.  Will have her increase prednisone to 30 mg daily.  Explained that is is unlikely she has cryptococcal infection based on CXR improvement, and cryptococcal Ag level.

## 2018-01-08 NOTE — Telephone Encounter (Signed)
Received a call report from [facility] with [facility] regarding pt's labwork.  Pt's Cryptococcus Ag Titer came back positive.  Routing to Dr. Halford Chessman.

## 2018-01-12 ENCOUNTER — Ambulatory Visit (INDEPENDENT_AMBULATORY_CARE_PROVIDER_SITE_OTHER): Payer: 59 | Admitting: Pulmonary Disease

## 2018-01-12 ENCOUNTER — Encounter: Payer: Self-pay | Admitting: Pulmonary Disease

## 2018-01-12 VITALS — BP 120/88 | HR 97 | Temp 98.4°F | Ht 65.0 in | Wt 268.8 lb

## 2018-01-12 DIAGNOSIS — R0602 Shortness of breath: Secondary | ICD-10-CM

## 2018-01-12 DIAGNOSIS — R058 Other specified cough: Secondary | ICD-10-CM

## 2018-01-12 DIAGNOSIS — R059 Cough, unspecified: Secondary | ICD-10-CM

## 2018-01-12 DIAGNOSIS — R918 Other nonspecific abnormal finding of lung field: Secondary | ICD-10-CM

## 2018-01-12 DIAGNOSIS — R05 Cough: Secondary | ICD-10-CM

## 2018-01-12 MED ORDER — OMEPRAZOLE 20 MG PO CPDR: DELAYED_RELEASE_CAPSULE | ORAL | 4 refills | Status: DC

## 2018-01-12 MED ORDER — TRAMADOL HCL 50 MG PO TABS: 50.0000 mg | ORAL_TABLET | Freq: Four times a day (QID) | ORAL | 0 refills | Status: DC | PRN

## 2018-01-12 NOTE — Assessment & Plan Note (Signed)
Omeprazole 20 mg on empty stomach in the mornings Aggressively control allergies Follow-up in 1 week Pulmonary function test next week

## 2018-01-12 NOTE — Assessment & Plan Note (Signed)
Continue follow-up next week for pulmonary function tests We will decrease prednisone back down to 20 mg daily for the next week, then work to 10 mg daily for the following week Patient aware that this is all part of work-up for potential sarcoidosis

## 2018-01-12 NOTE — Patient Instructions (Addendum)
Decrease prednisone to 20 mg daily starting today, do that for 7 days >>> Then after next week's appointment will go down to 10 mg daily for 7 days  Cough management: . We believe you have a chronic/cyclical cough that is aggravated by reflux , coughing , and drainage.  . Goal is to not Cough or clear throat.  Marland Kitchen Avoid coughing or clearing throat by using:  o non-mint products/sugarless candy o Water o ice chips o Remember NO MINT PRODUCTS  . Medications to use:  o Mucinex DM 1-2 every 12 hrs or Delsym 2 tsp every 12 hrs f or cough o Tessalon Three times a day  As needed  Cough.  o Tramadol 50 mg every 4hrs as needed for breakthrough coughing until 100% cough free.  o Prilosec 20mg  30 min before breakfast  o Zyrtec 10mg  at bedtime o Chlor tabs 4mg  2 at bedtime  for nasal drip until cough is 100% cough free.    We will start omeprazole 20 mg in the morning to help with acid reflux and GERD  You can use your tramadol to help with the migraines if you have them, hopefully by decreasing the prednisone and will be improved.  Follow-up in 1 week with pulmonary function test as already scheduled      Please contact the office if your symptoms worsen or you have concerns that you are not improving.   Thank you for choosing [facility] for your healthcare, and for allowing [place] to partner with you on your healthcare journey. I am thankful to be able to provide care to you today.   Wyn Quaker FNP-C

## 2018-01-12 NOTE — Progress Notes (Signed)
Reviewed and agree with assessment/plan.   Wilna Pennie, MD [facility] 07/16/2016, 12:24 PM Pager:  [phone]  

## 2018-01-12 NOTE — Progress Notes (Signed)
@Patient  ID: Yvonne Banks, female    DOB: 05-Nov-1970, 47 y.o.   MRN: [id number]  Chief Complaint  Patient presents with  . Acute Visit    Body aches, headaches, lethargic, back pain, dry cough, chest pain x 1 week. Prednisone 30mg  daily.     Referring provider: Jonathon Jordan, MD  HPI: 47 year old female patient former smoker (quit smoking 2003) with history of recurrent sinus problems, GERD, cough initially and last seen on 12/11/2015 for initial consult with Dr. Melvyn Novas.  Encounters with Pulmonary:  12/03/2017-hospitalization- right lung mass, pleuritic chest pain >>>CTA of chest no acute PE, numerous right upper lobe masslike densities >>>bronchoscopy >>>Interventional radiology recommends obtaining PET scan prior to needle biopsy >>>Augmentin started by pulmonology   12/11/2015-initial office visit- [name] Upper airway cough syndrome treated for GERD Extensive discussion regarding how to manage and treat  Tests:  12/04/2017-bronchoscopy  >>>surgical path shows no evidence of malignancy  >>>vague granulomas 12/04/2017-chest x-ray- increased opacity in the right upper lobe 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  Patient presented to hospital on 5/16 had a CT showing no acute PE but right hilar adenopathy as well as right mass.  Patient reports feeling okay since leaving the hospital.  Still  having pleuritic chest pain and shortness of breath.  Patient also reporting that she is having a wheeze at times.  Patient states they are still taking their Augmentin currently on a 10-day prescription.   01/08/2018-telephone encounter-prednisone increased back to 30 mg daily d/t cough        01/12/18 Acute  47 year old patient reporting office today with worsening symptoms of shortness of breath, feeling hot, insomnia, cough.  Patient was recently instructed to go back up to 30 mg daily on her prednisone to help with treatment of cough.  Unfortunately patient has not been using any other cough treatments.  Patient also says that she feels hot often and sweats all the time.  Patient does report that her chest pain as well as her breathing has actually improved since being on the prednisone.  Patient wanted to report to our office today and see what she needs to do now.   Allergies  Allergen Reactions  . Adhesive [Tape] Other (See Comments)    Causes skin discoloration.  . Erythromycin Nausea And Vomiting  . Iron     Stomach pain. Pt can take Integra for iron  . Latex Other (See Comments)    Discolors skin  . Other     Spicy peppers - anything hot or spicy causes swelling and skin inflammation     There is no immunization history on file for this patient.  Past Medical History:  Diagnosis Date  . Allergy   . Anemia menometrorrhaghia   history  . Anxiety    no meds  . Bilateral knee pain   . Complication of anesthesia    woke up coughing after surgery May 2014 - required inhaler in recovery and for a week after that surgery--pt thinks  it was Symbicort  . Fibroids   . Gallstones    causing nausea and and midchest -abdominal pain  . GERD (gastroesophageal reflux disease)    History - resolved treatment with diet - no meds  . Herpes genitalis in women   . History of chlamydia   . IBS (irritable bowel syndrome)   . Insomnia   . Migraines    topamax  . MRSA (methicillin  resistant staph aureus) culture positive    skin infection7/2013   PT STATES NO INFECTION AT PRESENT AND SHE IS FOLLOWING HER DOCTOR'S ORDERS TO USE MUPIROCIN  OINTMENT BID IN NOSE FIRST 5 DAYS OF EACH MONTH THRU DEC 2014 AND HAS HIBICLENS TO [place] THE AREAS WHERE THE INFECTION HAS BEEN.  . PMS (premenstrual syndrome)   . Shortness of breath    with exertion  - pt feels weight related  . Thyromegaly   . Vitamin D deficiency     Tobacco History: Social History   Tobacco Use  Smoking Status Former Smoker  . Packs/day: 1.00  . Years: 15.00  . Pack years: 15.00  . Types: Cigarettes  . Last attempt to quit: 07/21/2000  . Years since quitting: 17.4  Smokeless Tobacco Never Used   Counseling given: Yes Continue to not smoke  Outpatient Encounter Medications as of 01/12/2018  Medication Sig  . amitriptyline (ELAVIL) 25 MG tablet Take 25 mg by mouth at bedtime.  . cetirizine (ZYRTEC) 10 MG tablet Take 10 mg by mouth daily as needed for allergies.   . Dapsone 5 % topical gel   . fluticasone (FLONASE) 50 MCG/ACT nasal spray Place 2 sprays into both nostrils daily.  Marland Kitchen HYDROcodone-homatropine (HYCODAN) 5-1.5 MG/5ML syrup Take 5 mLs by mouth every 4 (four) hours as needed for cough (use if guaiFENesin-codeine is not effective).  . montelukast (SINGULAIR) 10 MG tablet Take 10 mg by mouth at bedtime.  . predniSONE (DELTASONE) 20 MG tablet Take 2 tablets (40 mg total) by mouth daily for 30 doses.  . rizatriptan (MAXALT) 10 MG tablet Take 10 mg by mouth.  . sertraline (ZOLOFT) 100 MG tablet 200 mg at bedtime.   Marland Kitchen spironolactone (ALDACTONE) 25 MG tablet Take 25 mg by mouth 2 (two) times daily.  . traMADol (ULTRAM) 50 MG tablet Take 1 tablet (50 mg total) by mouth every 6 (six) hours as needed.  . triamcinolone cream (KENALOG) 0.1 % APPLY TO AFFECTED AREA AS DIRECTED FOR ITCHY RASH  . [DISCONTINUED] traMADol (ULTRAM) 50 MG tablet Take 1 tablet (50 mg total) by mouth every 6 (six) hours as needed.  Marland Kitchen  amoxicillin-clavulanate (AUGMENTIN) 250-125 MG tablet Take 1 tablet by mouth every 8 (eight) hours. (Patient not taking: Reported on 01/12/2018)  . benzonatate (TESSALON) 200 MG capsule Take 1 capsule (200 mg total) by mouth 3 (three) times daily as needed for cough (use if hycodan is not effective). (Patient not taking: Reported on 01/12/2018)  . omeprazole (PRILOSEC) 20 MG capsule Take 1 capsule (20 mg) by mouth daily in the morning and empty stomach with a glass of water   No facility-administered encounter medications on file as of 01/12/2018.      Review of Systems  Constitutional:  +fatigue, feels hot, no fever, profuse sweating    No  weight loss, night sweats,  fevers, chills HEENT:  +nasal congestion    No headaches,  Difficulty swallowing,  Tooth/dental problems, or  Sore throat, No sneezing, itching, ear ache CV: +occasional chest pain with coughing, but improved  No orthopnea, PND, swelling in lower extremities, anasarca, dizziness, palpitations, syncope  GI: No heartburn, indigestion, abdominal pain, nausea, vomiting, diarrhea, change in bowel habits, loss of appetite, bloody stools Resp: +sob with exertion, dry cough   No shortness of breath with exertion or at rest.  No excess mucus, no productive cough,No coughing up of blood.  No change in color of mucus.  No wheezing.  No chest wall deformity Skin: no rash, lesions, no skin changes. GU: no dysuria, change in color of urine, no urgency or frequency.  No flank pain, no hematuria  MS:  No joint pain or swelling.  No decreased range of motion.  No back pain. Psych:  No change in mood or affect. No depression or anxiety.  No memory loss.   Physical Exam  BP 120/88   Pulse 97   Temp 98.4 F (36.9 C) (Oral)   Ht 5\' 5"  (1.651 m)   Wt 268 lb 12.8 oz (121.9 kg)   SpO2 97%   BMI 44.73 kg/m   Wt Readings from Last 3 Encounters:  01/12/18 268 lb 12.8 oz (121.9 kg)  12/08/17 267 lb 3.2 oz (121.2 kg)  12/04/17 263 lb (119.3 kg)      GEN: A/Ox3; pleasant , NAD, well nourished, on RA   HEENT:  [place]/AT,  EACs-clear, TMs-wnl, NOSE-clear, THROAT- +post nasal drip  NECK:  Supple w/ fair ROM; no JVD; +submental and submandibular nodes tender  RESP:  Clear  P & A; w/o, wheezes/ rales/ or rhonchi. no accessory muscle use, no dullness to percussion  CARD:  RRR, no m/r/g, no peripheral edema, pulses intact, no cyanosis or clubbing.  GI:   Soft & nt; nml bowel sounds; no organomegaly or masses detected.   Musco: Warm bil, no deformities or joint swelling noted.   Neuro: alert, no focal deficits noted.    Skin: Warm, no lesions or rashes    Lab Results:  CBC    Component Value Date/Time   WBC 5.1 12/05/2017 0517   RBC 3.93 12/05/2017 0517   HGB 10.9 (L) 12/05/2017 0517   HCT 34.1 (L) 12/05/2017 0517   PLT 277 12/05/2017 0517   MCV 86.8 12/05/2017 0517   MCH 27.7 12/05/2017 0517   MCHC 32.0 12/05/2017 0517   RDW 14.6 12/05/2017 0517   LYMPHSABS 3.4 05/21/2013 0541   MONOABS 0.4 05/21/2013 0541   EOSABS 0.1 05/21/2013 0541   BASOSABS 0.0 05/21/2013 0541    BMET    Component Value Date/Time   NA 139 12/05/2017 0517   K 3.7 12/05/2017 0517   CL 106 12/05/2017 0517   CO2 24 12/05/2017 0517   GLUCOSE 120 (H) 12/05/2017 0517   BUN 15 12/05/2017 0517   CREATININE 0.75 12/05/2017 0517   CALCIUM 8.7 (L) 12/05/2017 0517   GFRNONAA >60 12/05/2017 0517   GFRAA >60 12/05/2017 0517    BNP No results found for: BNP  ProBNP No results found for: PROBNP  Imaging: Dg Chest 2 View  Result Date: 01/06/2018 CLINICAL DATA:  Follow-up right-sided infiltrate EXAM: CHEST - 2 VIEW COMPARISON:  12/04/17 FINDINGS: Cardiac shadows within normal limits. The lungs are well aerated bilaterally. The previously seen right upper lobe density posteriorly on CT has nearly completely resolved. No new focal infiltrate is seen. Some residual changes in the right upper lobe anteriorly are again noted and stable. No new bony  abnormality is seen. IMPRESSION: Significant improvement when compare with previous exams. No new acute abnormality noted. Electronically Signed  By: Inez Catalina M.D.   On: 01/06/2018 11:34     Assessment & Plan:   Pleasant 47 year old patient seen in office today.  I believe her symptoms are multifactorial partially related to her lung mass that we are currently working her up for as potentially sarcoidosis as well as the prednisone that we are treating her with.  Patient also has mild AR flare as well as probable GERD in the setting of her prednisone use.  Discussed extensively with patient that we need to aggressively treat cough, control allergy symptoms, start GERD treatment.  We will also work to decrease prednisone to see if that helps with any of the prednisone related side effects.  Patient can follow-up in 1 week as already scheduled with pulmonary function test   Lung mass Continue follow-up next week for pulmonary function tests We will decrease prednisone back down to 20 mg daily for the next week, then work to 10 mg daily for the following week Patient aware that this is all part of work-up for potential sarcoidosis  Cough Will work to aggressively control allergies, postnasal drip, as well as GERD We will refill tramadol, patient has enough for Hycodan syrup at this time  Upper airway cough syndrome Omeprazole 20 mg on empty stomach in the mornings Aggressively control allergies Follow-up in 1 week Pulmonary function test next week      Lauraine Rinne, NP 01/12/2018

## 2018-01-12 NOTE — Assessment & Plan Note (Signed)
Will work to aggressively control allergies, postnasal drip, as well as GERD We will refill tramadol, patient has enough for Hycodan syrup at this time

## 2018-01-14 ENCOUNTER — Telehealth: Payer: Self-pay | Admitting: Pulmonary Disease

## 2018-01-14 NOTE — Telephone Encounter (Signed)
Noted by triage Thank you Will sign off

## 2018-01-14 NOTE — Telephone Encounter (Signed)
I am aware of this result.  It is not significantly elevated.  This was d/w pt previous.  No additional intervention needed for this.

## 2018-01-15 LAB — CRYPTOCOCCAL ANTIGEN: CRYPTOCOCCUS ANTIGEN, SERUM: POSITIVE — AB

## 2018-01-15 LAB — CRYPTOCOCCUS AG TITER, SERUM

## 2018-01-18 ENCOUNTER — Other Ambulatory Visit: Payer: Self-pay | Admitting: Obstetrics and Gynecology

## 2018-01-18 NOTE — Progress Notes (Signed)
@Patient  ID: Yvonne Banks, female    DOB: 19-May-1971, 47 y.o.   MRN: [id number]  Chief Complaint  Patient presents with  . Follow-up    PFT today    Referring provider: Jonathon Jordan, MD  HPI: 47 year old female patient former smoker (quit smoking 2003) with history of recurrent sinus problems, GERD, cough initially and last seen on 12/11/2015 for initial consult with Dr. Melvyn Banks.  Encounters with Pulmonary:  12/03/2017-hospitalization- right lung mass, pleuritic chest pain >>>CTA of chest no acute PE, numerous right upper lobe masslike densities >>>bronchoscopy >>>Interventional radiology recommends obtaining PET scan prior to needle biopsy >>>Augmentin started by pulmonology  12/11/2015-initial office visit- [name] Upper airway cough syndrome treated for GERD Extensive discussion regarding how to manage and treat  Tests:  12/04/2017-bronchoscopy  >>>surgical path shows no evidence of malignancy  >>>vague granulomas 12/04/2017-chest x-ray- increased opacity in the right upper lobe 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  Patient presented to hospital on 5/16 had a CT showing no acute PE but right hilar adenopathy as well as right mass.  Patient reports feeling okay since leaving the hospital.  Still having pleuritic chest pain and shortness of breath.  Patient also reporting that she is having a  wheeze at times.  Patient states they are still taking their Augmentin currently on a 10-day prescription.   01/08/2018-telephone encounter-prednisone increased back to 30 mg daily d/t cough   01/12/18 Acute  47 year old patient reporting office today with worsening symptoms of shortness of breath, feeling hot, insomnia, cough.  Patient was recently instructed to go back up to 30 mg daily on her prednisone to help with treatment of cough.  Unfortunately patient has not been using any other cough treatments. Patient also says that she feels hot often and sweats all the time.  Patient does report that her chest pain as well as her breathing has actually improved since being on the prednisone.  Patient wanted to report to our office today and see what she needs to do now. Plan: Aggressive control of cough, start omeprazole, decrease prednisone to 20 mg, keep follow-up in 1 week with pulmonary function test, refill tramadol  Tests:  Imaging:   Cardiac:   Labs:   Micro:   Chart Review:      01/18/18 Office visit with pulmonary function test  Pleasant 47 year old patient seen office today after completion of pulmonary function test.  Patient completed the pulmonary function test with ease.  Pulmonary function test showing reduced lung volumes.  As well as decreased diffusion capacity.  No bronchodilator response.  PFT showing restrictive airway process.  Patient reports her cough has improved since being seen last week.  Patient is currently weaning down her prednisone.  With plans to start 10 mg daily today.  Patient still reporting that she sweats profusely with activity and also when in air conditioning at work.  Patient does think that this is somewhat gotten better as we have been decreasing the prednisone.  Still having issues sleeping  at night which is also somewhat improved since we have gone down from 30 mg to 20mg  of prednisone.   Allergies  Allergen Reactions  . Adhesive [Tape] Other (See Comments)    Causes skin discoloration.  . Erythromycin Nausea And Vomiting  . Iron     Stomach pain. Pt can take Integra for iron  . Latex Other (See Comments)    Discolors skin  . Other     Spicy peppers - anything hot or spicy causes swelling and skin inflammation     There is no immunization history on file for this patient.  Past Medical History:  Diagnosis Date  . Allergy   . Anemia menometrorrhaghia   history  . Anxiety    no meds  . Bilateral knee pain   . Complication of anesthesia    woke up coughing after surgery May 2014 - required inhaler in recovery and for a week after that surgery--pt thinks it was Symbicort  . Fibroids   . Gallstones    causing nausea and and midchest -abdominal pain  . GERD (gastroesophageal reflux disease)    History - resolved treatment with diet - no meds  . Herpes genitalis in women   . History of chlamydia   . IBS (irritable bowel syndrome)   . Insomnia   . Migraines    topamax  . MRSA (methicillin resistant staph aureus) culture positive    skin infection7/2013   PT STATES NO INFECTION AT PRESENT AND SHE IS FOLLOWING HER DOCTOR'S ORDERS TO USE MUPIROCIN  OINTMENT BID IN NOSE FIRST 5 DAYS OF EACH MONTH THRU DEC 2014 AND HAS HIBICLENS TO [place] THE AREAS WHERE THE INFECTION HAS BEEN.  . PMS (premenstrual syndrome)   . Shortness of breath    with exertion  - pt feels weight related  . Thyromegaly   . Vitamin D deficiency     Tobacco History: Social History   Tobacco Use  Smoking Status Former Smoker  . Packs/day: 1.00  . Years: 15.00  . Pack years: 15.00  . Types: Cigarettes  . Last attempt to quit: 07/21/2000  . Years since quitting: 17.5  Smokeless Tobacco Never Used   Counseling given: Not Answered Continue not smoking.  Outpatient Encounter Medications as of  01/19/2018  Medication Sig  . amitriptyline (ELAVIL) 25 MG tablet Take 25 mg by mouth at bedtime.  . benzonatate (TESSALON) 200 MG capsule Take 1 capsule (200 mg total) by mouth 3 (three) times daily as needed for cough (use if hycodan is not effective).  . cetirizine (ZYRTEC) 10 MG tablet Take 10 mg by mouth daily as needed for allergies.   Marland Kitchen  Dapsone 5 % topical gel   . fluticasone (FLONASE) 50 MCG/ACT nasal spray Place 2 sprays into both nostrils daily.  Marland Kitchen HYDROcodone-homatropine (HYCODAN) 5-1.5 MG/5ML syrup Take 5 mLs by mouth every 4 (four) hours as needed for cough (use if guaiFENesin-codeine is not effective).  . montelukast (SINGULAIR) 10 MG tablet Take 10 mg by mouth at bedtime.  Marland Kitchen omeprazole (PRILOSEC) 20 MG capsule Take 1 capsule (20 mg) by mouth daily in the morning and empty stomach with a glass of water  . rizatriptan (MAXALT) 10 MG tablet Take 10 mg by mouth.  . sertraline (ZOLOFT) 100 MG tablet 200 mg at bedtime.   Marland Kitchen spironolactone (ALDACTONE) 25 MG tablet Take 25 mg by mouth 2 (two) times daily.  . traMADol (ULTRAM) 50 MG tablet Take 1 tablet (50 mg total) by mouth every 6 (six) hours as needed.  . triamcinolone cream (KENALOG) 0.1 % APPLY TO AFFECTED AREA AS DIRECTED FOR ITCHY RASH  . [DISCONTINUED] amoxicillin-clavulanate (AUGMENTIN) 250-125 MG tablet Take 1 tablet by mouth every 8 (eight) hours.   No facility-administered encounter medications on file as of 01/19/2018.      Review of Systems  Constitutional:   No  weight loss, night sweats,  fevers, chills, fatigue, or  lassitude HEENT:   No headaches,  Difficulty swallowing,  Tooth/dental problems, or  Sore throat, No sneezing, itching, ear ache, nasal congestion, post nasal drip  CV: No chest pain,  orthopnea, PND, swelling in lower extremities, anasarca, dizziness, palpitations, syncope  GI: No heartburn, indigestion, abdominal pain, nausea, vomiting, diarrhea, change in bowel habits, loss of appetite, bloody stools Resp:  No shortness of breath with exertion or at rest.  No excess mucus, no productive cough,  No non-productive cough,  No coughing up of blood.  No change in color of mucus.  No wheezing.  No chest wall deformity Skin: no rash, lesions, no skin changes. GU: no dysuria, change in color of urine, no urgency or frequency.  No flank pain, no hematuria  MS:  No joint pain or swelling.  No decreased range of motion.  No back pain. Psych:  No change in mood or affect. No depression or anxiety.  No memory loss.   Physical Exam  BP 122/82 (BP Location: Left Arm, Cuff Size: Large)   Pulse (!) 102   Ht 5' 5.5" (1.664 m)   Wt 269 lb (122 kg)   SpO2 96%   BMI 44.08 kg/m   Wt Readings from Last 3 Encounters:  01/19/18 269 lb (122 kg)  01/12/18 268 lb 12.8 oz (121.9 kg)  12/08/17 267 lb 3.2 oz (121.2 kg)    GEN: A/Ox3; pleasant , NAD, well nourished    HEENT:  [place]/AT,  EACs-clear, TMs-wnl, NOSE-clear, THROAT-clear, no lesions, no postnasal drip or exudate noted.   NECK:  Supple w/ fair ROM; no JVD;  no lymphadenopathy.    RESP:  Clear  P & A; w/o, wheezes/ rales/ or rhonchi. no accessory muscle use, no dullness to percussion  CARD:  +trace edema bilaterally in LE   RRR, no m/r/g,  pulses intact, no cyanosis or clubbing.  GI:   Soft & nt; nml bowel sounds; no organomegaly or masses detected.   Musco: Warm bil, no deformities or joint swelling noted.   Neuro: alert, no focal deficits noted.    Skin: Warm, no lesions or rashes    Lab Results:  CBC    Component Value Date/Time   WBC 5.1 12/05/2017 0517  RBC 3.93 12/05/2017 0517   HGB 10.9 (L) 12/05/2017 0517   HCT 34.1 (L) 12/05/2017 0517   PLT 277 12/05/2017 0517   MCV 86.8 12/05/2017 0517   MCH 27.7 12/05/2017 0517   MCHC 32.0 12/05/2017 0517   RDW 14.6 12/05/2017 0517   LYMPHSABS 3.4 05/21/2013 0541   MONOABS 0.4 05/21/2013 0541   EOSABS 0.1 05/21/2013 0541   BASOSABS 0.0 05/21/2013 0541    BMET    Component Value  Date/Time   NA 139 12/05/2017 0517   K 3.7 12/05/2017 0517   CL 106 12/05/2017 0517   CO2 24 12/05/2017 0517   GLUCOSE 120 (H) 12/05/2017 0517   BUN 15 12/05/2017 0517   CREATININE 0.75 12/05/2017 0517   CALCIUM 8.7 (L) 12/05/2017 0517   GFRNONAA >60 12/05/2017 0517   GFRAA >60 12/05/2017 0517    BNP No results found for: BNP  ProBNP No results found for: PROBNP  Imaging: Dg Chest 2 View  Result Date: 01/06/2018 CLINICAL DATA:  Follow-up right-sided infiltrate EXAM: CHEST - 2 VIEW COMPARISON:  12/04/17 FINDINGS: Cardiac shadows within normal limits. The lungs are well aerated bilaterally. The previously seen right upper lobe density posteriorly on CT has nearly completely resolved. No new focal infiltrate is seen. Some residual changes in the right upper lobe anteriorly are again noted and stable. No new bony abnormality is seen. IMPRESSION: Significant improvement when compare with previous exams. No new acute abnormality noted. Electronically Signed   By: Inez Catalina M.D.   On: 01/06/2018 11:34     Assessment & Plan:   Pleasant patient seen office today.  Discussed pulmonary function test results.  Discussed plan for patient to follow-up with Dr. said in 4 to 6 weeks.  Patient to complete 6-minute walk prior to seeing Dr. said.  I am okay with patient working to wean down prednisone as she continues to have side effects with prednisone use.  Patient to go to 10 mg daily for the next week and then can stop.  If respiratory status worsens or chest pain occurs she knows to contact our office.  Discussed with patient she may benefit from a therapeutic trial of Symbicort after stopping prednisone.  We will continue to monitor her symptoms.  Also encourage patient to try to exercise and improve conditioning as I feel that may also be contributing to some of her symptoms.  Shortness of breath Continue to wean down prednisone >>> 10 mg daily for the next week, then stop >>> Monitor  respiratory status as well as chest pain, if symptoms resume start taking 10 mg daily again and contact our office >>> We may consider a therapeutic trial of Symbicort after you stop your daily prednisone  Follow-up with Dr. Halford Chessman in 4 to 6 weeks  Continue to work to exercise/walk daily.   Restrictive airway disease Continue to wean down prednisone >>> 10 mg daily for the next week, then stop >>> Monitor respiratory status as well as chest pain, if symptoms resume start taking 10 mg daily again and contact our office >>> We may consider a therapeutic trial of Symbicort after you stop your daily prednisone  Follow-up with Dr. Halford Chessman in 4 to 6 weeks  Continue to work to exercise/walk daily.   Cough Continue current regimen for cough      Lauraine Rinne, NP 01/19/2018

## 2018-01-19 ENCOUNTER — Ambulatory Visit (INDEPENDENT_AMBULATORY_CARE_PROVIDER_SITE_OTHER): Payer: 59 | Admitting: Pulmonary Disease

## 2018-01-19 ENCOUNTER — Encounter: Payer: Self-pay | Admitting: Pulmonary Disease

## 2018-01-19 DIAGNOSIS — R059 Cough, unspecified: Secondary | ICD-10-CM

## 2018-01-19 DIAGNOSIS — R0602 Shortness of breath: Secondary | ICD-10-CM

## 2018-01-19 DIAGNOSIS — R05 Cough: Secondary | ICD-10-CM

## 2018-01-19 DIAGNOSIS — J984 Other disorders of lung: Secondary | ICD-10-CM | POA: Diagnosis not present

## 2018-01-19 LAB — PULMONARY FUNCTION TEST
DL/VA % PRED: 95 %
DL/VA: 4.73 ml/min/mmHg/L
DLCO UNC % PRED: 63 %
DLCO cor % pred: 69 %
DLCO cor: 18.31 ml/min/mmHg
DLCO unc: 16.73 ml/min/mmHg
FEF 25-75 Post: 2.75 L/sec
FEF 25-75 Pre: 2.89 L/sec
FEF2575-%CHANGE-POST: -4 %
FEF2575-%PRED-POST: 100 %
FEF2575-%Pred-Pre: 106 %
FEV1-%CHANGE-POST: -2 %
FEV1-%Pred-Post: 80 %
FEV1-%Pred-Pre: 82 %
FEV1-PRE: 2.11 L
FEV1-Post: 2.06 L
FEV1FVC-%CHANGE-POST: 0 %
FEV1FVC-%PRED-PRE: 107 %
FEV6-%Change-Post: -5 %
FEV6-%Pred-Post: 73 %
FEV6-%Pred-Pre: 77 %
FEV6-Post: 2.26 L
FEV6-Pre: 2.4 L
FEV6FVC-%Pred-Post: 103 %
FEV6FVC-%Pred-Pre: 103 %
FVC-%CHANGE-POST: -2 %
FVC-%PRED-PRE: 75 %
FVC-%Pred-Post: 73 %
FVC-POST: 2.33 L
FVC-PRE: 2.4 L
POST FEV1/FVC RATIO: 89 %
PRE FEV6/FVC RATIO: 100 %
Post FEV6/FVC ratio: 100 %
Pre FEV1/FVC ratio: 88 %
RV % PRED: 90 %
RV: 1.59 L
TLC % pred: 80 %
TLC: 4.27 L

## 2018-01-19 NOTE — Patient Instructions (Addendum)
Continue to wean down prednisone >>> 10 mg daily for the next week, then stop >>> Monitor respiratory status as well as chest pain, if symptoms resume start taking 10 mg daily again and contact our office >>> We may consider a therapeutic trial of Symbicort after you stop your daily prednisone  Follow-up with Dr. Halford Chessman in 4 to 6 weeks  Continue to work to exercise/walk daily.       Please contact the office if your symptoms worsen or you have concerns that you are not improving.   Thank you for choosing [facility] for your healthcare, and for allowing [place] to partner with you on your healthcare journey. I am thankful to be able to provide care to you today.   Wyn Quaker FNP-C

## 2018-01-19 NOTE — Assessment & Plan Note (Signed)
Continue to wean down prednisone >>> 10 mg daily for the next week, then stop >>> Monitor respiratory status as well as chest pain, if symptoms resume start taking 10 mg daily again and contact our office >>> We may consider a therapeutic trial of Symbicort after you stop your daily prednisone  Follow-up with Dr. Halford Chessman in 4 to 6 weeks  Continue to work to exercise/walk daily.

## 2018-01-19 NOTE — Assessment & Plan Note (Signed)
Continue current regimen for cough

## 2018-01-19 NOTE — Progress Notes (Signed)
PFT done today. 

## 2018-01-19 NOTE — Progress Notes (Signed)
Reviewed and agree with assessment/plan.   Graig Hessling, MD [facility] 07/16/2016, 12:24 PM Pager:  [phone]  

## 2018-01-20 NOTE — Progress Notes (Signed)
Discussed results with patient in office.  Nothing further is needed at this time.  Brian Mack FNP  

## 2018-02-04 LAB — ACID FAST CULTURE WITH REFLEXED SENSITIVITIES: ACID FAST CULTURE - AFSCU3: NEGATIVE

## 2018-02-10 ENCOUNTER — Telehealth: Payer: Self-pay | Admitting: Pulmonary Disease

## 2018-02-10 NOTE — Telephone Encounter (Signed)
Called and spoke with pt regarding not feeling well Pt has dry cough, SOB with exertion, chest tightness, and some pain in chest. Pt tried Delsym OTC and sugarless candy; nothing is working in last 5 days Pt would like to know if something could be called in or does she need to come in for OV with NP  VS please advise

## 2018-02-10 NOTE — Telephone Encounter (Signed)
Sounds like she needs to be back on prednisone  Please have her start prednisone 10 mg daily

## 2018-02-10 NOTE — Telephone Encounter (Signed)
Spoke with patient-she is aware to start Prednisone 10mg  daily per VS. Pt has rx at home. Nothing more needed at this time.

## 2018-02-17 ENCOUNTER — Encounter (HOSPITAL_COMMUNITY): Payer: Self-pay | Admitting: *Deleted

## 2018-02-17 ENCOUNTER — Other Ambulatory Visit: Payer: Self-pay | Admitting: Obstetrics and Gynecology

## 2018-02-17 NOTE — Patient Instructions (Addendum)
Your procedure is scheduled on:  Friday, Aug 9  Enter through the Main Entrance of [facility] at: 9:30 am  Pick up the phone at the desk and dial [phone].  Call this number if you have problems the morning of surgery: [phone].  Remember: Do NOT eat food or Do NOT drink clear liquids (including water) after midnight Thursday.  Take these medicines the morning of surgery with a SIP OF WATER: prilosec, prednisone and flonase nasal spray  Brush your teeth on the morning of surgery.  Stop herbal medications, vitamin supplements, Ibuprofen/NSAIDS at this time.  Do NOT wear jewelry (body piercing), metal hair clips/bobby pins, make-up, or nail polish. Do NOT wear lotions, powders, or perfumes.  You may wear deoderant. Do NOT shave for 48 hours prior to surgery. Do NOT bring valuables to the hospital.  Have a responsible adult drive you home and stay with you for 24 hours after your procedure.  Home with Yvonne Banks cell [phone].

## 2018-02-18 ENCOUNTER — Telehealth: Payer: Self-pay | Admitting: Pulmonary Disease

## 2018-02-18 NOTE — Telephone Encounter (Signed)
Form and OV notes have been faxed. Nothing further was needed.

## 2018-02-18 NOTE — Telephone Encounter (Signed)
Received surgical clearance form for patient to have LEEP on 02/19/18.  Form completed and in my office.  Please fax to number listed along with most recent office notes from me and NPs.

## 2018-02-19 ENCOUNTER — Other Ambulatory Visit: Payer: Self-pay

## 2018-02-19 ENCOUNTER — Encounter (HOSPITAL_COMMUNITY): Payer: Self-pay

## 2018-02-19 ENCOUNTER — Encounter (HOSPITAL_COMMUNITY)
Admission: RE | Admit: 2018-02-19 | Discharge: 2018-02-19 | Disposition: A | Discharge status: Home / Self Care | Payer: 59 | Source: Ambulatory Visit | Attending: Obstetrics and Gynecology | Admitting: Obstetrics and Gynecology

## 2018-02-19 DIAGNOSIS — R87612 Low grade squamous intraepithelial lesion on cytologic smear of cervix (LGSIL): Secondary | ICD-10-CM | POA: Insufficient documentation

## 2018-02-19 DIAGNOSIS — Z01812 Encounter for preprocedural laboratory examination: Secondary | ICD-10-CM | POA: Insufficient documentation

## 2018-02-19 HISTORY — DX: Major depressive disorder, single episode, unspecified: F32.9

## 2018-02-19 HISTORY — DX: Depression, unspecified: F32.A

## 2018-02-19 LAB — BASIC METABOLIC PANEL
ANION GAP: 10 (ref 5–15)
BUN: 14 mg/dL (ref 6–20)
CO2: 23 mmol/L (ref 22–32)
Calcium: 9.1 mg/dL (ref 8.9–10.3)
Chloride: 102 mmol/L (ref 98–111)
Creatinine, Ser: 0.79 mg/dL (ref 0.44–1.00)
GLUCOSE: 82 mg/dL (ref 70–99)
POTASSIUM: 4.1 mmol/L (ref 3.5–5.1)
Sodium: 135 mmol/L (ref 135–145)

## 2018-02-19 LAB — CBC
HEMATOCRIT: 39.1 % (ref 36.0–46.0)
Hemoglobin: 12.9 g/dL (ref 12.0–15.0)
MCH: 27.4 pg (ref 26.0–34.0)
MCHC: 33 g/dL (ref 30.0–36.0)
MCV: 83.2 fL (ref 78.0–100.0)
PLATELETS: 325 10*3/uL (ref 150–400)
RBC: 4.7 MIL/uL (ref 3.87–5.11)
RDW: 14.3 % (ref 11.5–15.5)
WBC: 9.5 10*3/uL (ref 4.0–10.5)

## 2018-02-19 NOTE — Pre-Procedure Instructions (Signed)
Reviewed patient's medical hx and medications with Dr. Gifford Shave.  Patient recently dx with sarcoidosis, treatment with prednisone 10 mg daily.  Per Dr. Gifford Shave, patient to continue taking prednisone.  Patient informed and verbalized understanding.

## 2018-02-23 ENCOUNTER — Ambulatory Visit (INDEPENDENT_AMBULATORY_CARE_PROVIDER_SITE_OTHER)
Admission: RE | Admit: 2018-02-23 | Discharge: 2018-02-23 | Disposition: A | Discharge status: Home / Self Care | Payer: 59 | Source: Ambulatory Visit | Attending: Pulmonary Disease | Admitting: Pulmonary Disease

## 2018-02-23 ENCOUNTER — Telehealth: Payer: Self-pay | Admitting: Pulmonary Disease

## 2018-02-23 DIAGNOSIS — D869 Sarcoidosis, unspecified: Secondary | ICD-10-CM

## 2018-02-23 DIAGNOSIS — R0602 Shortness of breath: Secondary | ICD-10-CM | POA: Diagnosis not present

## 2018-02-23 NOTE — Telephone Encounter (Signed)
Called and spoke with patient regarding her CXR results Pt verbalized understanding from VS call today, and aware of CT scan next week Nothing further needed.

## 2018-02-23 NOTE — Telephone Encounter (Signed)
Can she come in for a chest xray at least?

## 2018-02-23 NOTE — Telephone Encounter (Signed)
Dg Chest 2 View  Result Date: 02/23/2018 CLINICAL DATA:  Chest pain and shortness of breath EXAM: CHEST - 2 VIEW COMPARISON:  January 06, 2018 chest radiograph and chest CT Dec 03, 2017 FINDINGS: There is persistent increased opacity in the right upper lobe near the apex, less well delineated than on prior CT. There is mild scarring in the lingula. Lungs elsewhere are clear. Heart size and pulmonary vascular normal. No adenopathy. No bone lesions. IMPRESSION: Ill-defined opacity apical segment right upper lobe, better seen on prior CT. This area is felt to warrant noncontrast enhanced chest CT to further evaluate and compared directly with prior CT. Lungs elsewhere are clear except for slight scarring in the lingula. No adenopathy evident. Stable cardiac silhouette. These results will be called to the ordering clinician or representative by the Radiologist Assistant, and communication documented in the PACS or zVision Dashboard. Electronically Signed   By: Lowella Grip III M.D.   On: 02/23/2018 13:35     Results d/w pt.  Will arrange for CT chest with IV contrast.

## 2018-02-23 NOTE — Telephone Encounter (Signed)
Called and spoke with patient regarding increase cough and chest pain Pt reports in last 3 days increase of sharp stabbing pain on rt side, dry cough, SOB with exertion. Pt denies fever, chills, wheezing. Pt reports increase back pain in last week Pt has upcoming ov with VS 03/03/2018, denies coming in office to see NP today  VS please advise

## 2018-02-23 NOTE — Telephone Encounter (Signed)
Yvonne Banks with [facility] calling on cxr from 02/23/18  Impression reads:  IMPRESSION: Ill-defined opacity apical segment right upper lobe, better seen on prior CT. This area is felt to warrant noncontrast enhanced chest CT to further evaluate and compared directly with prior CT. Lungs elsewhere are clear except for slight scarring in the lingula. No adenopathy evident. Stable cardiac silhouette.  These results will be called to the ordering clinician or representative by the Radiologist Assistant, and communication documented in the PACS or zVision Dashboard.

## 2018-02-23 NOTE — Telephone Encounter (Signed)
CXR reviewed and ordered CT chest.

## 2018-02-23 NOTE — Telephone Encounter (Signed)
Called and spoke with patient regarding VS recommendation for cxr Placed order for cxr today Pt will come in for cxr this afternoon before 5pm Routing message to VS for review

## 2018-02-26 ENCOUNTER — Ambulatory Visit (HOSPITAL_COMMUNITY): Payer: 59 | Admitting: Anesthesiology

## 2018-02-26 ENCOUNTER — Encounter (HOSPITAL_COMMUNITY)
Admission: RE | Disposition: A | Discharge status: Home / Self Care | Payer: Self-pay | Source: Ambulatory Visit | Attending: Obstetrics and Gynecology

## 2018-02-26 ENCOUNTER — Ambulatory Visit (HOSPITAL_COMMUNITY)
Admission: RE | Admit: 2018-02-26 | Discharge: 2018-02-26 | Disposition: A | Discharge status: Home / Self Care | Payer: 59 | Source: Ambulatory Visit | Attending: Obstetrics and Gynecology | Admitting: Obstetrics and Gynecology

## 2018-02-26 ENCOUNTER — Encounter (HOSPITAL_COMMUNITY): Payer: Self-pay

## 2018-02-26 ENCOUNTER — Other Ambulatory Visit: Payer: Self-pay

## 2018-02-26 DIAGNOSIS — K219 Gastro-esophageal reflux disease without esophagitis: Secondary | ICD-10-CM | POA: Diagnosis not present

## 2018-02-26 DIAGNOSIS — F419 Anxiety disorder, unspecified: Secondary | ICD-10-CM | POA: Diagnosis not present

## 2018-02-26 DIAGNOSIS — N870 Mild cervical dysplasia: Secondary | ICD-10-CM | POA: Diagnosis present

## 2018-02-26 DIAGNOSIS — F329 Major depressive disorder, single episode, unspecified: Secondary | ICD-10-CM | POA: Insufficient documentation

## 2018-02-26 DIAGNOSIS — Z79899 Other long term (current) drug therapy: Secondary | ICD-10-CM | POA: Insufficient documentation

## 2018-02-26 DIAGNOSIS — Z9889 Other specified postprocedural states: Secondary | ICD-10-CM

## 2018-02-26 DIAGNOSIS — D869 Sarcoidosis, unspecified: Secondary | ICD-10-CM | POA: Diagnosis not present

## 2018-02-26 DIAGNOSIS — Z87891 Personal history of nicotine dependence: Secondary | ICD-10-CM | POA: Insufficient documentation

## 2018-02-26 HISTORY — DX: Sarcoidosis, unspecified: D86.9

## 2018-02-26 HISTORY — PX: LEEP: SHX91

## 2018-02-26 LAB — PREGNANCY, URINE: Preg Test, Ur: NEGATIVE

## 2018-02-26 SURGERY — LEEP (LOOP ELECTROSURGICAL EXCISION PROCEDURE)
Anesthesia: General | Site: Vagina

## 2018-02-26 MED ORDER — LIDOCAINE HCL 1 % IJ SOLN
INTRAMUSCULAR | Status: AC
  Filled 2018-02-26: qty 20

## 2018-02-26 MED ORDER — VASOPRESSIN 20 UNIT/ML IV SOLN
INTRAVENOUS | Status: DC | PRN
  Administered 2018-02-26: 10 mL via INTRAMUSCULAR

## 2018-02-26 MED ORDER — OXYCODONE-ACETAMINOPHEN 5-325 MG PO TABS: 1.0000 | ORAL_TABLET | Freq: Four times a day (QID) | ORAL | 0 refills | Status: DC | PRN

## 2018-02-26 MED ORDER — OXYCODONE HCL 5 MG PO TABS
5.0000 mg | ORAL_TABLET | Freq: Once | ORAL | Status: AC | PRN
  Administered 2018-02-26: 5 mg via ORAL

## 2018-02-26 MED ORDER — FERRIC SUBSULFATE 259 MG/GM EX SOLN
CUTANEOUS | Status: AC
  Filled 2018-02-26: qty 8

## 2018-02-26 MED ORDER — LACTATED RINGERS IV SOLN: INTRAVENOUS | Status: DC

## 2018-02-26 MED ORDER — SCOPOLAMINE 1 MG/3DAYS TD PT72
1.0000 | MEDICATED_PATCH | Freq: Once | TRANSDERMAL | Status: DC
  Administered 2018-02-26: 1.5 mg via TRANSDERMAL

## 2018-02-26 MED ORDER — FENTANYL CITRATE (PF) 100 MCG/2ML IJ SOLN
INTRAMUSCULAR | Status: DC | PRN
  Administered 2018-02-26: 50 ug via INTRAVENOUS
  Administered 2018-02-26 (×2): 100 ug via INTRAVENOUS

## 2018-02-26 MED ORDER — SCOPOLAMINE 1 MG/3DAYS TD PT72
MEDICATED_PATCH | TRANSDERMAL | Status: AC
  Administered 2018-02-26: 1.5 mg via TRANSDERMAL
  Filled 2018-02-26: qty 1

## 2018-02-26 MED ORDER — ONDANSETRON HCL 4 MG/2ML IJ SOLN
INTRAMUSCULAR | Status: AC
  Filled 2018-02-26: qty 2

## 2018-02-26 MED ORDER — MIDAZOLAM HCL 2 MG/2ML IJ SOLN
INTRAMUSCULAR | Status: DC | PRN
  Administered 2018-02-26: 2 mg via INTRAVENOUS

## 2018-02-26 MED ORDER — LACTATED RINGERS IV SOLN
INTRAVENOUS | Status: DC
  Administered 2018-02-26: 10:00:00 via INTRAVENOUS

## 2018-02-26 MED ORDER — MEPERIDINE HCL 25 MG/ML IJ SOLN: 6.2500 mg | INTRAMUSCULAR | Status: DC | PRN

## 2018-02-26 MED ORDER — PROPOFOL 10 MG/ML IV BOLUS
INTRAVENOUS | Status: AC
  Filled 2018-02-26: qty 40

## 2018-02-26 MED ORDER — IODINE STRONG (LUGOLS) 5 % PO SOLN
ORAL | Status: AC
  Filled 2018-02-26: qty 1

## 2018-02-26 MED ORDER — DEXAMETHASONE SODIUM PHOSPHATE 10 MG/ML IJ SOLN
INTRAMUSCULAR | Status: DC | PRN
  Administered 2018-02-26: 4 mg via INTRAVENOUS

## 2018-02-26 MED ORDER — LIDOCAINE HCL 1 % IJ SOLN
INTRAMUSCULAR | Status: DC | PRN
  Administered 2018-02-26: 10 mL

## 2018-02-26 MED ORDER — OXYCODONE HCL 5 MG/5ML PO SOLN: 5.0000 mg | Freq: Once | ORAL | Status: AC | PRN

## 2018-02-26 MED ORDER — PROMETHAZINE HCL 25 MG/ML IJ SOLN
6.2500 mg | INTRAMUSCULAR | Status: DC | PRN
  Administered 2018-02-26: 6.25 mg via INTRAVENOUS

## 2018-02-26 MED ORDER — OXYCODONE HCL 5 MG PO TABS
ORAL_TABLET | ORAL | Status: AC
  Filled 2018-02-26: qty 1

## 2018-02-26 MED ORDER — FENTANYL CITRATE (PF) 100 MCG/2ML IJ SOLN: 25.0000 ug | INTRAMUSCULAR | Status: DC | PRN

## 2018-02-26 MED ORDER — IBUPROFEN 600 MG PO TABS: 600.0000 mg | ORAL_TABLET | Freq: Four times a day (QID) | ORAL | 0 refills | Status: DC | PRN

## 2018-02-26 MED ORDER — LIDOCAINE HCL (CARDIAC) PF 100 MG/5ML IV SOSY
PREFILLED_SYRINGE | INTRAVENOUS | Status: DC | PRN
  Administered 2018-02-26: 300 mg via INTRAVENOUS

## 2018-02-26 MED ORDER — VASOPRESSIN 20 UNIT/ML IV SOLN
INTRAVENOUS | Status: AC
  Filled 2018-02-26: qty 1

## 2018-02-26 MED ORDER — PROMETHAZINE HCL 25 MG/ML IJ SOLN
INTRAMUSCULAR | Status: AC
  Administered 2018-02-26: 6.25 mg via INTRAVENOUS
  Filled 2018-02-26: qty 1

## 2018-02-26 MED ORDER — FENTANYL CITRATE (PF) 250 MCG/5ML IJ SOLN
INTRAMUSCULAR | Status: AC
  Filled 2018-02-26: qty 5

## 2018-02-26 MED ORDER — ACETIC ACID 5 % SOLN
Status: AC
  Filled 2018-02-26: qty 500

## 2018-02-26 MED ORDER — LIDOCAINE HCL (CARDIAC) PF 100 MG/5ML IV SOSY
PREFILLED_SYRINGE | INTRAVENOUS | Status: AC
  Filled 2018-02-26: qty 5

## 2018-02-26 MED ORDER — FERRIC SUBSULFATE SOLN
Status: DC | PRN
  Administered 2018-02-26: 1

## 2018-02-26 MED ORDER — GLYCOPYRROLATE 0.2 MG/ML IJ SOLN
INTRAMUSCULAR | Status: DC | PRN
  Administered 2018-02-26: 0.2 mg via INTRAVENOUS

## 2018-02-26 MED ORDER — DEXAMETHASONE SODIUM PHOSPHATE 4 MG/ML IJ SOLN
INTRAMUSCULAR | Status: AC
  Filled 2018-02-26: qty 1

## 2018-02-26 MED ORDER — ONDANSETRON HCL 4 MG/2ML IJ SOLN
INTRAMUSCULAR | Status: DC | PRN
  Administered 2018-02-26: 4 mg via INTRAVENOUS

## 2018-02-26 MED ORDER — SODIUM CHLORIDE 0.9 % IJ SOLN
INTRAMUSCULAR | Status: AC
  Filled 2018-02-26: qty 100

## 2018-02-26 MED ORDER — MIDAZOLAM HCL 2 MG/2ML IJ SOLN
INTRAMUSCULAR | Status: AC
  Filled 2018-02-26: qty 2

## 2018-02-26 SURGICAL SUPPLY — 31 items
APL SWBSTK 6 STRL LF DISP (MISCELLANEOUS) ×2
APPLICATOR COTTON TIP 6 STRL (MISCELLANEOUS) ×1 IMPLANT
APPLICATOR COTTON TIP 6IN STRL (MISCELLANEOUS) ×6
CATH ROBINSON RED A/P 16FR (CATHETERS) ×3 IMPLANT
CLEANER TIP ELECTROSURG 2X2 (MISCELLANEOUS) ×2 IMPLANT
ELECT BALL LEEP 5MM RED (ELECTRODE) ×2 IMPLANT
ELECT LOOP LEEP RND 15X12 GRN (CUTTING LOOP) ×3
ELECT LOOP LEEP RND 20X12 WHT (CUTTING LOOP)
ELECT LOOP LEEP SQR 10X10 ORG (CUTTING LOOP)
ELECT REM PT RETURN 9FT ADLT (ELECTROSURGICAL) ×3
ELECTRODE LOOP LP RND 15X12GRN (CUTTING LOOP) IMPLANT
ELECTRODE LOOP LP RND 20X12WHT (CUTTING LOOP) IMPLANT
ELECTRODE LOOP LP SQR 10X10ORG (CUTTING LOOP) IMPLANT
ELECTRODE REM PT RTRN 9FT ADLT (ELECTROSURGICAL) ×1 IMPLANT
EXTENDER ELECT LOOP LEEP 10CM (CUTTING LOOP) IMPLANT
GLOVE BIO SURGEON STRL SZ7.5 (GLOVE) ×3 IMPLANT
GLOVE BIOGEL PI IND STRL 7.0 (GLOVE) ×1 IMPLANT
GLOVE BIOGEL PI IND STRL 7.5 (GLOVE) ×1 IMPLANT
GLOVE BIOGEL PI INDICATOR 7.0 (GLOVE) ×2
GLOVE BIOGEL PI INDICATOR 7.5 (GLOVE) ×2
GOWN STRL REUS W/TWL LRG LVL3 (GOWN DISPOSABLE) ×6 IMPLANT
NS IRRIG 1000ML POUR BTL (IV SOLUTION) ×3 IMPLANT
PACK VAGINAL MINOR WOMEN LF (CUSTOM PROCEDURE TRAY) ×3 IMPLANT
PAD OB MATERNITY 4.3X12.25 (PERSONAL CARE ITEMS) ×3 IMPLANT
PENCIL SMOKE EVAC W/HOLSTER (ELECTROSURGICAL) ×3 IMPLANT
SCOPETTES 8  STERILE (MISCELLANEOUS) ×4
SCOPETTES 8 STERILE (MISCELLANEOUS) ×2 IMPLANT
SPONGE SURGIFOAM ABS GEL 12-7 (HEMOSTASIS) IMPLANT
SUT VIC AB 0 CT1 27 (SUTURE) ×3
SUT VIC AB 0 CT1 27XBRD ANBCTR (SUTURE) IMPLANT
TOWEL OR 17X24 6PK STRL BLUE (TOWEL DISPOSABLE) ×6 IMPLANT

## 2018-02-26 NOTE — Anesthesia Postprocedure Evaluation (Signed)
Anesthesia Post Note  Patient: Edwinna Areola  Procedure(s) Performed: LOOP ELECTROSURGICAL EXCISION PROCEDURE (LEEP) (N/A Vagina )     Patient location during evaluation: PACU Anesthesia Type: General Level of consciousness: awake and alert Pain management: pain level controlled Vital Signs Assessment: post-procedure vital signs reviewed and stable Respiratory status: spontaneous breathing, nonlabored ventilation, respiratory function stable and patient connected to nasal cannula oxygen Cardiovascular status: blood pressure returned to baseline and stable Postop Assessment: no apparent nausea or vomiting Anesthetic complications: no    Last Vitals:  Vitals:   02/26/18 1330 02/26/18 1343  BP: 125/73 132/71  Pulse: (!) 104 97  Resp: 18 17  Temp:    SpO2: 94% 95%    Last Pain:  Vitals:   02/26/18 1343  TempSrc:   PainSc: 3    Pain Goal: Patients Stated Pain Goal: 3 (02/26/18 1343)               Effie Berkshire

## 2018-02-26 NOTE — Anesthesia Preprocedure Evaluation (Addendum)
Anesthesia Evaluation  Patient identified by MRN, date of birth, ID band Patient awake    Reviewed: Allergy & Precautions, NPO status , Patient's Chart, lab work & pertinent test results  Airway Mallampati: III  TM Distance: >3 FB Neck ROM: Full    Dental  (+) Teeth Intact, Dental Advisory Given   Pulmonary former smoker,    breath sounds clear to auscultation       Cardiovascular negative cardio ROS   Rhythm:Regular Rate:Normal     Neuro/Psych  Headaches, Anxiety Depression    GI/Hepatic Neg liver ROS, GERD  ,  Endo/Other  negative endocrine ROS  Renal/GU negative Renal ROS     Musculoskeletal negative musculoskeletal ROS (+)   Abdominal (+) + obese,   Peds  Hematology   Anesthesia Other Findings - Sarcoidosis (Causes atypical chest pain, non cardiac/activity related per patient)  Reproductive/Obstetrics negative OB ROS                            Lab Results  Component Value Date   WBC 9.5 02/19/2018   HGB 12.9 02/19/2018   HCT 39.1 02/19/2018   MCV 83.2 02/19/2018   PLT 325 02/19/2018   Lab Results  Component Value Date   CREATININE 0.79 02/19/2018   BUN 14 02/19/2018   NA 135 02/19/2018   K 4.1 02/19/2018   CL 102 02/19/2018   CO2 23 02/19/2018   Lab Results  Component Value Date   INR 0.97 12/04/2017   EKG: sinus tachycardia.  Anesthesia Physical Anesthesia Plan  ASA: III  Anesthesia Plan: General   Post-op Pain Management:    Induction: Intravenous  PONV Risk Score and Plan: 4 or greater and Ondansetron, Dexamethasone, Midazolam and Scopolamine patch - Pre-op  Airway Management Planned: LMA  Additional Equipment: None  Intra-op Plan:   Post-operative Plan: Extubation in OR  Informed Consent: I have reviewed the patients History and Physical, chart, labs and discussed the procedure including the risks, benefits and alternatives for the proposed  anesthesia with the patient or authorized representative who has indicated his/her understanding and acceptance.   Dental advisory given  Plan Discussed with: CRNA  Anesthesia Plan Comments: (Currently taking 20mg  Prednisone PO, Will administer 3mg  IV Dexamethasone for coverage)       Anesthesia Quick Evaluation

## 2018-02-26 NOTE — Discharge Instructions (Signed)

## 2018-02-26 NOTE — Interval H&P Note (Signed)
History and Physical Interval Note:  02/26/2018 11:56 AM  Yvonne Banks  has presented today for surgery, with the diagnosis of CIN I  The various methods of treatment have been discussed with the patient and family. After consideration of risks, benefits and other options for treatment, the patient has consented to  Procedure(s): LOOP ELECTROSURGICAL EXCISION PROCEDURE (LEEP) (N/A) as a surgical intervention .  The patient's history has been reviewed, patient examined, no change in status, stable for surgery.  I have reviewed the patient's chart and labs.  Questions were answered to the patient's satisfaction.     [name]

## 2018-02-26 NOTE — Anesthesia Procedure Notes (Signed)
Procedure Name: LMA Insertion Date/Time: 02/26/2018 12:15 PM Performed by: Jonna Munro, CRNA Pre-anesthesia Checklist: Patient identified, Emergency Drugs available, Suction available, Patient being monitored and Timeout performed Patient Re-evaluated:Patient Re-evaluated prior to induction Oxygen Delivery Method: Circle system utilized Preoxygenation: Pre-oxygenation with 100% oxygen Induction Type: IV induction LMA: LMA with gastric port inserted LMA Size: 4.0 Number of attempts: 1 Placement Confirmation: positive ETCO2 and breath sounds checked- equal and bilateral Dental Injury: Teeth and Oropharynx as per pre-operative assessment

## 2018-02-26 NOTE — Transfer of Care (Signed)
Immediate Anesthesia Transfer of Care Note  Patient: Yvonne Banks  Procedure(s) Performed: LOOP ELECTROSURGICAL EXCISION PROCEDURE (LEEP) (N/A Vagina )  Patient Location: PACU  Anesthesia Type:General  Level of Consciousness: awake, alert  and oriented  Airway & Oxygen Therapy: Patient Spontanous Breathing and Patient connected to nasal cannula oxygen  Post-op Assessment: Report given to RN and Post -op Vital signs reviewed and stable  Post vital signs: Reviewed and stable  Last Vitals:  Vitals Value Taken Time  BP    Temp    Pulse 111 02/26/2018 12:57 PM  Resp    SpO2 91 % 02/26/2018 12:57 PM  Vitals shown include unvalidated device data.  Last Pain:  Vitals:   02/26/18 0951  TempSrc: Oral  PainSc: 0-No pain      Patients Stated Pain Goal: 3 (60/73/71 0626)  Complications: No apparent anesthesia complications

## 2018-02-26 NOTE — Op Note (Signed)
Preop Diagnosis: CIN I   Postop Diagnosis: CIN I   Procedure: LOOP ELECTROSURGICAL EXCISION PROCEDURE (LEEP)   Anesthesia: General   Anesthesiologist: Effie Berkshire, MD   Attending: Everett Graff, MD   Assistant: None  Findings: N/a  Pathology: Cervical LEEP specimen  Fluids: 1200 cc  UOP: 25 cc  EBL: 5 cc  Complications: None  Procedure:The patient was taken to the operating room after the risks, benefits and alternatives were discussed with the patient. The patient verbalized understanding and consent signed and witnessed. The patient was placed under general anesthesia per anesthesiologist and prepped and draped in the normal sterile fashion.  A TIME OUT was performed per protocol.  A weighted speculum was placed in the patient's vagina and the anterior lip of the cervix was grasped with a single tooth tenaculum.  LEEP was performed without difficulty and the bed of the cervix was cauterized with the ball tip cautery.  The bed of the cervix continued to bleed.  Dilute pitressin was injected and monsels solution applied.  Hemostasis was achieved.  A stitch was placed at 12:00 and specimen sent to pathology.  All instruments were removed. Sponge lap and needle count was correct. Patient tolerated the procedure well and was awaiting transfer to the recovery room in good condition.

## 2018-02-26 NOTE — H&P (Signed)
Yvonne Banks is an 47 y.o. female. Pt is presenting for LEEP d/t persistent abnormal paps with LGSIL and CIN 1 with discrepancy on the last bx.  Menstrual History:  Patient's last menstrual period was 06/04/2017 (approximate).    Past Medical History:  Diagnosis Date  . Allergy   . Anemia menometrorrhaghia   history  . Anxiety    no meds  . Bilateral knee pain    no current prob - only bothers her when it rains  . Complication of anesthesia    woke up coughing after surgery May 2014 - required inhaler in recovery and for a week after that surgery--pt thinks it was Symbicort  . Depression   . Fibroids   . Gallstones    causing nausea and and midchest -abdominal pain  . GERD (gastroesophageal reflux disease)    History - resolved treatment with diet - no meds  . Herpes genitalis in women   . History of chlamydia   . IBS (irritable bowel syndrome)    diet controlled - no meds, no current prob  . Insomnia   . Migraines    topamax  . MRSA (methicillin resistant staph aureus) culture positive    skin infection7/2013   PT STATES NO INFECTION AT PRESENT AND SHE IS FOLLOWING HER DOCTOR'S ORDERS TO USE MUPIROCIN  OINTMENT BID IN NOSE FIRST 5 DAYS OF EACH MONTH THRU DEC 2014 AND HAS HIBICLENS TO [place] THE AREAS WHERE THE INFECTION HAS BEEN.  . PMS (premenstrual syndrome)   . Sarcoidosis    recent dx 11/2017- was hospitalized-tx w/ prednisone  . Shortness of breath    with exertion    . Thyromegaly   . Vitamin D deficiency     Past Surgical History:  Procedure Laterality Date  . BLADDER SURGERY     as a child urethea stretched  . CHOLECYSTECTOMY N/A 05/19/2013   Procedure: LAPAROSCOPIC CHOLECYSTECTOMY WITH INTRAOPERATIVE CHOLANGIOGRAM;  Surgeon: Madilyn Hook, DO;  Location: [facility];  Service: General;  Laterality: N/A;  . DILATATION & CURRETTAGE/HYSTEROSCOPY WITH RESECTOCOPE N/A 11/05/2012   Procedure: [place];  Surgeon: [name], MD;  Location: [facility];  Service: Gynecology;  Laterality: N/A;  . DILATION AND CURETTAGE OF UTERUS  11/2013  . IUD REMOVAL     skyla removed 02/10/14 in doctor's office  . MYOMECTOMY N/A 02/17/2014   Procedure: Exploratory Laparotomy MYOMECTOMY;  Surgeon: Marvene Staff, MD;  Location: [facility];  Service: Gynecology;  Laterality: N/A;  . skyla iud  01-14-13  . VIDEO BRONCHOSCOPY Bilateral 12/04/2017   Procedure: VIDEO BRONCHOSCOPY WITH FLUORO;  Surgeon: Chesley Mires, MD;  Location: [facility];  Service: Endoscopy;  Laterality: Bilateral;  . WISDOM TOOTH EXTRACTION      Family History  Problem Relation Age of Onset  . Anemia Mother   . Hypertension Mother   . Asthma Mother   . Allergies Mother   . Ulcers Sister   . Cancer Maternal Grandmother        colon  . Asthma Maternal Aunt   . Allergies Maternal Aunt   . Allergies Sister   . Breast cancer Paternal Aunt   . Lung cancer Cousin     Social History:  reports that she quit smoking about 17 years ago. Her smoking use included cigarettes. She has a 15.00 pack-year smoking history. She has never used smokeless tobacco. She reports that she drinks alcohol. She reports that she does not use drugs.  Allergies:  Allergies  Allergen Reactions  . Other Anaphylaxis    Spicy peppers - anything hot or spicy causes swelling and skin inflammation Cilantro- throat closes, trouble breathing  . Pineapple Anaphylaxis    Throat closes, tongue swells  . Adhesive [Tape] Other (See Comments)    Causes skin discoloration.  . Erythromycin Nausea And Vomiting  . Iron     Stomach pain. Pt can take Integra for iron  . Latex Other (See Comments)    Discolors skin    Medications Prior to Admission  Medication Sig Dispense Refill Last Dose  . amitriptyline (ELAVIL) 25 MG tablet Take 25 mg by mouth at bedtime.   02/25/2018 at Unknown time  . cetirizine (ZYRTEC) 10 MG tablet Take 10 mg by mouth at bedtime.    Past Month at Unknown time  .  Dapsone 5 % topical gel Apply 1 application topically at bedtime.    Past Week at Unknown time  . fluticasone (FLONASE) 50 MCG/ACT nasal spray Place 2 sprays into both nostrils daily.   02/26/2018 at 0800  . montelukast (SINGULAIR) 10 MG tablet Take 10 mg by mouth at bedtime.   02/25/2018 at Unknown time  . omeprazole (PRILOSEC) 20 MG capsule Take 1 capsule (20 mg) by mouth daily in the morning and empty stomach with a glass of water 30 capsule 4 02/26/2018 at 0800  . PARoxetine (PAXIL) 10 MG tablet Take 10 mg by mouth at bedtime.   02/25/2018 at Unknown time  . predniSONE (DELTASONE) 10 MG tablet Take 10 mg by mouth daily with breakfast.   02/25/2018 at 0930  . rizatriptan (MAXALT) 10 MG tablet Take 10 mg by mouth as needed for migraine.    Past Month at Unknown time  . spironolactone (ALDACTONE) 25 MG tablet Take 25 mg by mouth 2 (two) times daily.   1 02/25/2018 at Unknown time  . traMADol (ULTRAM) 50 MG tablet Take 1 tablet (50 mg total) by mouth every 6 (six) hours as needed. (Patient taking differently: Take 50 mg by mouth every 6 (six) hours as needed for moderate pain. ) 30 tablet 0 Past Week at Unknown time  . triamcinolone cream (KENALOG) 0.1 % APPLY TO AFFECTED AREA AS DIRECTED FOR ITCHY RASH AS NEEDED  1 Past Week at Unknown time  . promethazine (PHENERGAN) 25 MG tablet Take 25 mg by mouth every 6 (six) hours as needed for nausea or vomiting.   More than a month at Unknown time  . tiZANidine (ZANAFLEX) 4 MG tablet Take 2 mg by mouth every 8 (eight) hours as needed for migraine.  5 More than a month at Unknown time    ROS Denies F/C/N/V/D  Blood pressure 116/76, pulse 94, temperature 98.1 F (36.7 C), temperature source Oral, resp. rate 18, last menstrual period 06/04/2017, SpO2 98 %. Physical Exam Lungs CTA CV RRR Abd soft, NT Ext no calf tenderness  Results for orders placed or performed during the hospital encounter of 02/26/18 (from the past 24 hour(s))  Pregnancy, urine     Status: None    Collection Time: 02/26/18 10:00 AM  Result Value Ref Range   Preg Test, Ur NEGATIVE NEGATIVE    No results found.  Assessment/Plan: Pt presenting for LEEP.  R/B/A discussed including but not limited to bleeding infection and injury.  Pt verbalized understanding and consent s/w.  [name] 02/26/2018, 11:50 AM

## 2018-02-27 ENCOUNTER — Encounter (HOSPITAL_COMMUNITY): Payer: Self-pay | Admitting: Obstetrics and Gynecology

## 2018-03-01 ENCOUNTER — Ambulatory Visit (INDEPENDENT_AMBULATORY_CARE_PROVIDER_SITE_OTHER)
Admission: RE | Admit: 2018-03-01 | Discharge: 2018-03-01 | Disposition: A | Discharge status: Home / Self Care | Payer: 59 | Source: Ambulatory Visit | Attending: Pulmonary Disease | Admitting: Pulmonary Disease

## 2018-03-01 DIAGNOSIS — D869 Sarcoidosis, unspecified: Secondary | ICD-10-CM

## 2018-03-01 MED ORDER — IOPAMIDOL (ISOVUE-300) INJECTION 61%
80.0000 mL | Freq: Once | INTRAVENOUS | Status: AC | PRN
  Administered 2018-03-01: 80 mL via INTRAVENOUS

## 2018-03-03 ENCOUNTER — Ambulatory Visit (INDEPENDENT_AMBULATORY_CARE_PROVIDER_SITE_OTHER): Payer: 59 | Admitting: Pulmonary Disease

## 2018-03-03 ENCOUNTER — Encounter: Payer: Self-pay | Admitting: Pulmonary Disease

## 2018-03-03 VITALS — BP 124/68 | HR 101 | Ht 65.0 in | Wt 283.8 lb

## 2018-03-03 DIAGNOSIS — D869 Sarcoidosis, unspecified: Secondary | ICD-10-CM

## 2018-03-03 DIAGNOSIS — M549 Dorsalgia, unspecified: Secondary | ICD-10-CM

## 2018-03-03 DIAGNOSIS — R0602 Shortness of breath: Secondary | ICD-10-CM | POA: Diagnosis not present

## 2018-03-03 MED ORDER — BUDESONIDE-FORMOTEROL FUMARATE 160-4.5 MCG/ACT IN AERO: 2.0000 | INHALATION_SPRAY | Freq: Two times a day (BID) | RESPIRATORY_TRACT | 6 refills | Status: DC

## 2018-03-03 MED ORDER — BUDESONIDE-FORMOTEROL FUMARATE 160-4.5 MCG/ACT IN AERO: 2.0000 | INHALATION_SPRAY | Freq: Two times a day (BID) | RESPIRATORY_TRACT | 0 refills | Status: DC

## 2018-03-03 NOTE — Progress Notes (Signed)
[facility]  Chief Complaint  Patient presents with  . Follow-up    chronic dry cough, fatigue, weight gain, dizziness, SOB, trouble walking.     Constitutional: BP 124/68 (BP Location: Right Arm, Cuff Size: Normal)   Pulse (!) 101   Ht 5\' 5"  (1.651 m)   Wt 283 lb 12.8 oz (128.7 kg)   SpO2 98%   BMI 47.23 kg/m   History of Present Illness: Yvonne Banks is a 47 y.o. female former smoker with sarcoidosis.  She was feeling better when she was on higher dose of prednisone.  Since decreasing to 10 mg prednisone daily she is feeling more short of breath with exertion.  Also having dry cough and occasional wheeze.  Not having fever, or hemoptysis.  Has discomfort in her right chest.  Has been getting neck pain with muscle spasm, and makes it difficult for her to move.  She is having trouble keeping up with work due to back issues and shortness of breath.  Had LEEP with Gyn last week, and found to have bacterial vaginosis.  Also has acne from prednisone.  Started on metronidazole.  Mother concerned she is snoring since gaining weight.  Comprehensive Respiratory Exam:  Appearance - moon facies ENMT - nasal mucosa moist, turbinates clear, midline nasal septum, no dental lesions, no gingival bleeding, no oral exudates, enlarged tongue, MP 4 Neck - no masses, trachea midline, no thyromegaly, unable to appreciate JVP due to body habitus Respiratory - normal appearance of chest wall, normal respiratory effort w/o accessory muscle use, no dullness on percussion, no wheezing or rales CV - s1s2 regular rate and rhythm, no murmurs, no peripheral edema, radial pulses symmetric GI - soft, non tender, no masses, no hepatosplenomegaly Lymph - no adenopathy noted in neck and axillary areas MSK - normal muscle strength and tone, normal gait Ext - no cyanosis, clubbing, or joint inflammation noted Skin - changes of acne on face and upper trunk area Neuro -  oriented to person, place, and time Psych - normal mood and affect   Assessment/Plan:  Sarcoidosis. - she has significant improvement in radiographic appearance - had worsening symptoms with reduction in prednisone dose - will have her increase prednisone to 30 mg daily for 2 weeks, then 20 mg daily until next visit - add symbicort - if unable to wean steroids further, then might need to consider adding steroid sparing agent such as MTX - advised her to f/u with eye doctor for monitoring  Snoring. - she is at risk for sleep apnea - advised her to monitor her sleep pattern  Neck pain with muscle spasm. - seems musculoskeletal and don't think this is related to sarcoidosis - advised her to f/u with her PCP  Bacterial vaginosis. - f/u with Gyn  Acne from steroid use. - f/u with dermatology   Patient Instructions  Prednisone 30 mg daily for 2 weeks, then 20 mg daily until next visit  Symbicort two puffs twice per day, and rinse mouth after each use  Follow up in 4 weeks with Dr. Beckey Rutter, MD [place] 03/03/2018, 10:10 AM  Flow Sheet  Pulmonary tests: CT angio chest 12/03/17 >> borderline LAN, mass like consolidation in Rt upper and lower lobes Bronchoscopy 12/04/17 >> negative for malignancy ACE 12/08/17 >> 84 Quatiferon gold 12/08/17 >> negative Cryptococcal Ag 01/06/18 >> positive, titer 1:80 PFT >> FEV1 2.11 (82%), FEV1% 88, TLC 4.27 (80%), DLCO 63% CT  chest 03/01/18 >> decreased size of apical opacities  Past Medical History: She  has a past medical history of Allergy, Anemia (menometrorrhaghia), Anxiety, Bilateral knee pain, Complication of anesthesia, Depression, Fibroids, Gallstones, GERD (gastroesophageal reflux disease), Herpes genitalis in women, History of chlamydia, IBS (irritable bowel syndrome), Insomnia, Migraines, MRSA (methicillin resistant staph aureus) culture positive, PMS (premenstrual syndrome), Sarcoidosis, Shortness  of breath, Thyromegaly, and Vitamin D deficiency.  Past Surgical History: She  has a past surgical history that includes Wisdom tooth extraction; Dilatation & currettage/hysteroscopy with resectoscope (N/A, 11/05/2012); skyla iud (01-14-13); Cholecystectomy (N/A, 05/19/2013); IUD removal; Bladder surgery; Dilation and curettage of uterus (11/2013); Myomectomy (N/A, 02/17/2014); Video bronchoscopy (Bilateral, 12/04/2017); and LEEP (N/A, 02/26/2018).  Family History: Her family history includes Allergies in her maternal aunt, mother, and sister; Anemia in her mother; Asthma in her maternal aunt and mother; Breast cancer in her paternal aunt; Cancer in her maternal grandmother; Hypertension in her mother; Lung cancer in her cousin; Ulcers in her sister.  Social History: She  reports that she quit smoking about 17 years ago. Her smoking use included cigarettes. She has a 15.00 pack-year smoking history. She has never used smokeless tobacco. She reports that she drinks alcohol. She reports that she does not use drugs.  Medications: Allergies as of 03/03/2018      Reactions   Other Anaphylaxis   Spicy peppers - anything hot or spicy causes swelling and skin inflammation Cilantro- throat closes, trouble breathing   Pineapple Anaphylaxis   Throat closes, tongue swells   Adhesive [tape] Other (See Comments)   Causes skin discoloration.   Erythromycin Nausea And Vomiting   Iron    Stomach pain. Pt can take Integra for iron   Latex Other (See Comments)   Discolors skin      Medication List        Accurate as of 03/03/18 10:10 AM. Always use your most recent med list.          amitriptyline 25 MG tablet Commonly known as:  ELAVIL Take 25 mg by mouth at bedtime.   budesonide-formoterol 160-4.5 MCG/ACT inhaler Commonly known as:  SYMBICORT Inhale 2 puffs into the lungs 2 (two) times daily.   cetirizine 10 MG tablet Commonly known as:  ZYRTEC Take 10 mg by mouth at bedtime.   cyclobenzaprine  10 MG tablet Commonly known as:  FLEXERIL Take 10 mg by mouth 3 (three) times daily.   Dapsone 5 % topical gel Apply 1 application topically at bedtime.   fluticasone 50 MCG/ACT nasal spray Commonly known as:  FLONASE Place 2 sprays into both nostrils daily.   ibuprofen 600 MG tablet Commonly known as:  ADVIL,MOTRIN Take 1 tablet (600 mg total) by mouth every 6 (six) hours as needed.   metroNIDAZOLE 250 MG tablet Commonly known as:  FLAGYL Take 250 mg by mouth 2 (two) times daily.   montelukast 10 MG tablet Commonly known as:  SINGULAIR Take 10 mg by mouth at bedtime.   omeprazole 20 MG capsule Commonly known as:  PRILOSEC Take 1 capsule (20 mg) by mouth daily in the morning and empty stomach with a glass of water   oxyCODONE-acetaminophen 5-325 MG tablet Commonly known as:  PERCOCET/ROXICET Take 1-2 tablets by mouth every 6 (six) hours as needed for severe pain.   PARoxetine 10 MG tablet Commonly known as:  PAXIL Take 10 mg by mouth at bedtime.   predniSONE 10 MG tablet Commonly known as:  DELTASONE Take 10 mg by mouth daily  with breakfast.   promethazine 25 MG tablet Commonly known as:  PHENERGAN Take 25 mg by mouth every 6 (six) hours as needed for nausea or vomiting.   rizatriptan 10 MG tablet Commonly known as:  MAXALT Take 10 mg by mouth as needed for migraine.   tiZANidine 4 MG tablet Commonly known as:  ZANAFLEX Take 2 mg by mouth every 8 (eight) hours as needed for migraine.   triamcinolone cream 0.1 % Commonly known as:  KENALOG APPLY TO AFFECTED AREA AS DIRECTED FOR ITCHY RASH AS NEEDED

## 2018-03-03 NOTE — Patient Instructions (Signed)
Prednisone 30 mg daily for 2 weeks, then 20 mg daily until next visit  Symbicort two puffs twice per day, and rinse mouth after each use  Follow up in 4 weeks with Dr. Halford Chessman

## 2018-03-17 ENCOUNTER — Telehealth: Payer: Self-pay | Admitting: Pulmonary Disease

## 2018-03-17 NOTE — Telephone Encounter (Signed)
Forms have been placed in Dr. Juanetta Gosling lookout.

## 2018-03-18 NOTE — Telephone Encounter (Signed)
Fwd to [employer] via interoffice mail - 03/18/18-pr

## 2018-03-18 NOTE — Telephone Encounter (Signed)
Will route message to [facility] for follow up.

## 2018-03-18 NOTE — Telephone Encounter (Signed)
Form completed and in my office. 

## 2018-03-18 NOTE — Telephone Encounter (Signed)
The FMLA forms are in a green folder placed in VS green look at in his cubby  Once the folder is completed and sign, please advise. Routing message to VS

## 2018-03-18 NOTE — Telephone Encounter (Signed)
rec'd the green folder of the disability forms for patient gave it to [place] in front office The folder needs to go back to [facility]. Nothing further needed at this time.

## 2018-03-25 ENCOUNTER — Telehealth: Payer: Self-pay | Admitting: Pulmonary Disease

## 2018-03-25 NOTE — Telephone Encounter (Signed)
Called and spoke with patient regarding her FMLA paperwork documents She advised [employer] is resending over the folder this week, it will need to be corrected Pt had placed wrong date on the forms, and will need to redone Advised patient I will look out for the folder to further review. Pt is requesting VS to approve her to be out of work 3-4 times per month for her condition

## 2018-03-29 ENCOUNTER — Telehealth: Payer: Self-pay | Admitting: Pulmonary Disease

## 2018-03-29 NOTE — Telephone Encounter (Signed)
Per Dr. Halford Chessman-   Should be okay.     Called and spoke with Patient.  Taking Prednisone and getting Dexamethasone injection, with lidocaine should be OK per Dr. Halford Chessman. Understanding stated.  Nothing further at this time.

## 2018-03-29 NOTE — Telephone Encounter (Signed)
Patient is schedule for a Dexamethasone injection and lidocaine with [facility] tomorrow 03/30/18 at 0900, for back pain.  She stated that she wanted to make sure that injection would be ok with taking 20mg  on Prednisone at this time.  Dr. Blossom Hoops advise

## 2018-03-29 NOTE — Telephone Encounter (Signed)
Should be okay 

## 2018-03-31 NOTE — Telephone Encounter (Signed)
Rec'd FMLA forms via interoffice mail. Fwd to [facility] for VS to complete -pr

## 2018-04-01 NOTE — Telephone Encounter (Signed)
rec'd folder for pt needing the forms to say 1-2 days mo for intermittent leave Placed folder on VS desk for review

## 2018-04-05 ENCOUNTER — Encounter: Payer: Self-pay | Admitting: Pulmonary Disease

## 2018-04-05 ENCOUNTER — Ambulatory Visit (INDEPENDENT_AMBULATORY_CARE_PROVIDER_SITE_OTHER): Payer: 59 | Admitting: Pulmonary Disease

## 2018-04-05 VITALS — BP 134/84 | HR 117 | Ht 65.0 in | Wt 291.8 lb

## 2018-04-05 DIAGNOSIS — Z23 Encounter for immunization: Secondary | ICD-10-CM

## 2018-04-05 DIAGNOSIS — D869 Sarcoidosis, unspecified: Secondary | ICD-10-CM

## 2018-04-05 DIAGNOSIS — R5381 Other malaise: Secondary | ICD-10-CM

## 2018-04-05 DIAGNOSIS — Z6841 Body Mass Index (BMI) 40.0 and over, adult: Secondary | ICD-10-CM

## 2018-04-05 DIAGNOSIS — R0602 Shortness of breath: Secondary | ICD-10-CM

## 2018-04-05 MED ORDER — PREDNISONE 10 MG PO TABS: ORAL_TABLET | ORAL | 0 refills | Status: DC

## 2018-04-05 NOTE — Progress Notes (Signed)
[facility]  Chief Complaint  Patient presents with  . Follow-up    Pt has had more labored breathing, and SOB with exertion in last few weeks. Pt has had lsome dizzy spells in last few weeks, and more fatigued than normal.  Pt is having chest pain on left side on and off. Pt has had severe pain in lower back since Aug 2019.     Constitutional: BP 134/84 (BP Location: Left Arm, Cuff Size: Normal)   Pulse (!) 117   Ht 5\' 5"  (1.651 m)   Wt 291 lb 12.8 oz (132.4 kg)   SpO2 98%   BMI 48.56 kg/m   History of Present Illness: Yvonne Banks is a 47 y.o. female former smoker with sarcoidosis.  She did not notice any improvement with increase in dose of prednisone.  In fact her breathing has gotten some worse.  She has gained about 35 lbs since starting prednisone.  She gets winded with activity and then gets pain in her back.  Her muscles go into spasm and then she has trouble moving.    She isn't having wheeze, sputum, fever, or hemoptysis.  Gets intermittent pressure feeling in her chest and sides.  She did visit eye doctor, and no issues.  Comprehensive Respiratory Exam:  Appearance - moon facies ENMT - nasal mucosa moist, turbinates clear, midline nasal septum, no dental lesions, no gingival bleeding, no oral exudates, no tonsillar hypertrophy, MP 4 Neck - no masses, trachea midline, no thyromegaly, no elevation in JVP Respiratory - normal appearance of chest wall, normal respiratory effort w/o accessory muscle use, no dullness on percussion, no wheezing or rales CV - s1s2 regular rate and rhythm, no murmurs, no peripheral edema, radial pulses symmetric GI - soft, non tender, no masses Lymph - no adenopathy noted in neck and axillary areas MSK - normal muscle strength and tone, walks with a cane Ext - no cyanosis, clubbing, or joint inflammation noted Skin - no rashes, lesions, or ulcers Neuro - oriented to person, place, and time Psych  - normal mood and affect   ECG 04/05/18 - sinus rhythm   Discussion: She has persistent dyspnea on exertion.  She did not have improvement with augmentation to her sarcoid therapy.  I am more concerned should could be having dyspnea from weight gain and deconditioning.  Assessment/Plan:  Dyspnea on exertion. - will arrange for Echo - would benefit from weight loss regimen  Sarcoidosis. - will gradually taper dose of prednisone - continue symbicort  Upper airway cough. - continue flonase, singulair  Snoring. - monitor sleep pattern  Neck pain with muscle spasm. - follow up with orthopedics - probably would benefit from physical therapy  Acne from steroid use. - f/u with dermatology   Patient Instructions  Prednisone 10 mg pill >> 1.5 pills daily for 2 weeks, then 1 pill daily for 2 weeks, then 1/2 pill daily. Will schedule echocardiogram. Flu shot and prevnar shot today  Follow up in 6 weeks    Chesley Mires, MD [place] 04/05/2018, 5:34 PM  Flow Sheet  Pulmonary tests: CT angio chest 12/03/17 >> borderline LAN, mass like consolidation in Rt upper and lower lobes Bronchoscopy 12/04/17 >> negative for malignancy ACE 12/08/17 >> 84 Quatiferon gold 12/08/17 >> negative Cryptococcal Ag 01/06/18 >> positive, titer 1:80 PFT 01/19/18 >> FEV1 2.11 (82%), FEV1% 88, TLC 4.27 (80%), DLCO 63% CT chest 03/01/18 >> decreased size of apical opacities  Past Medical History:  She  has a past medical history of Allergy, Anemia (menometrorrhaghia), Anxiety, Bilateral knee pain, Complication of anesthesia, Depression, Fibroids, Gallstones, GERD (gastroesophageal reflux disease), Herpes genitalis in women, History of chlamydia, IBS (irritable bowel syndrome), Insomnia, Migraines, MRSA (methicillin resistant staph aureus) culture positive, PMS (premenstrual syndrome), Sarcoidosis, Shortness of breath, Thyromegaly, and Vitamin D deficiency.  Past Surgical History: She   has a past surgical history that includes Wisdom tooth extraction; Dilatation & currettage/hysteroscopy with resectoscope (N/A, 11/05/2012); skyla iud (01-14-13); Cholecystectomy (N/A, 05/19/2013); IUD removal; Bladder surgery; Dilation and curettage of uterus (11/2013); Myomectomy (N/A, 02/17/2014); Video bronchoscopy (Bilateral, 12/04/2017); and LEEP (N/A, 02/26/2018).  Family History: Her family history includes Allergies in her maternal aunt, mother, and sister; Anemia in her mother; Asthma in her maternal aunt and mother; Breast cancer in her paternal aunt; Cancer in her maternal grandmother; Hypertension in her mother; Lung cancer in her cousin; Ulcers in her sister.  Social History: She  reports that she quit smoking about 17 years ago. Her smoking use included cigarettes. She has a 15.00 pack-year smoking history. She has never used smokeless tobacco. She reports that she drinks alcohol. She reports that she does not use drugs.  Medications: Allergies as of 04/05/2018      Reactions   Other Anaphylaxis   Spicy peppers - anything hot or spicy causes swelling and skin inflammation Cilantro- throat closes, trouble breathing   Pineapple Anaphylaxis   Throat closes, tongue swells   Adhesive [tape] Other (See Comments)   Causes skin discoloration.   Erythromycin Nausea And Vomiting   Iron    Stomach pain. Pt can take Integra for iron   Latex Other (See Comments)   Discolors skin      Medication List        Accurate as of 04/05/18  5:34 PM. Always use your most recent med list.          amitriptyline 25 MG tablet Commonly known as:  ELAVIL Take 25 mg by mouth at bedtime.   budesonide-formoterol 160-4.5 MCG/ACT inhaler Commonly known as:  SYMBICORT Inhale 2 puffs into the lungs 2 (two) times daily.   budesonide-formoterol 160-4.5 MCG/ACT inhaler Commonly known as:  SYMBICORT Inhale 2 puffs into the lungs 2 (two) times daily.   cetirizine 10 MG tablet Commonly known as:   ZYRTEC Take 10 mg by mouth at bedtime.   CLARAVIS 40 MG capsule Generic drug:  ISOtretinoin Take 40 mg by mouth 2 (two) times daily.   Dapsone 5 % topical gel Apply 1 application topically at bedtime.   fluticasone 50 MCG/ACT nasal spray Commonly known as:  FLONASE Place 2 sprays into both nostrils daily.   montelukast 10 MG tablet Commonly known as:  SINGULAIR Take 10 mg by mouth at bedtime.   omeprazole 20 MG capsule Commonly known as:  PRILOSEC Take 1 capsule (20 mg) by mouth daily in the morning and empty stomach with a glass of water   PARoxetine 10 MG tablet Commonly known as:  PAXIL Take 10 mg by mouth at bedtime.   predniSONE 10 MG tablet Commonly known as:  DELTASONE 1.5 pills daily for 2 weeks, then 1 pill daily for 2 weeks, then 1/2 pill daily   promethazine 25 MG tablet Commonly known as:  PHENERGAN Take 25 mg by mouth every 6 (six) hours as needed for nausea or vomiting.   rizatriptan 10 MG tablet Commonly known as:  MAXALT Take 10 mg by mouth as needed for migraine.   tiZANidine 4  MG tablet Commonly known as:  ZANAFLEX Take 2 mg by mouth every 8 (eight) hours as needed for migraine.   triamcinolone cream 0.1 % Commonly known as:  KENALOG APPLY TO AFFECTED AREA AS DIRECTED FOR ITCHY RASH AS NEEDED

## 2018-04-05 NOTE — Patient Instructions (Addendum)
Prednisone 10 mg pill >> 1.5 pills daily for 2 weeks, then 1 pill daily for 2 weeks, then 1/2 pill daily. Will schedule echocardiogram. Flu shot and prevnar shot today  Follow up in 6 weeks

## 2018-04-06 DIAGNOSIS — Z23 Encounter for immunization: Secondary | ICD-10-CM

## 2018-04-06 NOTE — Telephone Encounter (Signed)
Claiborne Billings has this been taken care of? Please advise.

## 2018-04-06 NOTE — Addendum Note (Signed)
Addended by: Georjean Mode on: 04/06/2018 10:44 AM   Modules accepted: Orders

## 2018-04-06 NOTE — Telephone Encounter (Signed)
VS please advise once [employer] paperwork folder has been completed.

## 2018-04-07 NOTE — Telephone Encounter (Signed)
Have rec'd the folder from VS signed and ready for to go back to [facility]. Gave Patrice in front office the folder back for patient to be called. Nothing further needed at this time.

## 2018-04-07 NOTE — Telephone Encounter (Signed)
Yvonne Banks- do you have the forms or have they been sent back to [facility]? thanks

## 2018-04-07 NOTE — Telephone Encounter (Signed)
Form completed.

## 2018-04-08 ENCOUNTER — Other Ambulatory Visit: Payer: Self-pay

## 2018-04-08 ENCOUNTER — Ambulatory Visit (HOSPITAL_COMMUNITY): Payer: 59 | Attending: Cardiology

## 2018-04-08 DIAGNOSIS — R0602 Shortness of breath: Secondary | ICD-10-CM | POA: Insufficient documentation

## 2018-04-08 DIAGNOSIS — D869 Sarcoidosis, unspecified: Secondary | ICD-10-CM | POA: Insufficient documentation

## 2018-04-08 MED ORDER — PERFLUTREN LIPID MICROSPHERE
1.0000 mL | INTRAVENOUS | Status: AC | PRN
  Administered 2018-04-08: 1 mL via INTRAVENOUS

## 2018-04-08 NOTE — Telephone Encounter (Signed)
Rec'd completed forms back from [employer]. Fwd to [employer] via interoffice mail- pr

## 2018-04-09 ENCOUNTER — Telehealth: Payer: Self-pay | Admitting: Pulmonary Disease

## 2018-04-09 NOTE — Telephone Encounter (Signed)
Echo 04/08/18 >> EF 65 to 70%, grade 1 DD   Please let her know that the echocardiogram was normal.

## 2018-04-09 NOTE — Telephone Encounter (Signed)
Advised pt of results. Pt understood and nothing further is needed.   

## 2018-05-18 ENCOUNTER — Telehealth: Payer: Self-pay

## 2018-05-18 ENCOUNTER — Ambulatory Visit (INDEPENDENT_AMBULATORY_CARE_PROVIDER_SITE_OTHER): Payer: 59 | Admitting: Pulmonary Disease

## 2018-05-18 ENCOUNTER — Encounter: Payer: Self-pay | Admitting: Pulmonary Disease

## 2018-05-18 VITALS — BP 128/72 | HR 118 | Ht 65.0 in | Wt 302.0 lb

## 2018-05-18 DIAGNOSIS — D869 Sarcoidosis, unspecified: Secondary | ICD-10-CM

## 2018-05-18 DIAGNOSIS — Z6841 Body Mass Index (BMI) 40.0 and over, adult: Secondary | ICD-10-CM

## 2018-05-18 DIAGNOSIS — R05 Cough: Secondary | ICD-10-CM

## 2018-05-18 DIAGNOSIS — J984 Other disorders of lung: Secondary | ICD-10-CM | POA: Diagnosis not present

## 2018-05-18 DIAGNOSIS — R058 Other specified cough: Secondary | ICD-10-CM

## 2018-05-18 MED ORDER — PREDNISONE 5 MG PO TABS: ORAL_TABLET | ORAL | 0 refills | Status: DC

## 2018-05-18 NOTE — Telephone Encounter (Signed)
-----   Message from Chesley Mires, MD sent at 05/18/2018 12:30 PM EDT ----- Okay to schedule after 05/27/18.  V  ----- Message ----- From: Joellen Jersey Sent: 05/18/2018  11:36 AM EDT To: Chesley Mires, MD, Georjean Mode, CMA  This pt had a chest ct 02/18/18 and her precert is still valid insurance will not let me precert another on until after 05/27/18 how soon does this need to be done

## 2018-05-18 NOTE — Patient Instructions (Signed)
Will schedule high resolution CT chest and then discuss what plans are for prednisone  Follow up in 3 months

## 2018-05-18 NOTE — Progress Notes (Signed)
[facility]  Chief Complaint  Patient presents with  . Follow-up    Pt has a bad back, no moving around as much. Pt states not alot of issues nor concerns with SOB since last ov.    Constitutional:  BP 128/72 (BP Location: Left Arm, Cuff Size: Normal)   Pulse (!) 118   Ht 5\' 5"  (1.651 m)   Wt (!) 302 lb (137 kg)   SpO2 96%   BMI 50.26 kg/m   Past Medical History:  Anxiety, Anemia, Depression, GERD, Gallstones, IBS, Migraine HA, Vit D deficiency  Brief Summary:  Yvonne Banks is a 47 y.o. female former smoker with sarcoidosis.  She has gained 35 lbs since May 2019.  Acne has gotten better with decrease in prednisone and new medication from dermatology.  She is on 5 mg prednisone daily.  Has occasional cough and right sided chest discomfort.  Not having fever, sputum, hemoptysis, other skin rash, joint swelling, or gland swelling.  Main issue limiting her activity level is her back pain.  She uses a cane and is working with a Restaurant manager, fast food.  Has mild sinus congestion and post nasal drip.  Has been using flonase.  Doesn't feel like she needs singulair and hasn't used for a while.  She still uses symbicort and this helps.  Doesn't feel like sleep is an issue.  Physical Exam:   Appearance - moon facies  ENMT - clear nasal mucosa, midline nasal  septum, no oral exudates, no LAN, trachea midline  Respiratory - normal chest wall, normal respiratory effort, no accessory muscle use, no wheeze/rales  CV - s1s2 regular rate and rhythm, no murmurs, mild ankle edema, radial pulses symmetric  GI - soft, non tender, no masses  Lymph - no adenopathy noted in neck and axillary areas  MSK - uses a cane  Ext - no cyanosis, clubbing, or joint inflammation noted  Skin - no rashes, lesions, or ulcers  Neuro - normal strength, oriented x 3  Psych - normal mood and affect   Assessment/Plan:   Sarcoidosis. - repeat HRCT chest - if CT  chest stable or better, then continue to wean off prednisone over next 4 weeks  Reactive airway disease. - likely from asthma and sarcoid - continue symbicort  Upper airway cough. - continue flonase - can add singulair back in if symptoms get worse as she comes of prednisone  Snoring. - monitor sleep pattern  Obesity. - discussed options to assist with weight loss - exercise limited due to back pain - hopefully should get easier to maintain/lose weight once she is off prednisone   Patient Instructions  Will schedule high resolution CT chest and then discuss what plans are for prednisone  Follow up in 3 months  Time spent 26 minutes  Chesley Mires, MD [place] Pager: [phone] 05/18/2018, 9:53 AM  Flow Sheet     Pulmonary tests:  CT angio chest 12/03/17 >> borderline LAN, mass like consolidation in Rt upper and lower lobes Bronchoscopy 12/04/17 >> negative for malignancy ACE 12/08/17 >> 84 Quatiferon gold 12/08/17 >> negative Cryptococcal Ag 01/06/18 >> positive, titer 1:80 PFT 01/19/18 >> FEV1 2.11 (82%), FEV1% 88, TLC 4.27 (80%), DLCO 63% CT chest 03/01/18 >> decreased size of apical opacities   Cardiac tests:  Echo 04/08/18 >> EF 65 to 70%, grade 1 DD   Medications:   Allergies as of 05/18/2018      Reactions   Other Anaphylaxis  Spicy peppers - anything hot or spicy causes swelling and skin inflammation Cilantro- throat closes, trouble breathing   Pineapple Anaphylaxis   Throat closes, tongue swells   Adhesive [tape] Other (See Comments)   Causes skin discoloration.   Erythromycin Nausea And Vomiting   Iron    Stomach pain. Pt can take Integra for iron   Latex Other (See Comments)   Discolors skin      Medication List        Accurate as of 05/18/18  9:53 AM. Always use your most recent med list.          amitriptyline 25 MG tablet Commonly known as:  ELAVIL Take 25 mg by mouth at bedtime.   budesonide-formoterol  160-4.5 MCG/ACT inhaler Commonly known as:  SYMBICORT Inhale 2 puffs into the lungs 2 (two) times daily.   budesonide-formoterol 160-4.5 MCG/ACT inhaler Commonly known as:  SYMBICORT Inhale 2 puffs into the lungs 2 (two) times daily.   cetirizine 10 MG tablet Commonly known as:  ZYRTEC Take 10 mg by mouth at bedtime.   CLARAVIS 40 MG capsule Generic drug:  ISOtretinoin Take 40 mg by mouth 2 (two) times daily.   Dapsone 5 % topical gel Apply 1 application topically at bedtime.   fluticasone 50 MCG/ACT nasal spray Commonly known as:  FLONASE Place 2 sprays into both nostrils daily.   omeprazole 20 MG capsule Commonly known as:  PRILOSEC Take 1 capsule (20 mg) by mouth daily in the morning and empty stomach with a glass of water   PARoxetine 10 MG tablet Commonly known as:  PAXIL Take 10 mg by mouth at bedtime.   predniSONE 5 MG tablet Commonly known as:  DELTASONE 1 pill daily for 2 weeks, then 1/2 pill daily for 2 weeks   promethazine 25 MG tablet Commonly known as:  PHENERGAN Take 25 mg by mouth every 6 (six) hours as needed for nausea or vomiting.   rizatriptan 10 MG tablet Commonly known as:  MAXALT Take 10 mg by mouth as needed for migraine.   tiZANidine 4 MG tablet Commonly known as:  ZANAFLEX Take 2 mg by mouth every 8 (eight) hours as needed for migraine.   triamcinolone cream 0.1 % Commonly known as:  KENALOG APPLY TO AFFECTED AREA AS DIRECTED FOR ITCHY RASH AS NEEDED       Past Surgical History:  She  has a past surgical history that includes Wisdom tooth extraction; Dilatation & currettage/hysteroscopy with resectoscope (N/A, 11/05/2012); skyla iud (01-14-13); Cholecystectomy (N/A, 05/19/2013); IUD removal; Bladder surgery; Dilation and curettage of uterus (11/2013); Myomectomy (N/A, 02/17/2014); Video bronchoscopy (Bilateral, 12/04/2017); and LEEP (N/A, 02/26/2018).  Family History:  Her family history includes Allergies in her maternal aunt, mother, and  sister; Anemia in her mother; Asthma in her maternal aunt and mother; Breast cancer in her paternal aunt; Cancer in her maternal grandmother; Hypertension in her mother; Lung cancer in her cousin; Ulcers in her sister.  Social History:  She  reports that she quit smoking about 17 years ago. Her smoking use included cigarettes. She has a 15.00 pack-year smoking history. She has never used smokeless tobacco. She reports that she drinks alcohol. She reports that she does not use drugs.

## 2018-05-22 ENCOUNTER — Other Ambulatory Visit: Payer: Self-pay | Admitting: Pulmonary Disease

## 2018-05-22 DIAGNOSIS — R058 Other specified cough: Secondary | ICD-10-CM

## 2018-05-22 DIAGNOSIS — R05 Cough: Secondary | ICD-10-CM

## 2018-05-28 ENCOUNTER — Inpatient Hospital Stay: Admission: RE | Admit: 2018-05-28 | Payer: 59 | Source: Ambulatory Visit

## 2018-06-04 ENCOUNTER — Ambulatory Visit (INDEPENDENT_AMBULATORY_CARE_PROVIDER_SITE_OTHER)
Admission: RE | Admit: 2018-06-04 | Discharge: 2018-06-04 | Disposition: A | Discharge status: Home / Self Care | Payer: 59 | Source: Ambulatory Visit | Attending: Pulmonary Disease | Admitting: Pulmonary Disease

## 2018-06-04 ENCOUNTER — Encounter: Payer: Self-pay | Admitting: Radiology

## 2018-06-04 DIAGNOSIS — D869 Sarcoidosis, unspecified: Secondary | ICD-10-CM | POA: Diagnosis not present

## 2018-06-09 ENCOUNTER — Other Ambulatory Visit: Payer: Self-pay | Admitting: Pulmonary Disease

## 2018-06-09 ENCOUNTER — Telehealth: Payer: Self-pay | Admitting: Pulmonary Disease

## 2018-06-09 NOTE — Telephone Encounter (Signed)
HRCT chest 06/04/18 >> decreased nodularity   Please let her know that CT chest looks better.    She can change prednisone to 5 mg every other day and do this for 3 weeks.  If her symptoms are stable after this 3 week time period, then she can stop taking prednisone.

## 2018-06-11 MED ORDER — PREDNISONE 5 MG PO TABS: ORAL_TABLET | ORAL | 0 refills | Status: DC

## 2018-06-11 NOTE — Telephone Encounter (Signed)
Called and spoke with pt letting her know the results of the CT. Stated to pt that VS is wanting her to do 5mg  prednisone every other day for 3 weeks and if she had stable symptoms after the 3 weeks, she could stop taking the prednisone. Pt expressed understanding. I have verified pt's preferred pharmacy and have sent Rx in for pt. Nothing further needed.

## 2018-06-11 NOTE — Telephone Encounter (Signed)
Patient returned call, CB is [phone]

## 2018-06-11 NOTE — Telephone Encounter (Signed)
lmtcb x1 for pt. 

## 2018-08-16 ENCOUNTER — Encounter: Payer: Self-pay | Admitting: Pulmonary Disease

## 2018-08-16 ENCOUNTER — Ambulatory Visit (INDEPENDENT_AMBULATORY_CARE_PROVIDER_SITE_OTHER): Payer: 59 | Admitting: Pulmonary Disease

## 2018-08-16 VITALS — BP 122/70 | HR 106 | Ht 65.0 in | Wt 283.0 lb

## 2018-08-16 DIAGNOSIS — D869 Sarcoidosis, unspecified: Secondary | ICD-10-CM

## 2018-08-16 DIAGNOSIS — R05 Cough: Secondary | ICD-10-CM

## 2018-08-16 DIAGNOSIS — J984 Other disorders of lung: Secondary | ICD-10-CM | POA: Diagnosis not present

## 2018-08-16 DIAGNOSIS — R059 Cough, unspecified: Secondary | ICD-10-CM

## 2018-08-16 DIAGNOSIS — Z6841 Body Mass Index (BMI) 40.0 and over, adult: Secondary | ICD-10-CM

## 2018-08-16 NOTE — Patient Instructions (Addendum)
Try saline nasal spray  Follow up in 4 months

## 2018-08-16 NOTE — Progress Notes (Signed)
[facility]  Chief Complaint  Patient presents with  . Follow-up    Pt has SOB with exertion and productive cough-yellow thick since Dec 2019    Constitutional:  BP 122/70 (BP Location: Left Arm, Cuff Size: Normal)   Pulse (!) 106   Ht 5\' 5"  (1.651 m)   Wt 283 lb (128.4 kg)   SpO2 98%   BMI 47.09 kg/m   Past Medical History:  Anxiety, Anemia, Depression, GERD, Gallstones, IBS, Migraine HA, Vit D deficiency  Brief Summary:  Yvonne Banks is a 48 y.o. female former smoker with sarcoidosis.  She has been off prednisone.  Breathing has been okay.  Gets drainage from sinus and coughs up yellow sputum in the morning.  Has some drainage that is clearer later in the day.  Not having fever, sinus pressure, chest pain, or wheeze.  Gets short of breath when walking, but recovers quickly after resting.  Say Dr. Dossie Der with rheumatology.  Has follow up later this week.  Physical Exam:   Appearance - well kempt   ENMT - no sinus tenderness, no nasal discharge, no oral exudate, 2+ tonsils  Neck - no masses, trachea midline, no thyromegaly, no elevation in JVP  Respiratory - normal appearance of chest wall, normal respiratory effort w/o accessory muscle use, no dullness on percussion, no tactile fremitus, no wheezing or rales  CV - s1s2 regular rate and rhythm, no murmurs, no peripheral edema, radial pulses symmetric  GI - soft, non tender  Lymph - no adenopathy noted in neck and axillary areas  MSK - normal gait  Ext - no cyanosis, clubbing, or joint inflammation noted  Skin - no rashes, lesions, or ulcers  Neuro - normal strength, oriented x 3  Psych - normal mood and affect    Assessment/Plan:   Sarcoidosis. - most recent imaging studies showed improved - respiratory status stable off prednisone - she will f/u with rheumatology later this week to discuss whether there is systemic involvement  Allergic asthma. - continue  symbicort, singulair  Upper airway cough. - continue flonase, singulair - advised her to add nasal irrigation  Snoring. - monitor sleep pattern  Obesity. - encouraged her to keep up with weight loss efforts   Patient Instructions  Try saline nasal spray  Follow up in 4 months   Chesley Mires, MD [facility] Pager: [phone] 08/16/2018, 10:06 AM  Flow Sheet     Pulmonary tests:  Bronchoscopy 12/04/17 >> negative for malignancy ACE 12/08/17 >> 84 Quatiferon gold 12/08/17 >> negative Cryptococcal Ag 01/06/18 >> positive, titer 1:80 PFT 01/19/18 >> FEV1 2.11 (82%), FEV1% 88, TLC 4.27 (80%), DLCO 63%  Chest imaging:  CT angio chest 12/03/17 >> borderline LAN, mass like consolidation in Rt upper and lower lobes CT chest 03/01/18 >> decreased size of apical opacities HRCT chest 06/04/18 >> decreased nodularity  Cardiac tests:  Echo 04/08/18 >> EF 65 to 70%, grade 1 DD   Medications:   Allergies as of 08/16/2018      Reactions   Other Anaphylaxis   Spicy peppers - anything hot or spicy causes swelling and skin inflammation Cilantro- throat closes, trouble breathing   Pineapple Anaphylaxis   Throat closes, tongue swells   Adhesive [tape] Other (See Comments)   Causes skin discoloration.   Erythromycin Nausea And Vomiting   Iron    Stomach pain. Pt can take Integra for iron   Latex Other (See Comments)   Discolors skin  Medication List       Accurate as of August 16, 2018 10:06 AM. Always use your most recent med list.        amitriptyline 25 MG tablet Commonly known as:  ELAVIL Take 25 mg by mouth at bedtime.   budesonide-formoterol 160-4.5 MCG/ACT inhaler Commonly known as:  SYMBICORT Inhale 2 puffs into the lungs 2 (two) times daily.   budesonide-formoterol 160-4.5 MCG/ACT inhaler Commonly known as:  SYMBICORT Inhale 2 puffs into the lungs 2 (two) times daily.   cetirizine 10 MG tablet Commonly known as:  ZYRTEC Take 10 mg  by mouth at bedtime.   CLARAVIS 40 MG capsule Generic drug:  ISOtretinoin Take 40 mg by mouth 2 (two) times daily.   Dapsone 5 % topical gel Apply 1 application topically at bedtime.   fluticasone 50 MCG/ACT nasal spray Commonly known as:  FLONASE Place 2 sprays into both nostrils daily.   montelukast 10 MG tablet Commonly known as:  SINGULAIR Take 10 mg by mouth at bedtime.   omeprazole 20 MG capsule Commonly known as:  PRILOSEC TAKE 1 CAPSULE (20 MG) BY MOUTH DAILY IN THE MORNING AND EMPTY STOMACH WITH A GLASS OF WATER   PARoxetine 10 MG tablet Commonly known as:  PAXIL Take 10 mg by mouth at bedtime.   promethazine 25 MG tablet Commonly known as:  PHENERGAN Take 25 mg by mouth every 6 (six) hours as needed for nausea or vomiting.   rizatriptan 10 MG tablet Commonly known as:  MAXALT Take 10 mg by mouth as needed for migraine.   tapentadol 50 MG tablet Commonly known as:  NUCYNTA Take 50 mg by mouth.   tiZANidine 4 MG tablet Commonly known as:  ZANAFLEX Take 2 mg by mouth every 8 (eight) hours as needed for migraine.   triamcinolone cream 0.1 % Commonly known as:  KENALOG APPLY TO AFFECTED AREA AS DIRECTED FOR ITCHY RASH AS NEEDED       Past Surgical History:  She  has a past surgical history that includes Wisdom tooth extraction; Dilatation & currettage/hysteroscopy with resectoscope (N/A, 11/05/2012); skyla iud (01-14-13); Cholecystectomy (N/A, 05/19/2013); IUD removal; Bladder surgery; Dilation and curettage of uterus (11/2013); Myomectomy (N/A, 02/17/2014); Video bronchoscopy (Bilateral, 12/04/2017); and LEEP (N/A, 02/26/2018).  Family History:  Her family history includes Allergies in her maternal aunt, mother, and sister; Anemia in her mother; Asthma in her maternal aunt and mother; Breast cancer in her paternal aunt; Cancer in her maternal grandmother; Hypertension in her mother; Lung cancer in her cousin; Ulcers in her sister.  Social History:  She  reports  that she quit smoking about 18 years ago. Her smoking use included cigarettes. She has a 15.00 pack-year smoking history. She has never used smokeless tobacco. She reports current alcohol use. She reports that she does not use drugs.

## 2018-09-17 IMAGING — CT CT CHEST W/ CM
2 of 3 series · 15 of 36 positions shown, 18 images · IV contrast (iopamidol)
Comparison: 12/03/2017

CLINICAL DATA: Sarcoid.

EXAM:
CT CHEST WITH CONTRAST
TECHNIQUE: Multidetector CT imaging of the chest was performed during
intravenous contrast administration.
CONTRAST:  80mL [id number] IOPAMIDOL ([id number]) INJECTION 61%

[Series 2: thorax · axial · 0.68mm/px · z∈[-337,-95]mm · 12 of 143 slices shown, 15 images]
[im 11/143  mediastinal]
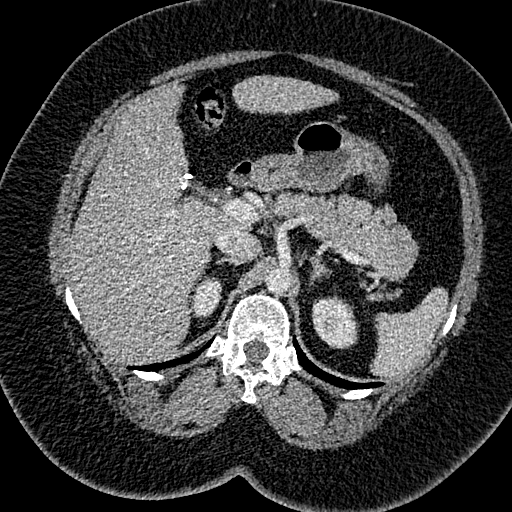
[im 11/143  lung]
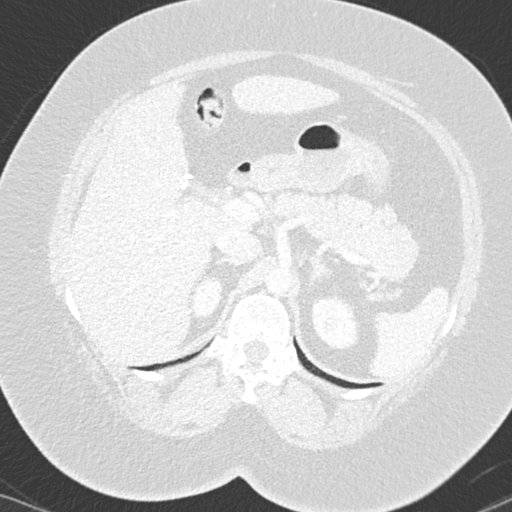
[im 22/143  lung]
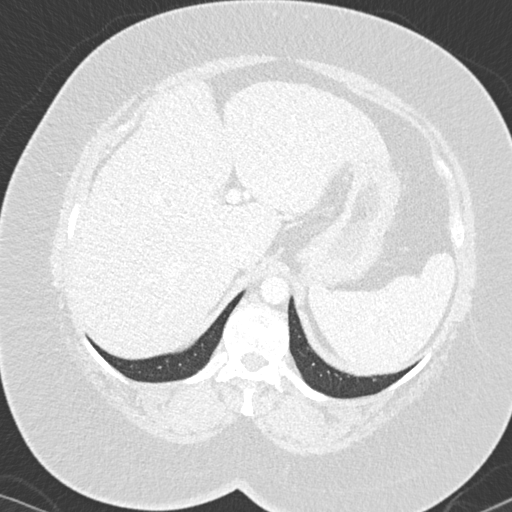
[im 32/143  lung]
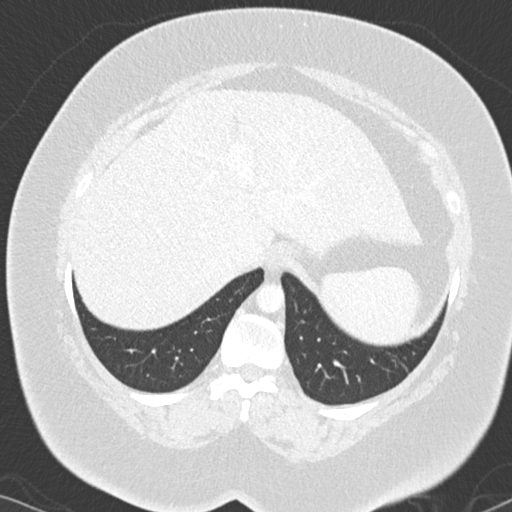
[im 43/143  lung]
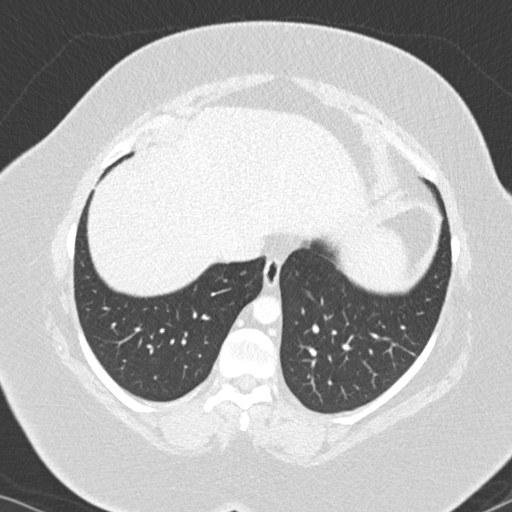
[im 53/143  mediastinal]
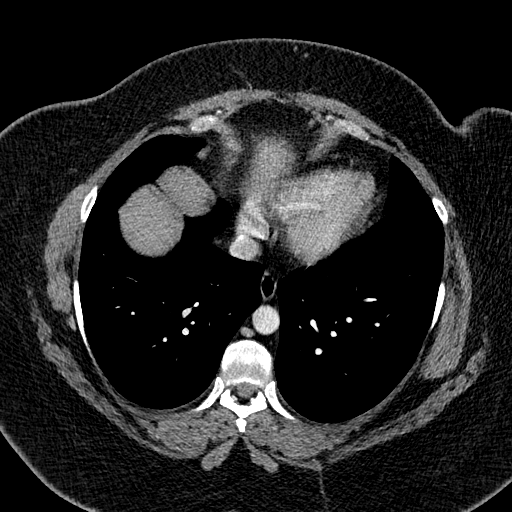
[im 53/143  lung]
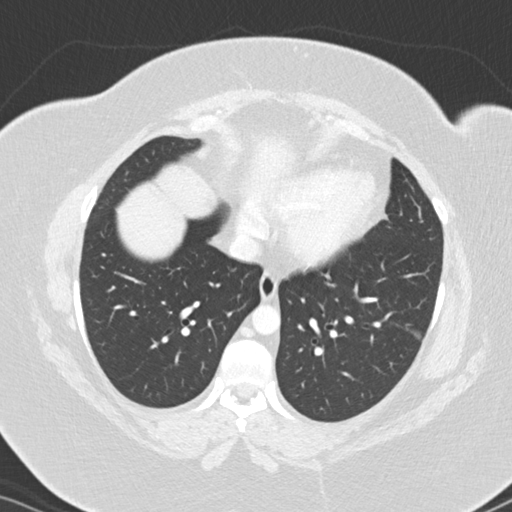
[im 64/143  lung]
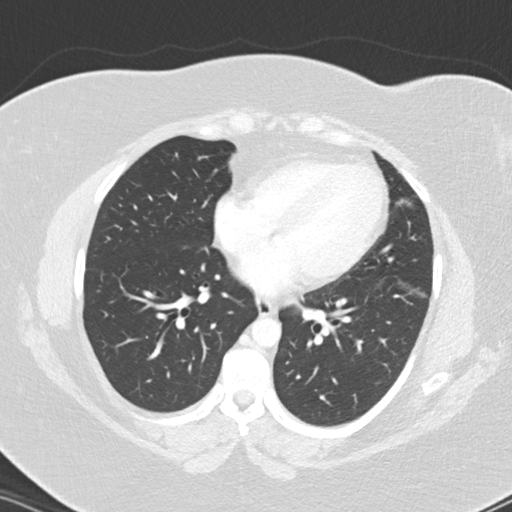
[im 79/143  lung]
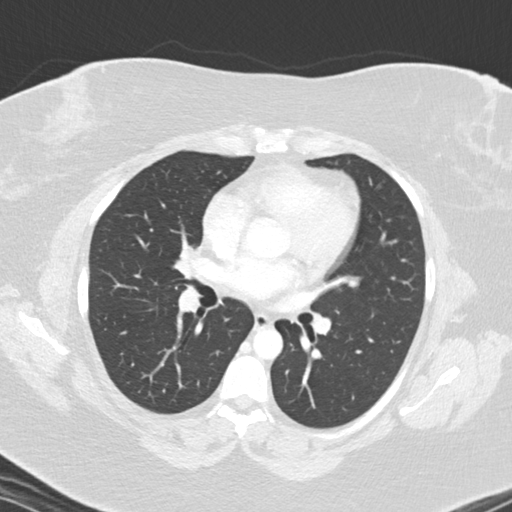
[im 90/143  lung]
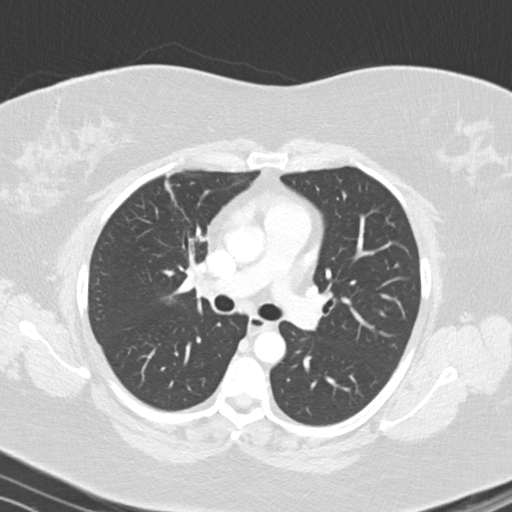
[im 100/143  mediastinal]
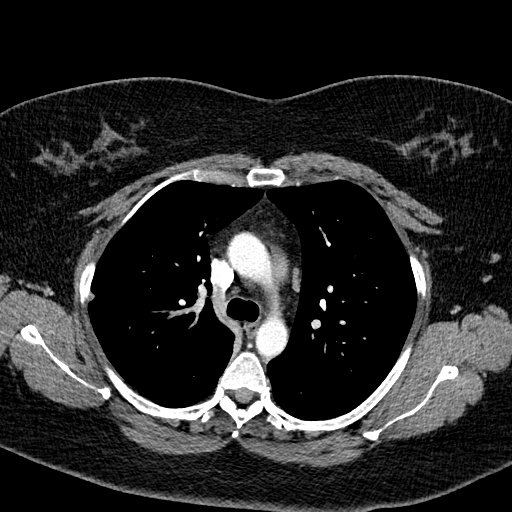
[im 100/143  lung]
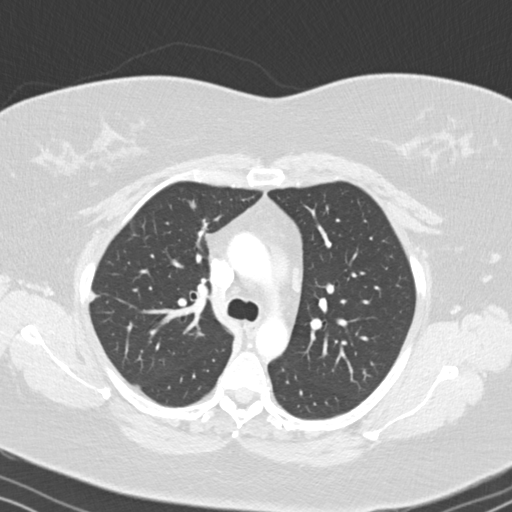
[im 111/143  lung]
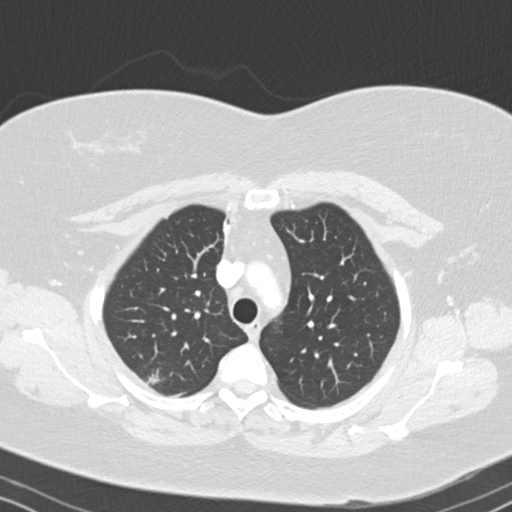
[im 121/143  lung]
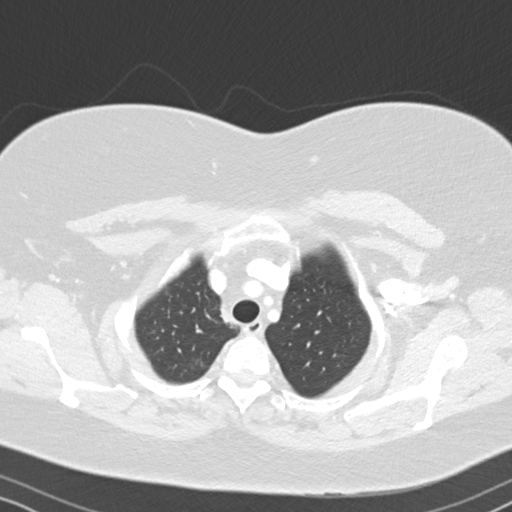
[im 132/143  lung]
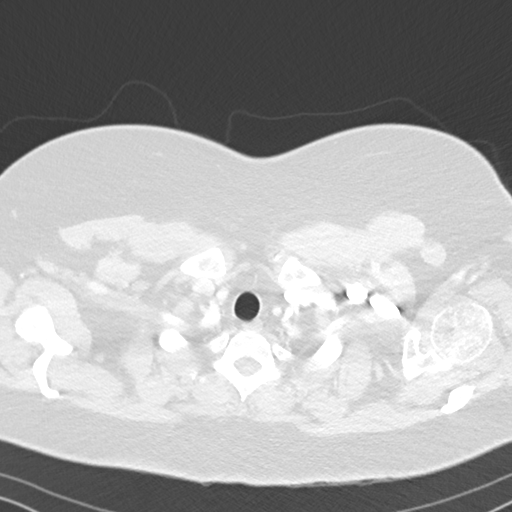

[Series 5: coronal · coronal · 0.59mm/px · 3 of 117 slices shown]
[im 24/117  lung]
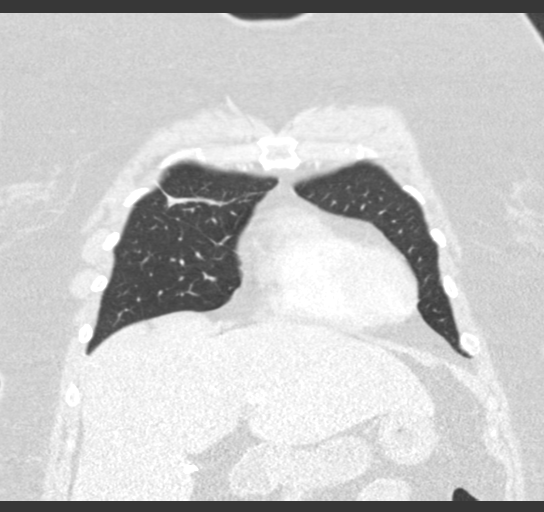
[im 47/117  lung]
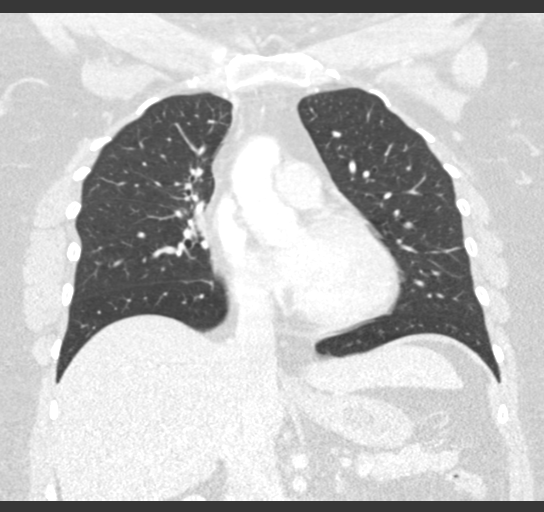
[im 70/117  lung]
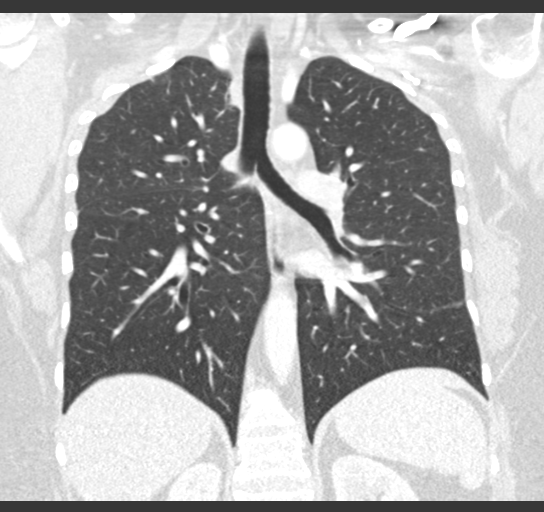

[15 of 36 positions shown; findings below may reference images not displayed]

FINDINGS: Cardiovascular: The heart size is normal. No substantial pericardial
effusion.

Mediastinum/Nodes: 12 mm short axis prevascular lymph node measured
previously has resolved. A persistent 3 mm short axis prevascular
node ([DATE]) was 9 mm when I remeasure it on prior study. High right
paratracheal node measured previously at 10 mm short axis is 4 mm
short axis today. Right hilar lymphadenopathy measured previously
has resolved. No left hilar lymphadenopathy on today's study. No
axillary lymphadenopathy.

Lungs/Pleura: The bilateral parenchymal opacities seen on the prior
study have improved substantially and resolved in some locations.
Posterior right upper lobe nodule measured previously at 19 x 35 mm
has decreased and measures 9 x 22 mm today ([DATE]). 8 mm anterior
right upper lobe nodule measured previously has resolved. 7 mm right
perifissural nodule today ([DATE]) was 13 mm when I remeasure it in a
similar fashion on prior study. No pleural effusion on today's
study.

Upper Abdomen: Unremarkable.

Musculoskeletal: No worrisome lytic or sclerotic osseous
abnormality.
IMPRESSION: Marked interval improvement and mediastinal/hilar lymphadenopathy
and pulmonary parenchymal lung disease.

## 2018-10-01 ENCOUNTER — Telehealth: Payer: Self-pay | Admitting: Pulmonary Disease

## 2018-10-01 NOTE — Telephone Encounter (Signed)
Left message for patient to call back  

## 2018-10-04 NOTE — Telephone Encounter (Signed)
Called patient unable to reach LMTCB 

## 2018-10-05 NOTE — Telephone Encounter (Signed)
Called and spoke with Patient.  Patient stated that she believed her chest pain was from recent physical therapy and allergies.  Patient stated that chest pain was better and felt more muscular then lung.  Patient stated that her allergies are bothering her.  Patient stated that she is taking nasal spray and OTC allergy meds.  Patient stated she would contact office if she feels that it is needed.  Nothing further at this time.

## 2018-10-21 ENCOUNTER — Other Ambulatory Visit: Payer: Self-pay

## 2018-10-21 ENCOUNTER — Ambulatory Visit (INDEPENDENT_AMBULATORY_CARE_PROVIDER_SITE_OTHER): Payer: 59 | Admitting: Nurse Practitioner

## 2018-10-21 DIAGNOSIS — D869 Sarcoidosis, unspecified: Secondary | ICD-10-CM

## 2018-10-21 NOTE — Progress Notes (Signed)
Virtual Visit via Telephone Note  I connected with Yvonne Banks on 10/21/18 at  2:30 PM EDT by telephone and verified that I am speaking with the correct person using two identifiers.   I discussed the limitations, risks, security and privacy concerns of performing an evaluation and management service by telephone and the availability of in person appointments. I also discussed with the patient that there may be a patient responsible charge related to this service. The patient expressed understanding and agreed to proceed.   History of Present Illness: 48 y.o. female former smoker with sarcoidosis followed by Dr, Halford Chessman.  Patient has a tele-visit today for follow-up.  She complains today of some mild chest tightness and shortness of breath with exertion.  She is concerned that her sarcoid is worsening.  States that it is not a significant difference from her baseline but wanted a follow-up scan to make sure her sarcoid is not worsening.  She last saw Dr. Halford Chessman on 08/16/2018.  Her most recent imaging studies did show improvement.  She is compliant with Symbicort and Singulair.  She also takes Zyrtec daily. Denies f/c/s, n/v/d, hemoptysis, PND, leg swelling Denies chest pain or edema    Observations/Objective: Pulmonary tests: Bronchoscopy 12/04/17 >> negative for malignancy ACE 12/08/17 >> 84 Quatiferon gold 12/08/17 >> negative Cryptococcal Ag 01/06/18 >> positive, titer 1:80 PFT7/02/19>> FEV1 2.11 (82%), FEV1% 88, TLC 4.27 (80%), DLCO 63%  Chest imaging: CT angio chest 12/03/17 >> borderline LAN, mass like consolidation in Rt upper and lower lobes CT chest 03/01/18 >> decreased size of apical opacities HRCT chest 06/04/18 >> decreased nodularity  Cardiac tests: Echo 04/08/18 >> EF 65 to 70%, grade 1 DD  Assessment and Plan: Discussion: Patient has a tele-visit today for follow-up.  She complains today of some mild chest tightness and shortness of breath with exertion.  She is concerned  that her sarcoid is worsening.  States that it is not a significant difference from her baseline but wanted a follow-up scan to make sure her sarcoid is not worsening.  She last saw Dr. Halford Chessman on 08/16/2018.  Her most recent imaging studies did show improvement.  She is compliant with Symbicort and Singulair.  She also takes Zyrtec daily.   Patient Instructions  Sarcoidosis. - most recent imaging studies showed improved - Will order follow up CT in July  Allergic asthma. - continue symbicort, singulair  Upper airway cough. - continue flonase, singulair - advised her to add nasal irrigation  Obesity. - encouraged her to keep up with weight loss efforts    Follow Up Instructions:  Follow up with Dr. Halford Chessman in 4 months with CT scan before visit    I discussed the assessment and treatment plan with the patient. The patient was provided an opportunity to ask questions and all were answered. The patient agreed with the plan and demonstrated an understanding of the instructions.   The patient was advised to call back or seek an in-person evaluation if the symptoms worsen or if the condition fails to improve as anticipated.  I provided 22 minutes of non-face-to-face time during this encounter.   Fenton Foy, NP

## 2018-10-22 ENCOUNTER — Other Ambulatory Visit: Payer: Self-pay | Admitting: General Surgery

## 2018-10-22 ENCOUNTER — Encounter: Payer: Self-pay | Admitting: Nurse Practitioner

## 2018-10-22 DIAGNOSIS — D869 Sarcoidosis, unspecified: Secondary | ICD-10-CM | POA: Insufficient documentation

## 2018-10-22 NOTE — Assessment & Plan Note (Signed)
Discussion: Patient has a tele-visit today for follow-up.  She complains today of some mild chest tightness and shortness of breath with exertion.  She is concerned that her sarcoid is worsening.  States that it is not a significant difference from her baseline but wanted a follow-up scan to make sure her sarcoid is not worsening.  She last saw Dr. Halford Chessman on 08/16/2018.  Her most recent imaging studies did show improvement.  She is compliant with Symbicort and Singulair.  She also takes Zyrtec daily.   Patient Instructions  Sarcoidosis. - most recent imaging studies showed improved - Will order follow up CT in July  Allergic asthma. - continue symbicort, singulair  Upper airway cough. - continue flonase, singulair - advised her to add nasal irrigation  Obesity. - encouraged her to keep up with weight loss efforts  Follow up: Follow up with Dr. Halford Chessman in 4 months with CT scan before visit

## 2018-10-22 NOTE — Patient Instructions (Addendum)
Sarcoidosis. - most recent imaging studies showed improved - Will order follow up CT in July  Allergic asthma. - continue symbicort, singulair  Upper airway cough. - continue flonase, singulair - advised her to add nasal irrigation  Obesity. - encouraged her to keep up with weight loss efforts  Follow up: Follow up with Yvonne Banks in 4 months with CT scan before visit

## 2018-11-29 ENCOUNTER — Ambulatory Visit: Payer: 59 | Admitting: Pulmonary Disease

## 2019-02-18 NOTE — Progress Notes (Signed)
Reviewed and agree with assessment/plan.   Tecora Eustache, MD [facility] 07/16/2016, 12:24 PM Pager:  [phone]  

## 2019-03-09 ENCOUNTER — Ambulatory Visit (INDEPENDENT_AMBULATORY_CARE_PROVIDER_SITE_OTHER)
Admission: RE | Admit: 2019-03-09 | Discharge: 2019-03-09 | Disposition: A | Discharge status: Home / Self Care | Payer: 59 | Source: Ambulatory Visit | Attending: Nurse Practitioner | Admitting: Nurse Practitioner

## 2019-03-09 ENCOUNTER — Other Ambulatory Visit: Payer: Self-pay

## 2019-03-09 DIAGNOSIS — D869 Sarcoidosis, unspecified: Secondary | ICD-10-CM | POA: Diagnosis not present

## 2019-03-10 ENCOUNTER — Ambulatory Visit (INDEPENDENT_AMBULATORY_CARE_PROVIDER_SITE_OTHER): Payer: 59 | Admitting: Pulmonary Disease

## 2019-03-10 ENCOUNTER — Encounter: Payer: Self-pay | Admitting: Pulmonary Disease

## 2019-03-10 VITALS — BP 118/84 | HR 87 | Temp 96.6°F | Ht 65.0 in | Wt 274.0 lb

## 2019-03-10 DIAGNOSIS — R05 Cough: Secondary | ICD-10-CM | POA: Diagnosis not present

## 2019-03-10 DIAGNOSIS — J4540 Moderate persistent asthma, uncomplicated: Secondary | ICD-10-CM

## 2019-03-10 DIAGNOSIS — D869 Sarcoidosis, unspecified: Secondary | ICD-10-CM

## 2019-03-10 DIAGNOSIS — Z23 Encounter for immunization: Secondary | ICD-10-CM | POA: Diagnosis not present

## 2019-03-10 DIAGNOSIS — Z6841 Body Mass Index (BMI) 40.0 and over, adult: Secondary | ICD-10-CM

## 2019-03-10 DIAGNOSIS — R058 Other specified cough: Secondary | ICD-10-CM

## 2019-03-10 MED ORDER — ARNUITY ELLIPTA 100 MCG/ACT IN AEPB: 1.0000 | INHALATION_SPRAY | Freq: Every day | RESPIRATORY_TRACT | 5 refills | Status: DC

## 2019-03-10 MED ORDER — ARNUITY ELLIPTA 100 MCG/ACT IN AEPB: 1.0000 | INHALATION_SPRAY | Freq: Every day | RESPIRATORY_TRACT | 0 refills | Status: DC

## 2019-03-10 NOTE — Addendum Note (Signed)
Addended by: Vivia Ewing on: 03/10/2019 09:44 AM   Modules accepted: Orders

## 2019-03-10 NOTE — Progress Notes (Signed)
[facility]  Chief Complaint  Patient presents with  . Follow-up    F/U re: Sarcoidosis and asthma. Reports her breathing has been at her baseline. Needs to discuss CT results.     Constitutional:  BP 118/84   Pulse 87   Temp (!) 96.6 F (35.9 C) (Temporal)   Ht 5\' 5"  (1.651 m)   Wt 274 lb (124.3 kg)   LMP 02/23/2019   SpO2 97%   BMI 45.60 kg/m   Past Medical History:  Anxiety, Anemia, Depression, GERD, Gallstones, IBS, Migraine HA, Vit D deficiency  Brief Summary:  Yvonne Banks is a 48 y.o. female former smoker with sarcoidosis and asthma.  She was seen by Dr. Dossie Der with rheumatology.  Didn't think her back pain was related to sarcoid.  She has appointment with eye doctor later this year for follow.  CT chest from yesterday reviewed by me with her.  Shows clearing of Rt upper lung opacities with minimal scarring and mild air trapping.  She still gets winded with walking, but better than before.  She has changed her diet.  She has lost about 8 lbs since her last visit with me.  Gets sinus congestion, post nasal drip, and cough in the morning.  Not having wheeze, hemoptysis, fever, skin rash, or leg swelling.  Physical Exam:   Appearance - well kempt   ENMT - no sinus tenderness, no nasal discharge, no oral exudate  Neck - no masses, trachea midline, no thyromegaly, no elevation in JVP  Respiratory - normal appearance of chest wall, normal respiratory effort w/o accessory muscle use, no dullness on percussion, no wheezing or rales  CV - s1s2 regular rate and rhythm, no murmurs, no peripheral edema, radial pulses symmetric  GI - soft, non tender  Lymph - no adenopathy noted in neck and axillary areas  MSK - normal gait  Ext - no cyanosis, clubbing, or joint inflammation noted  Skin - no rashes, lesions, or ulcers  Neuro - normal strength, oriented x 3  Psych - normal mood and affect     Assessment/Plan:    Sarcoidosis. - essentially resolved on recent imaging studies - monitor clinically  Allergic asthma. - change from symbicort to arnuity 100 one puff daily - continue singulair - prn albuterol - flu shot today  Upper airway cough. - continue singulair - advised she could change flonase and zyrtec to prn once her symptoms improve  Snoring. - sleep pattern has improved some with weight loss  Obesity. - encouraged her to keep up with weight loss efforts   Patient Instructions  Stop symbicort  Start arnuity one puff daily, and rinse mouth after each use  Continue singulair 10 mg pill nightly  If your sinus symptoms are better, then you can change flonase and zyrtec to be used as needed  Flu shot today  Follow up in 4 months   Chesley Mires, MD [place] Pager: [phone] 03/10/2019, 9:32 AM  Flow Sheet     Pulmonary tests:  Bronchoscopy 12/04/17 >> negative for malignancy ACE 12/08/17 >> 84 Quatiferon gold 12/08/17 >> negative Cryptococcal Ag 01/06/18 >> positive, titer 1:80 PFT 01/19/18 >> FEV1 2.11 (82%), FEV1% 88, TLC 4.27 (80%), DLCO 63%  Chest imaging:  CT angio chest 12/03/17 >> borderline LAN, mass like consolidation in Rt upper and lower lobes CT chest 03/01/18 >> decreased size of apical opacities HRCT chest 06/04/18 >> decreased nodularity HRCT chest 03/09/19 >> near complete resolution  of RUL opacity and nodularity with minimal bandlike scarring, minimal air trapping  Cardiac tests:  Echo 04/08/18 >> EF 65 to 70%, grade 1 DD   Medications:   Allergies as of 03/10/2019      Reactions   Other Anaphylaxis   Spicy peppers - anything hot or spicy causes swelling and skin inflammation Cilantro- throat closes, trouble breathing   Pineapple Anaphylaxis   Throat closes, tongue swells   Adhesive [tape] Other (See Comments)   Causes skin discoloration.   Erythromycin Nausea And Vomiting   Iron    Stomach pain. Pt can take Integra for  iron   Latex Other (See Comments)   Discolors skin      Medication List       Accurate as of March 10, 2019  9:32 AM. If you have any questions, ask your nurse or doctor.        STOP taking these medications   budesonide-formoterol 160-4.5 MCG/ACT inhaler Commonly known as: Symbicort Stopped by: Chesley Mires, MD   Claravis 40 MG capsule Generic drug: ISOtretinoin Stopped by: Chesley Mires, MD   Dapsone 5 % topical gel Stopped by: Chesley Mires, MD   PARoxetine 10 MG tablet Commonly known as: PAXIL Stopped by: Chesley Mires, MD   tapentadol 50 MG tablet Commonly known as: NUCYNTA Stopped by: Chesley Mires, MD     TAKE these medications   amitriptyline 25 MG tablet Commonly known as: ELAVIL Take 25 mg by mouth at bedtime.   Arnuity Ellipta 100 MCG/ACT Aepb Generic drug: Fluticasone Furoate Inhale 1 puff into the lungs daily. Started by: Chesley Mires, MD   cetirizine 10 MG tablet Commonly known as: ZYRTEC Take 10 mg by mouth at bedtime.   Contrave 8-90 MG Tb12 Generic drug: Naltrexone-buPROPion HCl ER Contrave 8 mg-90 mg tablet,extended release  Take 1 tablet twice a day by oral route.  start 1 tablet once a day by mouth for 1 wk, then 1 tablet twice a day   fluticasone 50 MCG/ACT nasal spray Commonly known as: FLONASE Place 2 sprays into both nostrils daily.   montelukast 10 MG tablet Commonly known as: SINGULAIR Take 10 mg by mouth at bedtime.   NON FORMULARY Tumeric drops   NON FORMULARY Vitamin C drops with elderberry   NON FORMULARY Vit B complex drops   NON FORMULARY Vit D chocolate chews with Calcium 500mg    omeprazole 20 MG capsule Commonly known as: PRILOSEC TAKE 1 CAPSULE (20 MG) BY MOUTH DAILY IN THE MORNING AND EMPTY STOMACH WITH A GLASS OF WATER   promethazine 25 MG tablet Commonly known as: PHENERGAN Take 25 mg by mouth every 6 (six) hours as needed for nausea or vomiting.   rizatriptan 10 MG tablet Commonly known as: MAXALT Take 10  mg by mouth as needed for migraine.   tiZANidine 4 MG tablet Commonly known as: ZANAFLEX Take 2 mg by mouth every 8 (eight) hours as needed for migraine.   triamcinolone cream 0.1 % Commonly known as: KENALOG APPLY TO AFFECTED AREA AS DIRECTED FOR ITCHY RASH AS NEEDED       Past Surgical History:  She  has a past surgical history that includes Wisdom tooth extraction; Dilatation & currettage/hysteroscopy with resectoscope (N/A, 11/05/2012); skyla iud (01-14-13); Cholecystectomy (N/A, 05/19/2013); IUD removal; Bladder surgery; Dilation and curettage of uterus (11/2013); Myomectomy (N/A, 02/17/2014); Video bronchoscopy (Bilateral, 12/04/2017); and LEEP (N/A, 02/26/2018).  Family History:  Her family history includes Allergies in her maternal aunt, mother, and sister; Anemia  in her mother; Asthma in her maternal aunt and mother; Breast cancer in her paternal aunt; Cancer in her maternal grandmother; Hypertension in her mother; Lung cancer in her cousin; Ulcers in her sister.  Social History:  She  reports that she quit smoking about 18 years ago. Her smoking use included cigarettes. She has a 15.00 pack-year smoking history. She has never used smokeless tobacco. She reports current alcohol use. She reports that she does not use drugs.

## 2019-03-10 NOTE — Patient Instructions (Signed)
Stop symbicort  Start arnuity one puff daily, and rinse mouth after each use  Continue singulair 10 mg pill nightly  If your sinus symptoms are better, then you can change flonase and zyrtec to be used as needed  Flu shot today  Follow up in 4 months

## 2019-09-28 ENCOUNTER — Other Ambulatory Visit: Payer: Self-pay | Admitting: Pulmonary Disease

## 2019-09-28 ENCOUNTER — Other Ambulatory Visit: Payer: Self-pay | Admitting: Obstetrics and Gynecology

## 2019-09-28 DIAGNOSIS — R05 Cough: Secondary | ICD-10-CM

## 2019-09-28 DIAGNOSIS — R058 Other specified cough: Secondary | ICD-10-CM

## 2019-09-28 MED ORDER — OMEPRAZOLE 20 MG PO CPDR: DELAYED_RELEASE_CAPSULE | ORAL | 3 refills | Status: DC

## 2020-01-24 ENCOUNTER — Other Ambulatory Visit: Payer: Self-pay | Admitting: Pulmonary Disease

## 2020-01-24 DIAGNOSIS — R058 Other specified cough: Secondary | ICD-10-CM

## 2020-04-12 ENCOUNTER — Encounter: Payer: 59 | Attending: Obstetrics and Gynecology | Admitting: Registered"

## 2020-04-12 ENCOUNTER — Other Ambulatory Visit: Payer: Self-pay

## 2020-04-12 ENCOUNTER — Encounter: Payer: Self-pay | Admitting: Registered"

## 2020-04-12 DIAGNOSIS — Z713 Dietary counseling and surveillance: Secondary | ICD-10-CM

## 2020-04-12 NOTE — Progress Notes (Signed)
Medical Nutrition Therapy:  Appt start time: 9:30 end time: 10:37.   Assessment:  Primary concerns today: Pt states her OBGYN recommended she have nutrition appt. States they have been trying to work on looking at decreasing her weight.   Pt expectations: to make sure she is eating enough and not too much  Pt reports taking Saxenda to help with weight loss. Started Oct 2020. Has monthly calls with [facility] for weight checks. States she weighs daily sometimes multiple times a day to keep track of it.   States she told her provider she was trying to lose weight due to chronic back pain. Reports she was very limited in walking. States she is currently taking pain medication for back and unsure of cause of back pain. Reports she has tried a variety of treatments to help. States back pain progressed over time. 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 States in 2019, she was having back pain and chest pain, later diagnosed with sarcoidosis. States she was on steroids for a long time and weight increased to ~300 lbs.   States sometimes she will go without eating until dinner. Will continue to work through her day. Works from home. States she is always tired. Takes Vitamin B12, Zinc-Vit C-elderberry, Vit D-calcium supplements. 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 Cooks sometimes but challenging to stand for long periods of time.   Reports lightheadedness/dizziness and challenges with focus/concentration.   States she participated in [employer] in 2008 for 1 year; lost 50 lbs. States she was at a healthy weight. Kept weight off for about 2 years and then started reintroducing items she knows she shouldn't eat, but enjoys them such as sweets.   States she states up late to watch tv even though she knows she's tired. Goes to sleep around 12 am nightly.    Preferred Learning Style:   No preference indicated   Learning Readiness:   Ready  Change in  progress   MEDICATIONS: See list   DIETARY INTAKE: Allergies: cilantro, spicy peppers Usual eating pattern includes 1-2 meals and 1-2 snacks per day.  Everyday foods include cabbage, broccoli, green beans, spaghetti, fish, chili, rice, salad.  Avoided foods include oatmeal, white sauces/creams, peppers, mushrooms, and lamb.    24-hr recall:  B ( AM): coffee (cream + sugar)  Snk (10 AM): sometimes skips or Raisin Bran cereal  (ind bowl) + 2% lactose free milk or blueberry yogurt  L (1 PM): typically skips or apple or Healthy Choice meal Snk ( PM):  D (5:30 PM): chili (beans, lean beef) + shredded cheese or a small amount of pork roast + cabbage/broccoli + mashed potatoes Snk ( PM): bag of popcorn or apple or nutrigrain bar Beverages: sparkling water (2*16 oz, 32 oz), water (32 oz),   Usual physical activity: PT 60 min, 2x/week; stretching routine at home 20-30 min, 3x/day  Estimated energy needs: 1800-2000 calories 200-225 g carbohydrates 135-150 g protein 50-56 g fat  Progress Towards Goal(s):  In progress.   Nutritional Diagnosis:  NB-1.1 Food and nutrition-related knowledge deficit As related to skipping meals.  As evidenced by pt verablized incomplete knowledge.    Intervention:  Nutrition education and counseling. Pt was educated and counseled on the benefits of eating throughout the day, metabolism, eating to fuel the body, how skipping meals affects weight, signs/symptoms of not being adequately nourished, snack ideas, and hydration. Pt was in agreement with goal listed. Goals: - Aim to have breakfast within 1-2 hours  of waking up.  - Eat 3 meals/snacks every 3-5 hours.   Breakfast 9-9:30 am  Lunch 1 pm  Dinner 5:30 pm  Evening snack 9 pm - Snacks can include apple + peanut butter, cheese + crackers, peanut butter crackers, trail mix, granola bar, fruit + nuts, etc.  - Aim to increase water intake to at least 64 oz/day (4 water bottles). Have one each time you eat.    Teaching Method Utilized:  Visual Auditory Hands on  Handouts given during visit include:  none  Barriers to learning/adherence to lifestyle change: none identified  Demonstrated degree of understanding via:  Teach Back   Monitoring/Evaluation:  Dietary intake, exercise, and body weight prn.

## 2020-04-12 NOTE — Patient Instructions (Addendum)
-   Aim to have breakfast within 1-2 hours of waking up.   - Eat 3 meals/snacks every 3-5 hours.   Breakfast 9-9:30 am  Lunch 1 pm  Dinner 5:30 pm  Evening snack 9 pm  - Snacks can include apple + peanut butter, cheese + crackers, peanut butter crackers, trail mix, granola bar, fruit + nuts, etc.   - Aim to increase water intake to at least 64 oz/day (4 water bottles). Have one each time you eat.

## 2020-04-16 ENCOUNTER — Ambulatory Visit (INDEPENDENT_AMBULATORY_CARE_PROVIDER_SITE_OTHER): Payer: 59 | Admitting: Pulmonary Disease

## 2020-04-16 ENCOUNTER — Other Ambulatory Visit: Payer: Self-pay

## 2020-04-16 ENCOUNTER — Encounter: Payer: Self-pay | Admitting: Pulmonary Disease

## 2020-04-16 VITALS — BP 120/78 | HR 104 | Temp 96.2°F | Ht 65.0 in | Wt 262.0 lb

## 2020-04-16 DIAGNOSIS — J0110 Acute frontal sinusitis, unspecified: Secondary | ICD-10-CM | POA: Diagnosis not present

## 2020-04-16 DIAGNOSIS — J4540 Moderate persistent asthma, uncomplicated: Secondary | ICD-10-CM

## 2020-04-16 DIAGNOSIS — D869 Sarcoidosis, unspecified: Secondary | ICD-10-CM | POA: Diagnosis not present

## 2020-04-16 DIAGNOSIS — Z6841 Body Mass Index (BMI) 40.0 and over, adult: Secondary | ICD-10-CM

## 2020-04-16 MED ORDER — AZITHROMYCIN 250 MG PO TABS: ORAL_TABLET | ORAL | 0 refills | Status: AC

## 2020-04-16 MED ORDER — BUDESONIDE-FORMOTEROL FUMARATE 80-4.5 MCG/ACT IN AERO: 2.0000 | INHALATION_SPRAY | Freq: Two times a day (BID) | RESPIRATORY_TRACT | 12 refills | Status: DC

## 2020-04-16 NOTE — Progress Notes (Signed)
[facility]  Chief Complaint  Patient presents with  . Follow-up    pt states pain in chest every now in again and slight cough    Constitutional:  BP 120/78 (BP Location: Left Arm, Cuff Size: Normal)   Pulse (!) 104   Temp (!) 96.2 F (35.7 C) (Oral)   Ht 5\' 5"  (1.651 m)   Wt 262 lb (118.8 kg)   SpO2 96%   BMI 43.60 kg/m   Past Medical History:  Anemia, Depression, Uterine Fibroids, Gallstones, GERD, HSV, IBS, Migraine HA, Vit D deficiency  Past Surgical History:  Her  has a past surgical history that includes Wisdom tooth extraction; Dilatation & currettage/hysteroscopy with resectoscope (N/A, 11/05/2012); skyla iud (01-14-13); Cholecystectomy (N/A, 05/19/2013); IUD removal; Bladder surgery; Dilation and curettage of uterus (11/2013); Myomectomy (N/A, 02/17/2014); Video bronchoscopy (Bilateral, 12/04/2017); and LEEP (N/A, 02/26/2018).  Brief Summary:  Yvonne Banks is a 49 y.o. female former smoker with asthma.  She also has history of sarcoidosis.      Subjective:   She has not used singulair or inhalers for past several months.  She has been working with PT.  She gets winded easily.  Has intermittent chest discomfort and cough.  Takes several minutes for to recover her breathing.  She felt better when using symbicort.  She has been getting more sinus pressure and headaches.  Has been using flonase and zyrtec, but symptoms continue. Physical Exam:   Appearance - well kempt   ENMT - mild tenderness over frontal sinuses, no oral exudate, no LAN, Mallampati 3 airway, no stridor, 2+ tonsils  Respiratory - equal breath sounds bilaterally, no wheezing or rales  CV - s1s2 regular rate and rhythm, no murmurs  Ext - no clubbing, no edema  Skin - no rashes  Psych - normal mood and affect   Pulmonary testing:   Bronchoscopy 12/04/17 >> negative for malignancy  ACE 12/08/17 >> 84  Quatiferon gold 12/08/17 >>  negative  Cryptococcal Ag 01/06/18 >> positive, titer 1:80  PFT7/02/19>> FEV1 2.11 (82%), FEV1% 88, TLC 4.27 (80%), DLCO 63%  Chest Imaging:   CT angio chest 12/03/17 >> borderline LAN, mass like consolidation in Rt upper and lower lobes  CT chest 03/01/18 >> decreased size of apical opacities  HRCT chest 06/04/18 >> decreased nodularity  HRCT chest 03/09/19 >> near complete resolution of RUL opacity and nodularity with minimal bandlike scarring, minimal air trapping  Cardiac Tests:   Echo 04/08/18 >> EF 65 to 70%, grade 1 DD  Social History:  She  reports that she quit smoking about 19 years ago. Her smoking use included cigarettes. She has a 15.00 pack-year smoking history. She has never used smokeless tobacco. She reports current alcohol use. She reports that she does not use drugs.  Family History:  Her family history includes Allergies in her maternal aunt, mother, and sister; Anemia in her mother; Asthma in her maternal aunt and mother; Breast cancer in her paternal aunt; Cancer in her maternal grandmother; Hypertension in her mother; Lung cancer in her cousin; Ulcers in her sister.     Assessment/Plan:   Allergic asthma. - will have her resume symbicort - consider adding singulair back if her symptoms persist  Upper airway cough. - she has acute sinusitis - will give her course of zithromax - continue flonase, zyrtec - might need to add singulair back in  Snoring. - monitor her sleep pattern - she doesn't feel like she needs  sleep testing at this time  Obesity. - explained how her weight and deconditioning can contribute to her shortness of breath - if her breathing symptoms get worse, then will need repeat testing: Labs, CXR, PFT  Time Spent Involved in Patient Care on Day of Examination:  33 minutes  Follow up:  Patient Instructions  Symbicort two puffs twice per day, and rinse mouth after each use  Zithromax 250 mg pill >> 2 pills on day 1, then 1 pill  daily for next 4 days  Follow up in 6 months   Medication List:   Allergies as of 04/16/2020      Reactions   Other Anaphylaxis   Spicy peppers - anything hot or spicy causes swelling and skin inflammation Cilantro- throat closes, trouble breathing   Pineapple Anaphylaxis   Throat closes, tongue swells   Adhesive [tape] Other (See Comments)   Causes skin discoloration.   Erythromycin Nausea And Vomiting   Iron    Stomach pain. Pt can take Integra for iron   Latex Other (See Comments)   Discolors skin      Medication List       Accurate as of April 16, 2020  3:10 PM. If you have any questions, ask your nurse or doctor.        STOP taking these medications   Arnuity Ellipta 100 MCG/ACT Aepb Generic drug: Fluticasone Furoate Stopped by: Chesley Mires, MD   montelukast 10 MG tablet Commonly known as: SINGULAIR Stopped by: Chesley Mires, MD   omeprazole 20 MG capsule Commonly known as: PRILOSEC Stopped by: Chesley Mires, MD   triamcinolone cream 0.1 % Commonly known as: KENALOG Stopped by: Chesley Mires, MD     TAKE these medications   amitriptyline 25 MG tablet Commonly known as: ELAVIL Take 25 mg by mouth at bedtime.   azithromycin 250 MG tablet Commonly known as: ZITHROMAX Take 2 tablets (500 mg total) by mouth daily for 1 day, THEN 1 tablet (250 mg total) daily for 4 days. Start taking on: April 16, 2020 Started by: Chesley Mires, MD   budesonide-formoterol 80-4.5 MCG/ACT inhaler Commonly known as: Symbicort Inhale 2 puffs into the lungs in the morning and at bedtime. Started by: Chesley Mires, MD   cetirizine 10 MG tablet Commonly known as: ZYRTEC Take 10 mg by mouth at bedtime.   Contrave 8-90 MG Tb12 Generic drug: Naltrexone-buPROPion HCl ER Contrave 8 mg-90 mg tablet,extended release  Take 1 tablet twice a day by oral route.  start 1 tablet once a day by mouth for 1 wk, then 1 tablet twice a day   fluticasone 50 MCG/ACT nasal spray Commonly  known as: FLONASE Place 2 sprays into both nostrils daily.   NON FORMULARY Tumeric drops   NON FORMULARY Vitamin C drops with elderberry   NON FORMULARY Vit B complex drops   NON FORMULARY Vit D chocolate chews with Calcium 500mg    pantoprazole 40 MG tablet Commonly known as: PROTONIX Take 40 mg by mouth daily.   promethazine 25 MG tablet Commonly known as: PHENERGAN Take 25 mg by mouth every 6 (six) hours as needed for nausea or vomiting.   rizatriptan 10 MG tablet Commonly known as: MAXALT Take 10 mg by mouth as needed for migraine.   SAXENDA [place] Inject into the skin.   tiZANidine 4 MG tablet Commonly known as: ZANAFLEX Take 2 mg by mouth every 8 (eight) hours as needed for migraine.   traMADol 50 MG tablet Commonly known as: Veatrice [place]  Take by mouth every 6 (six) hours as needed.   venlafaxine 75 MG tablet Commonly known as: EFFEXOR Take 75 mg by mouth 2 (two) times daily.       Signature:  Chesley Mires, MD [place] Pager - [phone] 04/16/2020, 3:10 PM

## 2020-04-16 NOTE — Patient Instructions (Signed)
Symbicort two puffs twice per day, and rinse mouth after each use  Zithromax 250 mg pill >> 2 pills on day 1, then 1 pill daily for next 4 days  Follow up in 6 months

## 2020-04-25 ENCOUNTER — Telehealth: Payer: Self-pay | Admitting: Pulmonary Disease

## 2020-04-25 NOTE — Telephone Encounter (Signed)
PA request received from [facility]  Drug requested: Symbnicort 80 CMM Key: [id number] PA request has been sent to plan, and a determination is expected within 2 days.     Spoke with pt, inhaler not covered. PA started will update pt

## 2020-04-30 NOTE — Telephone Encounter (Signed)
Checked Cover My Meds. PA is still pending.

## 2020-05-01 NOTE — Telephone Encounter (Signed)
PA still pending. Message from [facility] below:  [facility] is processing your inquiry and will respond shortly with next steps. You are currently using the fastest method to process this request. Please do not fax or call [facility] to resubmit this request. To check for an update later, open this request again from your dashboard.

## 2020-05-03 MED ORDER — BUDESONIDE-FORMOTEROL FUMARATE 80-4.5 MCG/ACT IN AERO: 2.0000 | INHALATION_SPRAY | Freq: Two times a day (BID) | RESPIRATORY_TRACT | 12 refills | Status: DC

## 2020-05-03 NOTE — Telephone Encounter (Signed)
PA still says pending.  Called CMM and spoke to [place]. She states because the PA was submitted correctly and the plan hasnt responded then to contact the plan directly at [phone].   Called [facility] and spoke to [place]. She states the generic name was denied but brand name can be initiated for PA. I initiated brand name Symbicort PA and this was approved from 04/03/20 - 05/03/2023, case ID: [id number].   [facility]ed [facility] and spoke to technician. She states the script is still saying it needs a PA. She advised sending in a new brand name script to the pharmacy and calling back later this morning. Will forward to triage to follow up.

## 2020-05-08 ENCOUNTER — Other Ambulatory Visit: Payer: Self-pay | Admitting: Pain Medicine

## 2020-05-08 DIAGNOSIS — G8929 Other chronic pain: Secondary | ICD-10-CM

## 2020-05-08 NOTE — Telephone Encounter (Signed)
Called and spoke to pharmacy tech. Pt picked up Symbicort for $0 on 10/14. Nothing further needed at this time. Will sign off.

## 2020-05-10 ENCOUNTER — Other Ambulatory Visit: Payer: Self-pay | Admitting: Pain Medicine

## 2020-05-10 DIAGNOSIS — M549 Dorsalgia, unspecified: Secondary | ICD-10-CM

## 2020-05-10 DIAGNOSIS — G8929 Other chronic pain: Secondary | ICD-10-CM

## 2020-05-10 DIAGNOSIS — M5136 Other intervertebral disc degeneration, lumbar region: Secondary | ICD-10-CM

## 2020-05-21 ENCOUNTER — Ambulatory Visit
Admission: RE | Admit: 2020-05-21 | Discharge: 2020-05-21 | Disposition: A | Discharge status: Home / Self Care | Payer: 59 | Source: Ambulatory Visit | Attending: Pain Medicine | Admitting: Pain Medicine

## 2020-05-21 ENCOUNTER — Other Ambulatory Visit: Payer: Self-pay

## 2020-05-21 DIAGNOSIS — G8929 Other chronic pain: Secondary | ICD-10-CM

## 2020-06-02 ENCOUNTER — Ambulatory Visit
Admission: RE | Admit: 2020-06-02 | Discharge: 2020-06-02 | Disposition: A | Discharge status: Home / Self Care | Payer: 59 | Source: Ambulatory Visit | Attending: Pain Medicine | Admitting: Pain Medicine

## 2020-06-02 DIAGNOSIS — G8929 Other chronic pain: Secondary | ICD-10-CM

## 2020-06-02 DIAGNOSIS — M549 Dorsalgia, unspecified: Secondary | ICD-10-CM

## 2020-06-02 DIAGNOSIS — M5136 Other intervertebral disc degeneration, lumbar region: Secondary | ICD-10-CM

## 2020-08-28 ENCOUNTER — Other Ambulatory Visit: Payer: Self-pay | Admitting: Obstetrics and Gynecology

## 2020-10-04 ENCOUNTER — Other Ambulatory Visit: Payer: Self-pay | Admitting: Obstetrics and Gynecology

## 2020-12-20 NOTE — H&P (Addendum)
Yvonne Banks is a 50 y.o. female, G:0, presents for hysteroscopy and endometrial ablation because of menorrhagia and uterine fibroids.  For many years the patient reports heavy menstrual periods that have now resulted in a 7-9 day flow and  the need to change double super protection every 2 hours. During these episodes she will have lower back and pelvic cramping rated 8/10 with some relief from Ibuprofen 800 mg.  A pelvic ultrasound last year revealed an anteverted uterus: (7.6 cm from fundus to external os): 5.08 x 4.23 x 4.53 cm, endometrium: 7.4 mm; #4 fibroids: anterior fundal intramural-1.41 cm, posterior left intramural-1.09 cm, anterior intramural- 0.97 cm and ? submucosal fundal - 1.66 cm; right ovary-2.81 cm and left ovary-3.37 cm.  An endometrial biopsy in February 2022 returned benign findings.  The patient was placed on Norethindrone 10 mg daily which caused her "periods" to be intermittent spotting, that only required  a light pad change 3 times a day.  Her cramping intensity remains the same however,  and she has noticed increased migraines and hot flushes.  She denies changes in bowel,  bladder function or  vaginitis symptoms but she does have occasional dyspareunia that is not positional.  A discussion was held with the patient regarding the medical and surgical management options for her symptoms however she desires removal of any endometrial masses followed by ablation for her condition.    Past Medical History  OB History: G: 0  GYN History: menarche: 50YO:    LMP: see HPI;    Contraception: none;  Has a history of HSV-2;  Has a history of abnormal PAP smear in 2019 treated with LEEP and her PAP smears have been normal since.  Last PAP smear-2021 normal with negative HPV  Medical History: Bilateral Knee Osteoarthritis, Osteopenia, Sarcoidosis, Chronic Lower Back Pain, Migraine, Vitamin D Deficiency, GERD  and Uterine Fibroids  Surgical History:  2013 Abdominal Myomectomy; 2014  Cholecystectomy;  and  2019 Loop Electrosurgical Excision Procedure Denies problems with anesthesia or history of blood transfusions  Family History: Hypertension, Colon Cancer, Breast Cancer, Peptic Ulcer Disease, Endometriosis and Pulmonary Hypertension  Social History: Single and employed as Scientist, physiological with [employer];  Denies tobacco use and rarely uses alcohol   Medications: Flonase 1 spray per naris daily Cyclobenzaprine 10 mg po every 6 hours prn Clenpiq 10 mg-3.5 gram-12 gram/160 mL as directed Amitriptyline 25 mg  po qhs Meloxicam 15 mg po pc daily Montelukast 10 mg po daily Norethindrone 5 mg  #2 po daily Pantoprazole 40 mg po  daily Promethazine 25 mg po every 6 hours prn Rizatriptan 10 mg po stat prn Saxenda 3 mg/0.5 mL  SQ injection daily Symbicort 80 mcg-4.5 mcg actuation HFA aerosol inhaler #2 puffs bid Tramadol 50 mg #2  po qid prn Valacyclovir 500 mg po  bid x 3 days prn Venlafaxine 75 mg ER  po daily  Allergies  Allergen Reactions   Other Anaphylaxis    Spicy peppers - anything hot or spicy causes swelling and skin inflammation Cilantro- throat closes, trouble breathing   Pineapple Anaphylaxis    Throat closes, tongue swells   Adhesive [Tape] Other (See Comments)    Causes skin discoloration.   Erythromycin Nausea And Vomiting   Iron     Stomach pain. Pt can take Integra for iron   Latex Other (See Comments)    Discolors skin    Denies sensitivity to peanuts, shellfish or  soy products.  SENSITIVE to latex and adhesives (both  cause rash).   ROS: Admits to glasses, migraines, occasional shortness of breath related to sarcoidosis, bilateral knee pain and swelling but denies vision changes, nasal congestion, dysphagia, tinnitus, dizziness, hoarseness, cough,  chest pain, nausea, vomiting, diarrhea,constipation,  urinary frequency, urgency  dysuria, hematuria, vaginitis symptoms, pelvic pain, swelling of joints,easy bruising,  myalgias, arthralgias, skin  rashes, unexplained weight loss and except as is mentioned in the history of present illness, patient's review of systems is otherwise negative.   Physical Exam  Bp: 116/74;  P: 96 bpm; R: 20;  Temperature: 98.6 degrees F orally; Weight: 268 lbs.; Height: 5'5"; BMI: 44.6;  O2Sat.: 100% (room air)  Neck: supple without masses or thyromegaly Lungs: clear to auscultation Heart: regular rate and rhythm Abdomen: soft, non-tender and no organomegaly Pelvic:EGBUS- wnl; vagina-normal rugae with brown discharge; uterus-normal size (exam limited by habitus), cervix without lesions or motion tenderness; adnexae-no tenderness or masses Extremities:  no clubbing, cyanosis or edema   Assesment: Menorrhagia                      Uterine Fibroids   Disposition:  A discussion was held with patient regarding the indication for her procedure(s) along with the risks, which include but are not limited to: reaction to anesthesia, damage to adjacent organs ( to include perforation), infection, excessive bleeding and continued monthly menstrual periods.  The patient verbalized understanding of these risks and has consented to proceed with Hysteroscopy, Dilatation, Curettage, Myosure and Novasure Endometrial Ablation at [facility] on December 28, 2020.   CSN# [id number]   [place] J. Florene Glen, PA-C  for Dr. Harvie Bridge. Boss Danielsen  Pt with polyp on embx 08/2020 and h/o small fibroids with possibly submucosal fibroid presenting for hysteroscopy D&C myosure and ablation.  Risks benefits and alternatives reviewed with patient including but not limited to bleeding infection and injury.  Questions answered and consent signed and witnessed.  Pt understands future fertility is contraindicated and plans to use condoms.

## 2020-12-25 NOTE — Pre-Procedure Instructions (Addendum)
Surgical Instructions:    Your procedure is scheduled on Friday, June 10th (09:30 AM- 11:01 AM).  Report to [facility] Main Entrance "A" at 07:30 A.M., then check in with the Admitting office.  Call this number if you have any questions prior to, or have any problems the morning of surgery:  [phone]    Remember:  Do not eat or drink after midnight the night before your surgery.     Take these medicines the morning of surgery with A SIP OF WATER: pantoprazole (PROTONIX) carvedilol (COREG)   IF NEEDED: cetirizine (ZYRTEC) promethazine (PHENERGAN) rizatriptan (MAXALT)  tiZANidine (ZANAFLEX) traMADol (ULTRAM) fluticasone (FLONASE) 50 MCG/ACT nasal spray budesonide-formoterol (SYMBICORT) inhaler  Please bring all inhalers with you the day of surgery.    >Do not take Liraglutide -Weight Management (SAXENDA) the morning of your surgery<    As of today, STOP taking any Aspirin (unless otherwise instructed by your surgeon) Aleve, Naproxen, Ibuprofen, Motrin, Advil, Goody's, BC's, all herbal medications, fish oil, and all vitamins.              Special instructions:   [place]- Preparing For Surgery  Before surgery, you can play an important role. Because skin is not sterile, your skin needs to be as free of germs as possible. You can reduce the number of germs on your skin by washing with CHG (chlorahexidine gluconate) Soap before surgery.  CHG is an antiseptic cleaner which kills germs and bonds with the skin to continue killing germs even after washing.    Oral Hygiene is also important to reduce your risk of infection.  Remember - BRUSH YOUR TEETH THE MORNING OF SURGERY WITH YOUR REGULAR TOOTHPASTE  Please do not use if you have an allergy to CHG or antibacterial soaps. If your skin becomes reddened/irritated stop using the CHG.  Do not shave (including legs and underarms) for at least 48 hours prior to first CHG shower. It is OK to shave your face.  Please follow  these instructions carefully.   1. Shower the NIGHT BEFORE SURGERY and the MORNING OF SURGERY  2. If you chose to wash your hair, wash your hair first as usual with your normal shampoo.  3. After you shampoo, rinse your hair and body thoroughly to remove the shampoo.  4. Wash Face and genitals (private parts) with your normal soap.   5. Use CHG Soap as you would any other liquid soap. You can apply CHG directly to the skin and wash gently with a scrungie or a clean washcloth.   6. Apply the CHG Soap to your body ONLY FROM THE NECK DOWN.  Do not use on open wounds or open sores. Avoid contact with your eyes, ears, mouth and genitals (private parts). Wash Face and genitals (private parts)  with your normal soap.   7. Wash thoroughly, paying special attention to the area where your surgery will be performed.  8. Thoroughly rinse your body with warm water from the neck down.  9. DO NOT shower/wash with your normal soap after using and rinsing off the CHG Soap.  10. Pat yourself dry with a CLEAN TOWEL.  11. Wear CLEAN PAJAMAS to bed the night before surgery.  12. Place CLEAN SHEETS on your bed the night before your surgery.  13. DO NOT SLEEP WITH PETS.   Day of Surgery: SHOWER with CHG soap. Brush your teeth WITH YOUR REGULAR TOOTHPASTE. Wear Clean/Comfortable clothing the morning of surgery. Do not apply any deodorants/lotions.   Do  not wear jewelry, make up, or nail polish. Do not shave 48 hours prior to surgery.   Do not bring valuables to the hospital. [facility] is not responsible for any belongings or valuables.   Do NOT Smoke (Tobacco/Vaping) or drink Alcohol 24 hours prior to your procedure.  If you use a CPAP at night, you may bring all equipment for your overnight stay.   Contacts, glasses, or dentures may not be worn into surgery, please bring cases for these belongings.   For patients admitted to the hospital, discharge time will be determined by your treatment  team.   Patients discharged the day of surgery will not be allowed to drive home, and someone needs to stay with them for 24 hours.    Please read over the following fact sheets that you were given.

## 2020-12-26 ENCOUNTER — Encounter (HOSPITAL_COMMUNITY)
Admission: RE | Admit: 2020-12-26 | Discharge: 2020-12-26 | Disposition: A | Discharge status: Home / Self Care | Payer: 59 | Source: Ambulatory Visit | Attending: Obstetrics and Gynecology | Admitting: Obstetrics and Gynecology

## 2020-12-26 ENCOUNTER — Encounter (HOSPITAL_COMMUNITY): Payer: Self-pay

## 2020-12-26 ENCOUNTER — Other Ambulatory Visit: Payer: Self-pay

## 2020-12-26 DIAGNOSIS — Z01818 Encounter for other preprocedural examination: Secondary | ICD-10-CM | POA: Diagnosis not present

## 2020-12-26 DIAGNOSIS — I10 Essential (primary) hypertension: Secondary | ICD-10-CM | POA: Diagnosis not present

## 2020-12-26 HISTORY — DX: Unspecified osteoarthritis, unspecified site: M19.90

## 2020-12-26 HISTORY — DX: Pneumonia, unspecified organism: J18.9

## 2020-12-26 HISTORY — DX: Essential (primary) hypertension: I10

## 2020-12-26 HISTORY — DX: Angina pectoris, unspecified: I20.9

## 2020-12-26 LAB — BASIC METABOLIC PANEL
Anion gap: 7 (ref 5–15)
BUN: 9 mg/dL (ref 6–20)
CO2: 27 mmol/L (ref 22–32)
Calcium: 9.4 mg/dL (ref 8.9–10.3)
Chloride: 104 mmol/L (ref 98–111)
Creatinine, Ser: 1.01 mg/dL — ABNORMAL HIGH (ref 0.44–1.00)
GFR, Estimated: >60 mL/min (ref >60)
Glucose, Bld: 90 mg/dL (ref 70–99)
Potassium: 3.5 mmol/L (ref 3.5–5.1)
Sodium: 138 mmol/L (ref 135–145)

## 2020-12-26 LAB — CBC
HCT: 41.1 % (ref 36.0–46.0)
Hemoglobin: 13.4 g/dL (ref 12.0–15.0)
MCH: 28 pg (ref 26.0–34.0)
MCHC: 32.6 g/dL (ref 30.0–36.0)
MCV: 85.8 fL (ref 80.0–100.0)
Platelets: 401 10*3/uL — ABNORMAL HIGH (ref 150–400)
RBC: 4.79 MIL/uL (ref 3.87–5.11)
RDW: 13.5 % (ref 11.5–15.5)
WBC: 11.1 10*3/uL — ABNORMAL HIGH (ref 4.0–10.5)
nRBC: 0 % (ref 0.0–0.2)

## 2020-12-26 NOTE — Progress Notes (Signed)
PCP - Jonathon Jordan, MD Cardiologist - Denies Pulmonologist- Chesley Mires, MD Endocrinologist- Madelin Rear, MD Rheumatologist- Sabino Niemann, MD  PPM/ICD - Denies  Chest x-ray - N/A EKG - 12/26/20 Stress Test - Denies ECHO - Denies Cardiac Cath - Denies  Sleep Study - Denies  Pt denies being diabetic.  Blood Thinner Instructions: N/A Aspirin Instructions: N/A  ERAS Protcol - N/A PRE-SURGERY Ensure or G2- N/A  COVID TEST- N/A; Procedure posted as Ambulatory Surgery   Anesthesia review: Yes, pulmonary hx  Patient denies shortness of breath, fever, cough and chest pain at PAT appointment   All instructions explained to the patient, with a verbal understanding of the material. Patient agrees to go over the instructions while at home for a better understanding. Patient also instructed to self quarantine after being tested for COVID-19. The opportunity to ask questions was provided.

## 2020-12-27 NOTE — Anesthesia Preprocedure Evaluation (Addendum)
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 Halford Chessman. When seen 03/10/19, it was noted that lung pathology had "essentially resolved on recent imaging studies." She continues to have some DOE and intermittent chest discomfort with cough. Symptoms reportedly improve when using Symbicort   Pulmonary exam normal breath sounds clear to auscultation       Cardiovascular hypertension, Pt. on home beta blockers and Pt. on medications Normal cardiovascular exam Rhythm:Regular Rate:Normal  Echo 2019 (sarcoid w/u): - Left ventricle: The cavity size was normal. There was mild  concentric hypertrophy. Systolic function was vigorous. The  estimated ejection fraction was in the range of 65% to 70%. Wall  motion was normal; there were no regional wall motion  abnormalities. Doppler parameters are consistent with abnormal  left ventricular relaxation (grade 1 diastolic dysfunction).  - Aortic valve: There was no regurgitation.  - Aortic root: The aortic root was normal in size.  - Right atrium: The atrium was normal in size.  - Tricuspid valve: There was no regurgitation.  - Pulmonary arteries: Systolic pressure could not be accurately  estimated.  - Inferior vena cava: The  vessel was normal in size. The  respirophasic diameter changes were in the normal range (= 50%),  consistent with normal central venous pressure.  - Pericardium, extracardiac: There was no pericardial effusion.    Neuro/Psych  Headaches, PSYCHIATRIC DISORDERS Anxiety Depression    GI/Hepatic Neg liver ROS, GERD  Controlled,  Endo/Other  Morbid obesityBMI 46  Renal/GU negative Renal ROS  Female GU complaint     Musculoskeletal  (+) Arthritis , Osteoarthritis,    Abdominal (+) + obese,   Peds  Hematology negative hematology ROS (+) hct 41   Anesthesia Other Findings   Reproductive/Obstetrics negative OB ROS                           Anesthesia Physical Anesthesia Plan  ASA: 3  Anesthesia Plan: General   Post-op Pain Management:    Induction: Intravenous  PONV Risk Score and Plan: 4 or greater and Ondansetron, Dexamethasone, Midazolam, Scopolamine patch - Pre-op and Treatment may vary due to age or medical condition  Airway Management Planned: LMA  Additional Equipment: None  Intra-op Plan:   Post-operative Plan: Extubation in OR  Informed Consent: I have reviewed the patients History and Physical, chart, labs and discussed the procedure including the risks, benefits and alternatives for the proposed anesthesia with the patient or authorized representative who has indicated his/her understanding and acceptance.     Dental advisory given  Plan Discussed with: CRNA  Anesthesia Plan Comments: ( )      Anesthesia Quick Evaluation

## 2020-12-27 NOTE — Progress Notes (Signed)
Anesthesia Chart Review:  Hx of sarcoidosis and asthma, followed by pulmonologist Dr. Halford Chessman. When seen 03/10/19, it was noted that lung pathology had "essentially resolved on recent imaging studies." She continues to have some DOE and intermittent chest discomfort with cough. Symptoms reportedly improve when using Symbicort. Echo 03/2018 showed EF 65-70%, grade 1 dd, normal valves.  Preop labs reviewed, unremarkable.   EKG 12/26/20: NSR. Rate 91.  PFT 01/19/18: FVC-%Pred-Pre Latest Units: % 75  FEV1-%Pred-Pre Latest Units: % 82  FEV1FVC-%Pred-Pre Latest Units: % 107  TLC % pred Latest Units: % 80  RV % pred Latest Units: % 90  DLCO unc % pred Latest Units: % 63   CT Chest 03/09/19: IMPRESSION: 1. There has been near complete resolution of right upper lobe opacity and nodularity seen on prior examination, in particular a subpleural rounded opacity of the posterior right upper lobe which on prior examination dated 03/01/2018 measured up to 1.0 cm. Findings are consistent with sequelae of resolved infection or inflammation.   2. Minimal segmental air trapping on expiratory phase imaging, suggestive of small airways disease.  TTE 04/08/2018: - Left ventricle: The cavity size was normal. There was mild    concentric hypertrophy. Systolic function was vigorous. The    estimated ejection fraction was in the range of 65% to 70%. Wall    motion was normal; there were no regional wall motion    abnormalities. Doppler parameters are consistent with abnormal    left ventricular relaxation (grade 1 diastolic dysfunction).  - Aortic valve: There was no regurgitation.  - Aortic root: The aortic root was normal in size.  - Right atrium: The atrium was normal in size.  - Tricuspid valve: There was no regurgitation.  - Pulmonary arteries: Systolic pressure could not be accurately    estimated.  - Inferior vena cava: The vessel was normal in size. The    respirophasic diameter changes were in the  normal range (= 50%),    consistent with normal central venous pressure.  - Pericardium, extracardiac: There was no pericardial effusion.    Wynonia Musty [facility]/Anesthesiology Phone [phone] 12/27/2020 8:00 AM

## 2020-12-28 ENCOUNTER — Ambulatory Visit (HOSPITAL_COMMUNITY)
Admission: RE | Admit: 2020-12-28 | Discharge: 2020-12-28 | Disposition: A | Discharge status: Home / Self Care | Payer: 59 | Attending: Obstetrics and Gynecology | Admitting: Obstetrics and Gynecology

## 2020-12-28 ENCOUNTER — Ambulatory Visit (HOSPITAL_COMMUNITY): Payer: 59 | Admitting: Physician Assistant

## 2020-12-28 ENCOUNTER — Encounter (HOSPITAL_COMMUNITY)
Admission: RE | Disposition: A | Discharge status: Home / Self Care | Payer: Self-pay | Source: Home / Self Care | Attending: Obstetrics and Gynecology

## 2020-12-28 ENCOUNTER — Other Ambulatory Visit: Payer: Self-pay

## 2020-12-28 ENCOUNTER — Encounter (HOSPITAL_COMMUNITY): Payer: Self-pay | Admitting: Obstetrics and Gynecology

## 2020-12-28 DIAGNOSIS — Z888 Allergy status to other drugs, medicaments and biological substances status: Secondary | ICD-10-CM | POA: Diagnosis not present

## 2020-12-28 DIAGNOSIS — Z91040 Latex allergy status: Secondary | ICD-10-CM | POA: Insufficient documentation

## 2020-12-28 DIAGNOSIS — Z791 Long term (current) use of non-steroidal anti-inflammatories (NSAID): Secondary | ICD-10-CM | POA: Diagnosis not present

## 2020-12-28 DIAGNOSIS — Z91048 Other nonmedicinal substance allergy status: Secondary | ICD-10-CM | POA: Diagnosis not present

## 2020-12-28 DIAGNOSIS — Z8379 Family history of other diseases of the digestive system: Secondary | ICD-10-CM | POA: Diagnosis not present

## 2020-12-28 DIAGNOSIS — Z8249 Family history of ischemic heart disease and other diseases of the circulatory system: Secondary | ICD-10-CM | POA: Insufficient documentation

## 2020-12-28 DIAGNOSIS — Z7951 Long term (current) use of inhaled steroids: Secondary | ICD-10-CM | POA: Insufficient documentation

## 2020-12-28 DIAGNOSIS — N920 Excessive and frequent menstruation with regular cycle: Secondary | ICD-10-CM | POA: Diagnosis present

## 2020-12-28 DIAGNOSIS — Z800 Family history of malignant neoplasm of digestive organs: Secondary | ICD-10-CM | POA: Insufficient documentation

## 2020-12-28 DIAGNOSIS — Z79899 Other long term (current) drug therapy: Secondary | ICD-10-CM | POA: Diagnosis not present

## 2020-12-28 DIAGNOSIS — N840 Polyp of corpus uteri: Secondary | ICD-10-CM | POA: Diagnosis not present

## 2020-12-28 DIAGNOSIS — Z803 Family history of malignant neoplasm of breast: Secondary | ICD-10-CM | POA: Diagnosis not present

## 2020-12-28 DIAGNOSIS — Z91018 Allergy to other foods: Secondary | ICD-10-CM | POA: Insufficient documentation

## 2020-12-28 DIAGNOSIS — D869 Sarcoidosis, unspecified: Secondary | ICD-10-CM | POA: Diagnosis not present

## 2020-12-28 DIAGNOSIS — Z881 Allergy status to other antibiotic agents status: Secondary | ICD-10-CM | POA: Insufficient documentation

## 2020-12-28 DIAGNOSIS — K219 Gastro-esophageal reflux disease without esophagitis: Secondary | ICD-10-CM | POA: Diagnosis not present

## 2020-12-28 HISTORY — PX: DILITATION & CURRETTAGE/HYSTROSCOPY WITH NOVASURE ABLATION: SHX5568

## 2020-12-28 HISTORY — PX: DILATATION & CURETTAGE/HYSTEROSCOPY WITH MYOSURE: SHX6511

## 2020-12-28 LAB — POCT PREGNANCY, URINE: Preg Test, Ur: NEGATIVE

## 2020-12-28 SURGERY — DILATATION & CURETTAGE/HYSTEROSCOPY WITH NOVASURE ABLATION
Anesthesia: General

## 2020-12-28 MED ORDER — 0.9 % SODIUM CHLORIDE (POUR BTL) OPTIME
TOPICAL | Status: DC | PRN
  Administered 2020-12-28: 1000 mL

## 2020-12-28 MED ORDER — SODIUM CHLORIDE 0.9 % IR SOLN
Status: DC | PRN
  Administered 2020-12-28: 3000 mL

## 2020-12-28 MED ORDER — OXYCODONE HCL 5 MG PO TABS
ORAL_TABLET | ORAL | Status: AC
  Filled 2020-12-28: qty 1

## 2020-12-28 MED ORDER — ORAL CARE MOUTH RINSE: 15.0000 mL | Freq: Once | OROMUCOSAL | Status: AC

## 2020-12-28 MED ORDER — LACTATED RINGERS IV SOLN: INTRAVENOUS | Status: DC

## 2020-12-28 MED ORDER — LIDOCAINE HCL (PF) 1 % IJ SOLN
INTRAMUSCULAR | Status: AC
  Filled 2020-12-28: qty 30

## 2020-12-28 MED ORDER — ACETAMINOPHEN 500 MG PO TABS
1000.0000 mg | ORAL_TABLET | Freq: Once | ORAL | Status: AC
  Administered 2020-12-28: 1000 mg via ORAL
  Filled 2020-12-28: qty 2

## 2020-12-28 MED ORDER — MIDAZOLAM HCL 5 MG/5ML IJ SOLN
INTRAMUSCULAR | Status: DC | PRN
  Administered 2020-12-28: 2 mg via INTRAVENOUS

## 2020-12-28 MED ORDER — HYDROMORPHONE HCL 1 MG/ML IJ SOLN
INTRAMUSCULAR | Status: AC
  Filled 2020-12-28: qty 1

## 2020-12-28 MED ORDER — PROPOFOL 10 MG/ML IV BOLUS
INTRAVENOUS | Status: DC | PRN
  Administered 2020-12-28: 200 mg via INTRAVENOUS

## 2020-12-28 MED ORDER — FENTANYL CITRATE (PF) 250 MCG/5ML IJ SOLN
INTRAMUSCULAR | Status: AC
  Filled 2020-12-28: qty 5

## 2020-12-28 MED ORDER — MIDAZOLAM HCL 2 MG/2ML IJ SOLN
INTRAMUSCULAR | Status: AC
  Filled 2020-12-28: qty 2

## 2020-12-28 MED ORDER — LIDOCAINE HCL 1 % IJ SOLN
INTRAMUSCULAR | Status: DC | PRN
  Administered 2020-12-28: 10 mL

## 2020-12-28 MED ORDER — CHLORHEXIDINE GLUCONATE 0.12 % MT SOLN
15.0000 mL | Freq: Once | OROMUCOSAL | Status: AC
  Administered 2020-12-28: 15 mL via OROMUCOSAL
  Filled 2020-12-28: qty 15

## 2020-12-28 MED ORDER — HYDROMORPHONE HCL 1 MG/ML IJ SOLN
0.2500 mg | INTRAMUSCULAR | Status: DC | PRN
  Administered 2020-12-28 (×2): 0.5 mg via INTRAVENOUS

## 2020-12-28 MED ORDER — MEPERIDINE HCL 25 MG/ML IJ SOLN: 6.2500 mg | INTRAMUSCULAR | Status: DC | PRN

## 2020-12-28 MED ORDER — ONDANSETRON HCL 4 MG/2ML IJ SOLN
INTRAMUSCULAR | Status: DC | PRN
  Administered 2020-12-28: 4 mg via INTRAVENOUS

## 2020-12-28 MED ORDER — OXYCODONE HCL 5 MG/5ML PO SOLN
5.0000 mg | Freq: Once | ORAL | Status: AC | PRN
Start: 2020-12-28 — End: 2020-12-28

## 2020-12-28 MED ORDER — OXYCODONE HCL 5 MG PO TABS
5.0000 mg | ORAL_TABLET | Freq: Once | ORAL | Status: AC | PRN
  Administered 2020-12-28: 5 mg via ORAL

## 2020-12-28 MED ORDER — PROMETHAZINE HCL 25 MG/ML IJ SOLN: 6.2500 mg | INTRAMUSCULAR | Status: DC | PRN

## 2020-12-28 MED ORDER — CEFAZOLIN IN SODIUM CHLORIDE 3-0.9 GM/100ML-% IV SOLN
3.0000 g | INTRAVENOUS | Status: AC
  Administered 2020-12-28: 3 g via INTRAVENOUS
  Filled 2020-12-28: qty 100

## 2020-12-28 MED ORDER — LIDOCAINE HCL (CARDIAC) PF 100 MG/5ML IV SOSY
PREFILLED_SYRINGE | INTRAVENOUS | Status: DC | PRN
  Administered 2020-12-28: 60 mg via INTRAVENOUS

## 2020-12-28 MED ORDER — OXYCODONE-ACETAMINOPHEN 2.5-325 MG PO TABS: 1.0000 | ORAL_TABLET | ORAL | 0 refills | Status: DC | PRN

## 2020-12-28 MED ORDER — KETOROLAC TROMETHAMINE 30 MG/ML IJ SOLN
INTRAMUSCULAR | Status: DC | PRN
  Administered 2020-12-28: 30 mg via INTRAVENOUS

## 2020-12-28 MED ORDER — SAXENDA 18 MG/3ML ~~LOC~~ SOPN: 3.0000 mg | PEN_INJECTOR | Freq: Every day | SUBCUTANEOUS | 0 refills | Status: DC

## 2020-12-28 MED ORDER — FENTANYL CITRATE (PF) 100 MCG/2ML IJ SOLN
INTRAMUSCULAR | Status: DC | PRN
  Administered 2020-12-28 (×2): 50 ug via INTRAVENOUS

## 2020-12-28 MED ORDER — POVIDONE-IODINE 10 % EX SWAB
2.0000 "application " | Freq: Once | CUTANEOUS | Status: AC
  Administered 2020-12-28: 2 via TOPICAL

## 2020-12-28 MED ORDER — SCOPOLAMINE 1 MG/3DAYS TD PT72
1.0000 | MEDICATED_PATCH | TRANSDERMAL | Status: DC
  Administered 2020-12-28: 1.5 mg via TRANSDERMAL
  Filled 2020-12-28: qty 1

## 2020-12-28 MED ORDER — DEXAMETHASONE SODIUM PHOSPHATE 10 MG/ML IJ SOLN
INTRAMUSCULAR | Status: DC | PRN
  Administered 2020-12-28: 10 mg via INTRAVENOUS

## 2020-12-28 MED ORDER — KETOROLAC TROMETHAMINE 30 MG/ML IJ SOLN: 30.0000 mg | Freq: Once | INTRAMUSCULAR | Status: DC | PRN

## 2020-12-28 SURGICAL SUPPLY — 22 items
ABLATOR SURESOUND NOVASURE (ABLATOR) ×3 IMPLANT
CANISTER SUCT 3000ML PPV (MISCELLANEOUS) ×3 IMPLANT
CATH FOLEY LATEX FREE 14FR (CATHETERS) ×3
CATH FOLEY LF 14FR (CATHETERS) IMPLANT
CATH ROBINSON RED A/P 16FR (CATHETERS) ×1 IMPLANT
DEVICE MYOSURE LITE (MISCELLANEOUS) IMPLANT
DEVICE MYOSURE REACH (MISCELLANEOUS) ×2 IMPLANT
GAUZE 4X4 16PLY RFD (DISPOSABLE) ×2 IMPLANT
GLOVE BIO SURGEON STRL SZ7.5 (GLOVE) ×3 IMPLANT
GLOVE BIOGEL PI IND STRL 7.5 (GLOVE) ×1 IMPLANT
GLOVE BIOGEL PI INDICATOR 7.5 (GLOVE) ×4
GLOVE SURG UNDER POLY LF SZ7 (GLOVE) ×3 IMPLANT
GOWN STRL REUS W/ TWL LRG LVL3 (GOWN DISPOSABLE) ×2 IMPLANT
GOWN STRL REUS W/TWL LRG LVL3 (GOWN DISPOSABLE) ×9
KIT PROCEDURE FLUENT (KITS) ×3 IMPLANT
KIT TURNOVER KIT B (KITS) ×3 IMPLANT
NS IRRIG 1000ML POUR BTL (IV SOLUTION) ×2 IMPLANT
PACK VAGINAL MINOR WOMEN LF (CUSTOM PROCEDURE TRAY) ×3 IMPLANT
PAD OB MATERNITY 4.3X12.25 (PERSONAL CARE ITEMS) ×3 IMPLANT
SEAL ROD LENS SCOPE MYOSURE (ABLATOR) ×3 IMPLANT
TOWEL GREEN STERILE FF (TOWEL DISPOSABLE) ×6 IMPLANT
UNDERPAD 30X36 HEAVY ABSORB (UNDERPADS AND DIAPERS) ×3 IMPLANT

## 2020-12-28 NOTE — Anesthesia Postprocedure Evaluation (Signed)
Anesthesia Post Note  Patient: Edwinna Areola  Procedure(s) Performed: DILATATION & CURETTAGE/HYSTEROSCOPY WITH [place]     Patient location during evaluation: PACU Anesthesia Type: General Level of consciousness: awake and alert, oriented and patient cooperative Pain management: pain level controlled Vital Signs Assessment: post-procedure vital signs reviewed and stable Respiratory status: spontaneous breathing, nonlabored ventilation and respiratory function stable Cardiovascular status: blood pressure returned to baseline and stable Postop Assessment: no apparent nausea or vomiting Anesthetic complications: no   No notable events documented.  Last Vitals:  Vitals:   12/28/20 1115 12/28/20 1127  BP:  119/67  Pulse: 89 93  Resp: 18 18  Temp:    SpO2: 97% 97%                  Pervis Hocking

## 2020-12-28 NOTE — Op Note (Addendum)
Preop Diagnosis: 1.Menorrhagia 2.Polyp 3.Submucosal   Postop Diagnosis: 1.Menorrhagia 2.Polyp 3.Submucosal   Procedure:  1.Hysteroscopy 2.D&C 3.Myosure 4.Novasure Ablation  Anesthesia: General   Anesthesiologist: Pervis Hocking, DO   Attending: Everett Graff, MD   Assistant: N/A  Findings: 1.Large polyp and possible submucosal fibroid 2.Cavity length 6cm 3.Cavity width 4.1cm 4.135 watts 5.37sec  Pathology: 1.Endometrial Curettings 2.Polyp 3.Resected portion of mass  Fluids: 500 cc  UOP: 30 cc  EBL: 5 cc  Complications: None  Procedure:  The patient was taken to the operating room after the risks, benefits and alternatives discussed with the patient. The patient verbalized understanding and consent signed and witnessed. The patient was placed under general anesthesia and prepped and draped in the normal sterile fashion and Time Out performed per protocol. A bivalve speculum was placed in the patient's vagina and the anterior lip of the cervix was grasped with a single tooth tenaculum. A paracervical block was administered using a total of 10 cc of 1% lidocaine. The uterus sounded to 9 cm. The cervix was dilated for passage of the hysteroscope. The hysteroscope was introduced into the uterine cavity and findings as noted above. Sharp curettage was performed until a gritty texture was noted and curettings sent to pathology. Myosure and polyp forceps used to remove polyp and resect submucosal mass.  The hysteroscope was reintroduced and no obvious remaining intracavitary lesions were noted. The Novasure instrument was introduced and ablation performed without difficulty. Hysteroscope reintroduced and good ablation results were noted. All instruments were removed. Sponge lap and needle count was correct. The patient tolerated the procedure well and was returned to the recovery room in good condition.

## 2020-12-28 NOTE — Transfer of Care (Signed)
Immediate Anesthesia Transfer of Care Note  Patient: Yvonne Banks  Procedure(s) Performed: DILATATION & CURETTAGE/HYSTEROSCOPY WITH [place]  Patient Location: PACU  Anesthesia Type:General  Level of Consciousness: awake, alert  and oriented  Airway & Oxygen Therapy: Patient Spontanous Breathing and Patient connected to face mask oxygen  Post-op Assessment: Report given to RN and Post -op Vital signs reviewed and stable  Post vital signs: Reviewed and stable  Last Vitals:  Vitals Value Taken Time  BP 142/83 12/28/20 1045  Temp 36.7 C 12/28/20 1045  Pulse 96 12/28/20 1053  Resp 18 12/28/20 1053  SpO2 96 % 12/28/20 1053  Vitals shown include unvalidated device data.  Last Pain:  Vitals:   12/28/20 1045  TempSrc:   PainSc: 10-Worst pain ever         Complications: No notable events documented.

## 2020-12-28 NOTE — Anesthesia Procedure Notes (Signed)
Procedure Name: LMA Insertion Date/Time: 12/28/2020 9:55 AM Performed by: Griffin Dakin, CRNA Pre-anesthesia Checklist: Patient identified, Suction available, Emergency Drugs available, Patient being monitored and Timeout performed Patient Re-evaluated:Patient Re-evaluated prior to induction Oxygen Delivery Method: Circle system utilized Preoxygenation: Pre-oxygenation with 100% oxygen Induction Type: IV induction Ventilation: Mask ventilation without difficulty LMA: LMA inserted LMA Size: 4.0 Number of attempts: 1 Placement Confirmation: positive ETCO2 and breath sounds checked- equal and bilateral Tube secured with: Tape Dental Injury: Teeth and Oropharynx as per pre-operative assessment

## 2020-12-29 ENCOUNTER — Encounter (HOSPITAL_COMMUNITY): Payer: Self-pay | Admitting: Obstetrics and Gynecology

## 2020-12-31 LAB — SURGICAL PATHOLOGY

## 2021-10-09 ENCOUNTER — Telehealth: Payer: Self-pay | Admitting: Pulmonary Disease

## 2021-10-09 NOTE — Telephone Encounter (Signed)
Called patient but she did not answer. Left message for her to call back.  

## 2021-11-18 ENCOUNTER — Ambulatory Visit: Payer: 59 | Admitting: Pulmonary Disease

## 2021-11-18 ENCOUNTER — Ambulatory Visit (INDEPENDENT_AMBULATORY_CARE_PROVIDER_SITE_OTHER): Payer: 59 | Admitting: Pulmonary Disease

## 2021-11-18 ENCOUNTER — Ambulatory Visit (INDEPENDENT_AMBULATORY_CARE_PROVIDER_SITE_OTHER): Payer: 59

## 2021-11-18 ENCOUNTER — Encounter: Payer: Self-pay | Admitting: Pulmonary Disease

## 2021-11-18 VITALS — BP 110/64 | HR 98 | Temp 98.2°F | Ht 65.0 in | Wt 284.2 lb

## 2021-11-18 DIAGNOSIS — J4551 Severe persistent asthma with (acute) exacerbation: Secondary | ICD-10-CM

## 2021-11-18 LAB — CBC WITH DIFFERENTIAL/PLATELET
Basophils Absolute: 0 10*3/uL (ref 0.0–0.1)
Basophils Relative: 0.4 % (ref 0.0–3.0)
Eosinophils Absolute: 0.1 10*3/uL (ref 0.0–0.7)
Eosinophils Relative: 1.5 % (ref 0.0–5.0)
HCT: 39.6 % (ref 36.0–46.0)
Hemoglobin: 13.1 g/dL (ref 12.0–15.0)
Lymphocytes Relative: 34.9 % (ref 12.0–46.0)
Lymphs Abs: 2.9 10*3/uL (ref 0.7–4.0)
MCHC: 33 g/dL (ref 30.0–36.0)
MCV: 84.9 fl (ref 78.0–100.0)
Monocytes Absolute: 0.5 10*3/uL (ref 0.1–1.0)
Monocytes Relative: 6.3 % (ref 3.0–12.0)
Neutro Abs: 4.7 10*3/uL (ref 1.4–7.7)
Neutrophils Relative %: 56.9 % (ref 43.0–77.0)
Platelets: 332 10*3/uL (ref 150.0–400.0)
RBC: 4.67 Mil/uL (ref 3.87–5.11)
RDW: 14.5 % (ref 11.5–15.5)
WBC: 8.3 10*3/uL (ref 4.0–10.5)

## 2021-11-18 LAB — COMPREHENSIVE METABOLIC PANEL
ALT: 13 U/L (ref 0–35)
AST: 14 U/L (ref 0–37)
Albumin: 3.9 g/dL (ref 3.5–5.2)
Alkaline Phosphatase: 88 U/L (ref 39–117)
BUN: 9 mg/dL (ref 6–23)
CO2: 27 mEq/L (ref 19–32)
Calcium: 9.3 mg/dL (ref 8.4–10.5)
Chloride: 104 mEq/L (ref 96–112)
Creatinine, Ser: 0.99 mg/dL (ref 0.40–1.20)
GFR: 67.02 mL/min (ref >60.00)
Glucose, Bld: 98 mg/dL (ref 70–99)
Potassium: 4.1 mEq/L (ref 3.5–5.1)
Sodium: 139 mEq/L (ref 135–145)
Total Bilirubin: 0.4 mg/dL (ref 0.2–1.2)
Total Protein: 7.3 g/dL (ref 6.0–8.3)

## 2021-11-18 MED ORDER — BUDESONIDE-FORMOTEROL FUMARATE 160-4.5 MCG/ACT IN AERO: 2.0000 | INHALATION_SPRAY | Freq: Two times a day (BID) | RESPIRATORY_TRACT | 6 refills | Status: DC

## 2021-11-18 MED ORDER — ALBUTEROL SULFATE HFA 108 (90 BASE) MCG/ACT IN AERS: 2.0000 | INHALATION_SPRAY | Freq: Four times a day (QID) | RESPIRATORY_TRACT | 6 refills | Status: DC | PRN

## 2021-11-18 NOTE — Patient Instructions (Signed)
Chest xray and lab tests today. ? ?Symbicort two puffs twice per day, and rinse your mouth after each use. ? ?Albuterol two puffs every 6 hours as needed for cough, wheeze, chest congestion, or shortness of breath.  You can use this prior to exercising also as needed. ? ?Flonase 1 spray in each nostril daily for 2 weeks, then as needed. ? ?Follow up in 6 weeks. ? ?

## 2021-11-18 NOTE — Progress Notes (Signed)
? ?[facility] ? ?Chief Complaint  ?Patient presents with  ? Follow-up  ?  C/o sob x 2 mths.,dry cough, wheeze  ? ? ?Constitutional:  ?BP 110/64 (BP Location: Right Arm, Cuff Size: Large)   Pulse 98   Temp 98.2 ?F (36.8 ?C) (Temporal)   Ht '5\' 5"'$  (1.651 m)   Wt 284 lb 3.2 oz (128.9 kg)   SpO2 96%   BMI 47.29 kg/m?  ? ?Past Medical History:  ?Anemia, Depression, Uterine Fibroids, Gallstones, GERD, HSV, IBS, Migraine HA, Vit D deficiency ? ?Past Surgical History:  ?Her  has a past surgical history that includes [place] tooth extraction; Dilatation & currettage/hysteroscopy with resectoscope (N/A, 11/05/2012); skyla iud (01-14-13); Cholecystectomy (N/A, 05/19/2013); IUD removal; Bladder surgery; Dilation and curettage of uterus (11/2013); Myomectomy (N/A, 02/17/2014); Video bronchoscopy (Bilateral, 12/04/2017); LEEP (N/A, 02/26/2018); Diagnostic laparoscopy; Dilatation & currettage/hysteroscopy with novasure ablation (N/A, 12/28/2020); and Dilatation & curettage/hysteroscopy with myosure (N/A, 12/28/2020). ? ?Brief Summary:  ?Yvonne Banks is a 51 y.o. female former smoker with asthma.  She also has history of sarcoidosis. ?  ? ? ? ?Subjective:  ? ?I last saw her in 2021. ? ?She hasn't been on inhalers for about 1 year until recently. ? ?She gets cough and chest tight when exercising. ? ?She got a bronchitis and sinus infection a few weeks ago.  Was treated with steroid shot, prednisone, zithromax, and advair for 10 days.  Improved some.  No longer having fever or producing sputum.  Sinus congestion some better.  Still gets short of breath with temperature changes, talking, or exercising.   ? ?She has lumps on her neck.  Got antibiotic cream but didn't help.  She has appointment with dermatology next week.  Has also been getting more acne. ? ?Physical Exam:  ? ? ?Appearance - well kempt  ? ?ENMT - no sinus tenderness, no oral exudate, no LAN, Mallampati 3 airway, no stridor, 2+  tonsils ? ?Respiratory - equal breath sounds bilaterally, no wheezing or rales ? ?CV - s1s2 regular rate and rhythm, no murmurs ? ?Ext - no clubbing, no edema ? ?Skin - acne on her chin, scarring in neck area ? ?Psych - normal mood and affect ? ?  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 ?Chest Imaging:  ?CT angio chest 12/03/17 >> borderline LAN, mass like consolidation in Rt upper and lower lobes ?CT chest 03/01/18 >> decreased size of apical opacities ?HRCT chest 06/04/18 >> decreased nodularity ?HRCT chest 03/09/19 >> near complete resolution of RUL opacity and nodularity with minimal bandlike scarring, minimal air trapping ? ?Cardiac Tests:  ?Echo 04/08/18 >> EF 65 to 70%, grade 1 DD ? ?Social History:  ?She  reports that she quit smoking about 21 years ago. Her smoking use included cigarettes. She has a 15.00 pack-year smoking history. She has never used smokeless tobacco. She reports current alcohol use. She reports that she does not use drugs. ? ?Family History:  ?Her family history includes Allergies in her maternal aunt, mother, and sister; Anemia in her mother; Asthma in her maternal aunt and mother; Breast cancer in her paternal aunt; Cancer in her maternal grandmother; Hypertension in her mother; Lung cancer in her cousin; Ulcers in her sister. ?  ? ? ?Assessment/Plan:  ? ?Allergic asthma. ?-  severe, persistent with recent exacerbation ?- resume symbicort 160 two puffs bid ?- prn albuterol ?- don't think she needs additional prednisone or Abx at this time ?- check CXR, CMET, CBC with diff, IgE ?  ?Upper airway cough. ?- flonase daily for 2 weeks then prn ? ?Exercised induced asthma. ?- prn albuterol prior to exercising ? ?Hx of sarcoidosis. ?- repeat chest xray ?  ?Obesity. ?- explained how her weight and  deconditioning can contribute to her shortness of breath ?- if her breathing symptoms get worse, then will need repeat testing: Labs, CXR, PFT ? ?Time Spent Involved in Patient Care on Day of Examination:  ?36 minutes ? ?Follow up:  ? ?Patient Instructions  ?Chest xray and lab tests today. ? ?Symbicort two puffs twice per day, and rinse your mouth after each use. ? ?Albuterol two puffs every 6 hours as needed for cough, wheeze, chest congestion, or shortness of breath.  You can use this prior to exercising also as needed. ? ?Flonase 1 spray in each nostril daily for 2 weeks, then as needed. ? ?Follow up in 6 weeks. ? ? ?Medication List:  ? ?Allergies as of 11/18/2021   ? ?   Reactions  ? Other Anaphylaxis  ? Spicy peppers - anything hot or spicy causes swelling and skin inflammation ?Cilantro- throat closes, trouble breathing  ? Pineapple Anaphylaxis  ? Throat closes, tongue swells  ? Latex Other (See Comments), Rash  ? *Adhesive Tape  ?Discolors skin ?Discolors skin  ? Adhesive [tape] Other (See Comments)  ? Causes skin discoloration.  ? Erythromycin Nausea And Vomiting  ? Other reaction(s): vomiting, Can Take Zpak  ? Iron   ? Stomach pain. Pt can take Integra for iron  ? ?  ? ?  ?Medication List  ?  ? ?  ? Accurate as of Nov 18, 2021  9:32 AM. If you have any questions, ask your nurse or doctor.  ?  ?  ? ?  ? ?STOP taking these medications   ? ?budesonide-formoterol 80-4.5 MCG/ACT inhaler ?Commonly known as: Symbicort ?Replaced by: budesonide-formoterol 160-4.5 MCG/ACT inhaler ?Stopped by: Chesley Mires, MD ?  ? ?  ? ?TAKE these medications   ? ?albuterol 108 (90 Base) MCG/ACT inhaler ?Commonly known as: VENTOLIN HFA ?Inhale 2 puffs into the lungs every 6 (six) hours as needed for wheezing or shortness of breath. ?Started by: Chesley Mires, MD ?  ?azelastine 0.1 % nasal spray ?Commonly known as: ASTELIN ?Place into both nostrils 2 (two) times daily. Use in each nostril as directed ?  ?budesonide-formoterol 160-4.5  MCG/ACT inhaler ?Commonly known as: Symbicort ?Inhale 2 puffs into the lungs 2 (two) times daily. ?Replaces: budesonide-formoterol 80-4.5 MCG/ACT inhaler ?Started by: Chesley Mires, MD ?  ?carvedilol 3.125 MG tablet ?Commonly known as: COREG ?Take 3.125 mg by mouth 2 (two) times daily with a meal. ?  ?cetirizine 10 MG tablet ?Commonly known as: ZYRTEC ?Take 10 mg by mouth daily as needed for allergies. ?  ?fluticasone 50 MCG/ACT nasal spray ?Commonly known as: FLONASE ?Place 2 sprays into both nostrils daily. ?  ?NON FORMULARY ?Take 4 sprays by mouth daily. Vitamin C drops with elderberry and zinc ?  ?oxycodone-acetaminophen 2.5-325 MG tablet ?Commonly known as: Percocet ?Take 1 tablet by mouth every 4 (four) hours as needed for pain. ?  ?pantoprazole 40 MG tablet ?Commonly known as: PROTONIX ?Take 40 mg by mouth daily. ?  ?promethazine 25 MG tablet ?Commonly known as: PHENERGAN ?Take 25 mg by mouth every  6 (six) hours as needed for nausea or vomiting. ?  ?rizatriptan 10 MG tablet ?Commonly known as: MAXALT ?Take 10 mg by mouth as needed for migraine. ?  ?Saxenda 18 MG/3ML Sopn ?Generic drug: Liraglutide -Weight Management ?Inject 3 mg into the skin at bedtime. ?  ?tiZANidine 4 MG tablet ?Commonly known as: ZANAFLEX ?Take 2 mg by mouth every 8 (eight) hours as needed for migraine. ?  ?venlafaxine XR 75 MG 24 hr capsule ?Commonly known as: EFFEXOR-XR ?Take 75 mg by mouth at bedtime. ?  ? ?  ? ? ?Signature:  ?Chesley Mires, MD ?[place] ?Pager - [phone] - [phone] ?11/18/2021, 9:32 AM ?  ? ? ? ? ? ? ? ? ?

## 2021-11-18 NOTE — Addendum Note (Signed)
Addended by: Chesley Mires on: 11/18/2021 09:37 AM ? ? Modules accepted: Orders ? ?

## 2021-11-19 LAB — IGE: IgE (Immunoglobulin E), Serum: 74 kU/L (ref <=114)

## 2021-12-30 ENCOUNTER — Ambulatory Visit: Payer: 59 | Admitting: Nurse Practitioner

## 2022-04-13 NOTE — Therapy (Addendum)
OUTPATIENT PHYSICAL THERAPY THORACOLUMBAR EVALUATION   Patient Name: MELIDA NORTHINGTON MRN: [id number] DOB:08-21-1970, 51 y.o., female Today's Date: 04/14/2022   PT End of Session - 04/14/22 0931     Visit Number 1    Number of Visits 12    Date for PT Re-Evaluation 05/26/22    Authorization Type AETNA    PT Start Time 0806    PT Stop Time 0920    PT Time Calculation (min) 74 min    Activity Tolerance Patient tolerated treatment well    Behavior During Therapy WFL for tasks assessed/performed             Past Medical History:  Diagnosis Date   Allergy    Anemia menometrorrhaghia   history   Anginal pain ([place])    diagnosed in childhood, continues oocasionally duing adulthood   Anxiety    no meds   Arthritis    lower back and knees   Bilateral knee pain    no current prob - only bothers her when it rains   Complication of anesthesia    woke up coughing after surgery May 2014 - required inhaler in recovery and for a week after that surgery--pt thinks it was Symbicort   Depression    Fibroids    Gallstones    causing nausea and and midchest -abdominal pain   GERD (gastroesophageal reflux disease)    History - resolved treatment with diet - no meds   Herpes genitalis in women    History of chlamydia    Hypertension    IBS (irritable bowel syndrome)    diet controlled - no meds, no current prob   Insomnia    Migraines    topamax   MRSA (methicillin resistant staph aureus) culture positive    skin infection7/2013   PT STATES NO INFECTION AT PRESENT AND SHE IS FOLLOWING HER DOCTOR'S ORDERS TO USE MUPIROCIN  OINTMENT BID IN NOSE FIRST 5 DAYS OF EACH MONTH THRU DEC 2014 AND HAS HIBICLENS TO [place] THE AREAS WHERE THE INFECTION HAS BEEN.   PMS (premenstrual syndrome)    Pneumonia    Sarcoidosis    recent dx 11/2017- was hospitalized-tx w/ prednisone   Shortness of breath    with exertion     Thyromegaly    Vitamin D deficiency    Past Surgical History:  Procedure  Laterality Date   BLADDER SURGERY     as a child urethea stretched   CHOLECYSTECTOMY N/A 05/19/2013   Procedure: LAPAROSCOPIC CHOLECYSTECTOMY WITH INTRAOPERATIVE CHOLANGIOGRAM;  Surgeon: Madilyn Hook, DO;  Location: [facility];  Service: General;  Laterality: N/A;   DIAGNOSTIC LAPAROSCOPY     lap chole   DILATATION & CURETTAGE/HYSTEROSCOPY WITH MYOSURE N/A 12/28/2020   Procedure: DILATATION & CURETTAGE/HYSTEROSCOPY WITH MYOSURE;  Surgeon: Everett Graff, MD;  Location: [place];  Service: Gynecology;  Laterality: N/A;   DILATATION & CURRETTAGE/HYSTEROSCOPY WITH RESECTOCOPE N/A 11/05/2012   Procedure: DILATATION & CURETTAGE/HYSTEROSCOPY WITH RESECTOCOPE;  Surgeon: [name], MD;  Location: [facility];  Service: Gynecology;  Laterality: N/A;   DILATION AND CURETTAGE OF UTERUS  11/2013   DILITATION & CURRETTAGE/HYSTROSCOPY WITH NOVASURE ABLATION N/A 12/28/2020   Procedure: DILATATION & CURETTAGE/HYSTEROSCOPY WITH NOVASURE ABLATION;  Surgeon: Everett Graff, MD;  Location: [place];  Service: Gynecology;  Laterality: N/A;   IUD REMOVAL     skyla removed 02/10/14 in doctor's office   LEEP N/A 02/26/2018   Procedure: LOOP ELECTROSURGICAL EXCISION PROCEDURE (LEEP);  Surgeon: Everett Graff, MD;  Location: [facility];  Service: Gynecology;  Laterality: N/A;   MYOMECTOMY N/A 02/17/2014   Procedure: Exploratory Laparotomy MYOMECTOMY;  Surgeon: Marvene Staff, MD;  Location: [facility];  Service: Gynecology;  Laterality: N/A;   skyla iud  01-14-13   VIDEO BRONCHOSCOPY Bilateral 12/04/2017   Procedure: VIDEO BRONCHOSCOPY WITH FLUORO;  Surgeon: Chesley Mires, MD;  Location: [facility];  Service: Endoscopy;  Laterality: Bilateral;   WISDOM TOOTH EXTRACTION     Patient Active Problem List   Diagnosis Date Noted   Sarcoidosis 10/22/2018   Restrictive airway disease 01/19/2018   Cough 12/08/2017   Shortness of breath 12/08/2017   Lung mass 12/03/2017   Musculoskeletal chest pain 12/12/2015   Upper airway cough  syndrome 12/11/2015   S/P myomectomy 02/17/2014   Menorrhagia 09/20/2012   Fibroids 09/20/2012   Migraines 09/07/2012   MRSA infection 06/28/2012   HSV (herpes simplex virus) anogenital infection 06/28/2012     REFERRING PROVIDER: Melina Schools, MD   REFERRING DIAG: M54.51 (ICD-10-CM) - Vertebrogenic low back pain   Rationale for Evaluation and Treatment Rehabilitation  THERAPY DIAG:  Other low back pain  Muscle weakness (generalized)  ONSET DATE: PT order 03/18/2022  SUBJECTIVE:                                                                                                                                                                                           SUBJECTIVE STATEMENT: Pain began in 2019 with no specific MOI.  Pt noticed her back pain a few months prior to her dx of sarcoidosis.  Pt reports no specific exacerbations, her pain has been steady.  Pt has had lumbar ESI's and trigger point injections in the past which did not help.  Pt has received chiropractic care which eased her neck pain though not her back.  Pt tried acupuncture which didn't help.  Pt received PT in 2021 for her back and R knee which did help.    Pt saw Dr. Rolena Infante who ordered PT and ordered indicated aquatic therapy.  Pt also referred to pain management and spoke to pt about a weight loss/management program.  Pt saw pain management and received bilat lumbar medial branch blocks.  She states they were very painful and did not help.  Dr. Darreld Mclean note indicated he believed patient's pain is facetogenic in nature.    Pt c/o's of pain and tightness with walking 5 mins.  Pt states she has pain and some popping with twisting.  Pt has pain with bending.  She is unable to carry objects heavy.  Pt has increased pain with sitting at work.  She states she  puts her weight on L LE a lot due to her R knee pain.  Pt has pain with standing and is limited with trying to cook and cut vegetables.  She has to lean on  counter.  Pt has knee and lumbar pain with performing stairs.   Pt denies any radicular sx's in LE's though has had some pain from R knee down shin some.  She states she has L hip pain which improved with injection and prednisone  PERTINENT HISTORY:  Chronic back pain, lumbar DDD Hx of R knee pain and arthritis, L hip pain Sarcoidosis, anxiety, obesity Pt has a LATEX ALLERGY    PAIN:  Are you having pain? Yes: NPRS scale: Current and Best: 6/10 ; Worst 9/10 Pain location: lower central and bilat lumbar flanks Pain description: tight and achy Aggravating factors: cold and rainy weather, walking 5 mins, standing, sitting  Relieving factors: heat can ease pain a little, heated pain patch   PRECAUTIONS: Other: sarcoidosis, chronic back pain  WEIGHT BEARING RESTRICTIONS No  FALLS:  Has patient fallen in last 6 months? 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   PATIENT GOALS lose weight, improve mobility, to be able to stand and prepare food, improve function, reduce pain   OBJECTIVE:   DIAGNOSTIC FINDINGS:  03/18/22 X ray: (per Dr. Darreld Mclean note)  X-ray of the lumbar spine demonstrates some mild arthrosis in the lower facet joints. Alignment of the vertebral bodies is appropriate and no narrowing of intervertebral disc spaces with exception of L5/S1 which is minorly diminished.   MRI on 06/02/20: IMPRESSION: 1. No abnormality seen to explain the clinical presentation. Mild facet degeneration at L4-5 without edema or encroachment upon the neural structures. 2. L5-S1: Mild desiccation and annular bulging but no apparent compressive narrowing of the canal or  foramina.  PATIENT SURVEYS:  FOTO 42 with a goal of 54 at visit #12    COGNITION:  Overall cognitive status: Within functional limits for tasks assessed      MUSCLE LENGTH: Hamstrings: moderate (+) tightness in bilat HS   PALPATION: No tenderness in SP's and bilat lumbar paraspinals.  No tenderness in piriformis region bilat.  LUMBAR ROM:   Active  AROM  eval  Flexion WNL  Extension WNL  Right lateral flexion WFL  Left lateral flexion WFL  Right rotation WNL  Left rotation WNL   (Blank rows = not tested)  LOWER EXTREMITY STRENGTH:     MMT  Right eval Left eval  Hip flexion 5/5 4+/5  Hip extension 4/5 4-/5  Hip abduction 4/5 4/5  Hip adduction    Hip internal rotation    Hip external rotation 4+/5 5/5  Knee flexion    Knee extension 4-/5 5/5  Ankle dorsiflexion    Ankle plantarflexion    Ankle inversion    Ankle eversion     (Blank rows = not tested)  LOWER EXTREMITY ROM:    ROM Right eval Left eval  Hip flexion    Hip extension [facility]  Hip abduction [facility]  Hip adduction    Hip internal rotation    Hip external rotation    Knee flexion    Knee extension [facility]  WFL  Ankle dorsiflexion    Ankle plantarflexion    Ankle inversion    Ankle eversion     (Blank rows = not tested)  LUMBAR SPECIAL TESTS:   Supine SLR: negative bilat   GAIT: Assistive device utilized: None Level of assistance: Complete Independence Comments: Pt ambulated with a good heel to toe gait without limping.  She had = step length.     TODAY'S TREATMENT  Pt performed supine TrA contractions with 5 sec hold and was educated in palpation and performance of TrA contraction.  Pt performed supine clams with TrA with GTB x 10 reps, supine lumbar rotation x 10 reps bilat, and seated HS stretch 2x20-30 sec bilat.  Pt received a HEP handout and was educated in correct form and appropriate frequency.  Pt instructed she should not have increased pain with HEP.   See below for pt  education   PATIENT EDUCATION:  Education details: Objective findings, HEP, POC, dx, exercise rationale, and prognosis.  PT educated pt with aquatic process, properties, benefits, and purpose.    Person educated: Patient Education method: Explanation, Demonstration, Tactile cues, Verbal cues, and Handouts Education comprehension: verbalized understanding, returned demonstration, verbal cues required, tactile cues required, and needs further education   HOME EXERCISE PROGRAM: Access Code: [email or web address] URL: [email or web address] Date: 04/14/2022 Prepared by: Ronny Flurry  Exercises - Supine Transversus Abdominis Bracing - Hands on Stomach  - 2 x daily - 7 x weekly - 2 sets - 10 reps - 5 seconds hold - Hooklying Clamshell with Resistance  - 1 x daily - 4-5 x weekly - 2 sets - 10 reps - Hooklying Lumbar Rotation  - 2 x daily - 7 x weekly - 2 sets - 10 reps - Seated Hamstring Stretch  - 2 x daily - 7 x weekly - 2-3 reps - 20-30 seconds hold  ASSESSMENT:  CLINICAL IMPRESSION: Patient is a 51 y.o. female with a dx of vertebrogenic low back pain presenting to the clinic with LBP and LE muscle weakness.  She also has a hx of R knee and L hip pain.  MD ordered aquatic therapy.  Pt has received PT in the past which helped.  Pt has increased pain and limitations with functional mobility skills including ambulation and stairs.  She has pain with bending and twisting and also lifting objects.  Pt has increased pain with sitting at work.  Pt has pain with standing and is limited with trying to cook and cut vegetables.  Pt should benefit from skilled PT services to address impairments and to improver overall function.    OBJECTIVE IMPAIRMENTS decreased activity tolerance, decreased endurance, decreased mobility, difficulty walking, decreased strength, obesity, and pain.   ACTIVITY LIMITATIONS lifting, bending, sitting, standing, stairs, and locomotion level  PARTICIPATION LIMITATIONS: meal  prep, cleaning, laundry, shopping, and occupation  PERSONAL FACTORS Time since onset of injury/illness/exacerbation and 3+ comorbidities: Sarcoidosis, obesity, R knee pain, and L hip pain  are also affecting patient's functional outcome.   REHAB POTENTIAL: Good  CLINICAL DECISION MAKING: Evolving/moderate complexity  EVALUATION COMPLEXITY: Moderate   GOALS:  SHORT TERM GOALS: Target date:05/04/2022  Pt will be independent and compliant with HEP for improved pain, strength, and function.   Baseline: Goal status: INITIAL  2.  Pt will report at least a 25% improvement in performance of and reduced pain with standing activities and ambulation.  Baseline:  Goal status: INITIAL  3.  Pt will be able to walk > 10 mins without increased sx's.  Baseline:  Goal status: INITIAL  4.  Pt will report at least a 40% improvement with pain at work.  Baseline:  Goal status: INITIAL Target date:  05/12/2022  5.  Pt will tolerate aquatic therapy without adverse effects for improved pain, strength, and tolerance to activity.  Baseline:  Goal status: INITIAL    LONG TERM GOALS: Target date: 05/26/2022  Pt will be able to stand to cook and prepare food without significant pain or difficulty.  Baseline:  Goal status: INITIAL  2.  Pt will ambulate extended community distance without significant increased back pain or tightness.  Baseline:  Goal status: INITIAL  3.  Pt will demonstrate improved bilat LE strength to 5/5 MMT for improved tolerance with and performance of functional mobility skills and household chores.  Baseline:  Goal status: INITIAL  4.  Pt will report at least a 70% improvement in pain and sx's overall. Baseline:  Goal status: INITIAL   PLAN: PT FREQUENCY: 2x/week  PT DURATION: 6 weeks  PLANNED INTERVENTIONS: Therapeutic exercises, Therapeutic activity, Neuromuscular re-education, Gait training, Patient/Family education, Self Care, Joint mobilization, Stair  training, Aquatic Therapy, Dry Needling, Electrical stimulation, Spinal mobilization, Cryotherapy, Moist heat, Taping, Ultrasound, Manual therapy, and Re-evaluation.  PLAN FOR NEXT SESSION: Cont with aquatic therapy for 4 weeks and then possible reassessment on land and progression of HEP.  Pt has a LATEX ALLERGY   Selinda Michaels III PT, DPT 04/14/22 5:17 PM

## 2022-04-14 ENCOUNTER — Other Ambulatory Visit: Payer: Self-pay

## 2022-04-14 ENCOUNTER — Ambulatory Visit (HOSPITAL_BASED_OUTPATIENT_CLINIC_OR_DEPARTMENT_OTHER): Payer: 59 | Attending: Orthopedic Surgery | Admitting: Physical Therapy

## 2022-04-14 ENCOUNTER — Encounter (HOSPITAL_BASED_OUTPATIENT_CLINIC_OR_DEPARTMENT_OTHER): Payer: Self-pay | Admitting: Physical Therapy

## 2022-04-14 DIAGNOSIS — M6281 Muscle weakness (generalized): Secondary | ICD-10-CM | POA: Diagnosis present

## 2022-04-14 DIAGNOSIS — M5459 Other low back pain: Secondary | ICD-10-CM

## 2022-04-21 ENCOUNTER — Ambulatory Visit (HOSPITAL_BASED_OUTPATIENT_CLINIC_OR_DEPARTMENT_OTHER): Payer: 59 | Attending: Orthopedic Surgery | Admitting: Physical Therapy

## 2022-04-21 ENCOUNTER — Encounter (HOSPITAL_BASED_OUTPATIENT_CLINIC_OR_DEPARTMENT_OTHER): Payer: Self-pay | Admitting: Physical Therapy

## 2022-04-21 DIAGNOSIS — M5459 Other low back pain: Secondary | ICD-10-CM | POA: Diagnosis present

## 2022-04-21 DIAGNOSIS — M6281 Muscle weakness (generalized): Secondary | ICD-10-CM

## 2022-04-21 NOTE — Therapy (Addendum)
OUTPATIENT PHYSICAL THERAPY THORACOLUMBAR TREATMENT   Patient Name: Yvonne Banks MRN: [id number] DOB:August 03, 1970, 51 y.o., female Today's Date: 04/21/2022   PT End of Session - 04/21/22 0743     Visit Number 2    Number of Visits 12    Date for PT Re-Evaluation 05/26/22    Authorization Type AET[employer]    PT Start Time [phone]   pt arrived late to pool area   PT Stop Time 0814    PT Time Calculation (min) 38 min    Activity Tolerance Patient tolerated treatment well    Behavior During Therapy [facility] for tasks assessed/performed             Past Medical History:  Diagnosis Date   Allergy    Anemia menometrorrhaghia   history   Anginal pain ([place])    diagnosed in childhood, continues oocasionally duing adulthood   Anxiety    no meds   Arthritis    lower back and knees   Bilateral knee pain    no current prob - only bothers her when it rains   Complication of anesthesia    woke up coughing after surgery May 2014 - required inhaler in recovery and for a week after that surgery--pt thinks it was Symbicort   Depression    Fibroids    Gallstones    causing nausea and and midchest -abdominal pain   GERD (gastroesophageal reflux disease)    History - resolved treatment with diet - no meds   Herpes genitalis in women    History of chlamydia    Hypertension    IBS (irritable bowel syndrome)    diet controlled - no meds, no current prob   Insomnia    Migraines    topamax   MRSA (methicillin resistant staph aureus) culture positive    skin infection7/2013   PT STATES NO INFECTION AT PRESENT AND SHE IS FOLLOWING HER DOCTOR'S ORDERS TO USE MUPIROCIN  OINTMENT BID IN NOSE FIRST 5 DAYS OF EACH MONTH THRU DEC 2014 AND HAS HIBICLENS TO [place] THE AREAS WHERE THE INFECTION HAS BEEN.   PMS (premenstrual syndrome)    Pneumonia    Sarcoidosis    recent dx 11/2017- was hospitalized-tx w/ prednisone   Shortness of breath    with exertion     Thyromegaly    Vitamin D deficiency    Past  Surgical History:  Procedure Laterality Date   BLADDER SURGERY     as a child urethea stretched   CHOLECYSTECTOMY N/A 05/19/2013   Procedure: LAPAROSCOPIC CHOLECYSTECTOMY WITH INTRAOPERATIVE CHOLANGIOGRAM;  Surgeon: Madilyn Hook, DO;  Location: [facility];  Service: General;  Laterality: N/A;   DIAGNOSTIC LAPAROSCOPY     lap chole   DILATATION & CURETTAGE/HYSTEROSCOPY WITH MYOSURE N/A 12/28/2020   Procedure: DILATATION & CURETTAGE/HYSTEROSCOPY WITH MYOSURE;  Surgeon: Everett Graff, MD;  Location: [place];  Service: Gynecology;  Laterality: N/A;   DILATATION & CURRETTAGE/HYSTEROSCOPY WITH RESECTOCOPE N/A 11/05/2012   Procedure: DILATATION & CURETTAGE/HYSTEROSCOPY WITH RESECTOCOPE;  Surgeon: [name], MD;  Location: [place] [facility];  Service: Gynecology;  Laterality: N/A;   DILATION AND CURETTAGE OF UTERUS  11/2013   DILITATION & CURRETTAGE/HYSTROSCOPY WITH NOVASURE ABLATION N/A 12/28/2020   Procedure: DILATATION & CURETTAGE/HYSTEROSCOPY WITH NOVASURE ABLATION;  Surgeon: Everett Graff, MD;  Location: [place];  Service: Gynecology;  Laterality: N/A;   IUD REMOVAL     skyla removed 02/10/14 in doctor's office   LEEP N/A 02/26/2018   Procedure: LOOP ELECTROSURGICAL EXCISION PROCEDURE (  LEEP);  Surgeon: Everett Graff, MD;  Location: [facility];  Service: Gynecology;  Laterality: N/A;   MYOMECTOMY N/A 02/17/2014   Procedure: Exploratory Laparotomy MYOMECTOMY;  Surgeon: Marvene Staff, MD;  Location: [facility];  Service: Gynecology;  Laterality: N/A;   skyla iud  01-14-13   VIDEO BRONCHOSCOPY Bilateral 12/04/2017   Procedure: VIDEO BRONCHOSCOPY WITH FLUORO;  Surgeon: Chesley Mires, MD;  Location: [facility];  Service: Endoscopy;  Laterality: Bilateral;   WISDOM TOOTH EXTRACTION     Patient Active Problem List   Diagnosis Date Noted   Sarcoidosis 10/22/2018   Restrictive airway disease 01/19/2018   Cough 12/08/2017   Shortness of breath 12/08/2017   Lung mass 12/03/2017   Musculoskeletal chest pain  12/12/2015   Upper airway cough syndrome 12/11/2015   S/P myomectomy 02/17/2014   Menorrhagia 09/20/2012   Fibroids 09/20/2012   Migraines 09/07/2012   MRSA infection 06/28/2012   HSV (herpes simplex virus) anogenital infection 06/28/2012     REFERRING PROVIDER: Melina Schools, MD   REFERRING DIAG: M54.51 (ICD-10-CM) - Vertebrogenic low back pain   Rationale for Evaluation and Treatment Rehabilitation  THERAPY DIAG:  Other low back pain  Muscle weakness (generalized)  ONSET DATE: PT order 03/18/2022  SUBJECTIVE:                                                                                                                                                                                           SUBJECTIVE STATEMENT:  Pt reports she has been completing her HEP without any pain.  No other changes since last visit.   PERTINENT HISTORY:  Chronic back pain, lumbar DDD Hx of R knee pain and arthritis, L hip pain Sarcoidosis, anxiety, obesity Pt has a LATEX ALLERGY    PAIN:  Are you having pain? Yes:  NPRS scale: 7-8/10 Pain location: lower central and bilat lumbar flanks Pain description: tight and achy Aggravating factors: cold and rainy weather, walking 5 mins, standing, sitting  Relieving factors: heat can ease pain a little, heated pain patch   PRECAUTIONS: Other: sarcoidosis, chronic back pain  WEIGHT BEARING RESTRICTIONS No  FALLS:  Has patient fallen in last 6 months? 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   PATIENT GOALS lose weight, improve mobility, to be able to stand and prepare food, improve function, reduce pain  OBJECTIVE:  (findings taken at eval unless otherwise noted)  DIAGNOSTIC FINDINGS:  03/18/22 X ray: (per Dr. Darreld Mclean note)  X-ray of the lumbar spine demonstrates some mild arthrosis in the lower facet joints. Alignment of the vertebral bodies is appropriate and no narrowing of intervertebral disc spaces with exception of L5/S1 which is minorly diminished.   MRI on 06/02/20: IMPRESSION: 1. No abnormality seen to explain the clinical presentation. Mild facet degeneration at L4-5 without edema or encroachment upon the neural structures. 2. L5-S1: Mild desiccation and annular bulging but no apparent compressive narrowing of the canal or foramina.  PATIENT SURVEYS:  FOTO 42 with a goal of 54 at visit #12    COGNITION:  Overall cognitive status: Within functional limits for tasks assessed      MUSCLE LENGTH: Hamstrings: moderate (+) tightness in bilat HS   PALPATION: No tenderness in SP's and bilat lumbar paraspinals.  No tenderness in piriformis region bilat.  LUMBAR ROM:   Active  AROM  eval  Flexion WNL  Extension WNL  Right lateral flexion WFL  Left lateral flexion WFL  Right rotation WNL  Left rotation WNL   (Blank rows = not tested)  LOWER EXTREMITY STRENGTH:     MMT  Right eval Left eval  Hip flexion 5/5 4+/5  Hip extension 4/5 4-/5  Hip abduction 4/5 4/5  Hip adduction    Hip internal rotation    Hip external rotation 4+/5 5/5  Knee flexion    Knee extension 4-/5 5/5  Ankle dorsiflexion    Ankle plantarflexion    Ankle inversion    Ankle eversion     (Blank rows = not tested)  LOWER EXTREMITY ROM:    ROM Right eval Left eval  Hip flexion    Hip extension [facility]  Hip abduction [facility]  Hip adduction    Hip internal rotation    Hip external rotation    Knee flexion    Knee extension [facility]  Ankle dorsiflexion    Ankle plantarflexion    Ankle inversion    Ankle eversion     (Blank rows = not tested)  LUMBAR SPECIAL TESTS:   Supine SLR: negative  bilat   GAIT: Assistive device utilized: None Level of assistance: Complete Independence Comments: Pt ambulated with a good heel to toe gait without limping.  She had = step length.     TODAY'S TREATMENT  Pt seen for aquatic therapy today.  Treatment took place in water 3.25-4.5 ft in depth at the [facility]. Temp of water was 91.  Pt entered/exited the pool via stairs with step-to pattern independently with bilat rail.  * intro to [employer] * without support- forward and backward gait, side stepping * holding white barbell: forward/ backward marching * holding wall:  hip abdct/add x 5 x 2;  heel / toe raises x 15; hip openers and crosses x 5 x 2 each * L stretch at stairs with cues for form * holding rails:  quad stretch x 2, hamstring stretch x 1 * seated on steps: glute stretch in fig 4 position  Pt requires the buoyancy and hydrostatic pressure of water for support, and to offload joints by unweighting joint load by at least 50 % in navel deep water and by at least 75-80% in chest to neck deep water.  Viscosity of the water is needed for resistance of strengthening. Water current perturbations provides challenge to standing balance requiring increased core activation.  PATIENT EDUCATION:  Education details: Teaching laboratory technician.    Person educated: Patient Education method: Explanation, Demonstration, Tactile cues, Verbal cues, and Handouts Education comprehension: verbalized understanding, returned demonstration, verbal cues required, tactile cues required, and needs further education   HOME EXERCISE PROGRAM: Access Code: [email or web address] URL: [email or web address] Date: 04/14/2022 Prepared by: Ronny Flurry  Exercises - Supine Transversus Abdominis Bracing - Hands on Stomach  - 2 x daily - 7 x weekly - 2 sets - 10 reps - 5 seconds hold - Hooklying Clamshell with Resistance  - 1 x daily - 4-5 x weekly - 2 sets - 10 reps - Hooklying Lumbar Rotation  - 2  x daily - 7 x weekly - 2 sets - 10 reps - Seated Hamstring Stretch  - 2 x daily - 7 x weekly - 2-3 reps - 20-30 seconds hold  ASSESSMENT:  CLINICAL IMPRESSION: Pt is confident in aquatic environment and can take direction from therapist on deck.  She reported 1-2 point reduction of pain while in the water.  Encouraged pt to complete stretches throughout work day on her standing breaks or at her desk.   Pt should benefit from skilled PT services to address impairments and to improver overall function. Goals are ongoing.    OBJECTIVE IMPAIRMENTS decreased activity tolerance, decreased endurance, decreased mobility, difficulty walking, decreased strength, obesity, and pain.   ACTIVITY LIMITATIONS lifting, bending, sitting, standing, stairs, and locomotion level  PARTICIPATION LIMITATIONS: meal prep, cleaning, laundry, shopping, and occupation  PERSONAL FACTORS Time since onset of injury/illness/exacerbation and 3+ comorbidities: Sarcoidosis, obesity, R knee pain, and L hip pain  are also affecting patient's functional outcome.   REHAB POTENTIAL: Good  CLINICAL DECISION MAKING: Evolving/moderate complexity  EVALUATION COMPLEXITY: Moderate   GOALS:  SHORT TERM GOALS: Target date:05/04/2022  Pt will be independent and compliant with HEP for improved pain, strength, and function.   Baseline: Goal status: INITIAL  2.  Pt will report at least a 25% improvement in performance of and reduced pain with standing activities and ambulation.  Baseline:  Goal status: INITIAL  3.  Pt will be able to walk > 10 mins without increased sx's.   Baseline:  Goal status: INITIAL  4.  Pt will report at least a 40% improvement with pain at work.  Baseline:  Goal status: INITIAL Target date:  05/12/2022  5.  Pt will tolerate aquatic therapy without adverse effects for improved pain, strength, and tolerance to activity.  Baseline:  Goal status: INITIAL    LONG TERM GOALS: Target date:  05/26/2022  Pt will be able to stand to cook and prepare food without significant pain or difficulty.  Baseline:  Goal status: INITIAL  2.  Pt will ambulate extended community distance without significant increased back pain or tightness.  Baseline:  Goal status: INITIAL  3.  Pt will demonstrate improved bilat LE strength to 5/5 MMT for improved tolerance with and performance of functional mobility skills and household chores.  Baseline:  Goal status: INITIAL  4.  Pt will report at least a 70% improvement in pain and sx's overall. Baseline:  Goal status: INITIAL   PLAN: PT FREQUENCY: 2x/week  PT DURATION: 6 weeks  PLANNED INTERVENTIONS: Therapeutic exercises, Therapeutic activity, Neuromuscular re-education, Gait training, Patient/Family education, Self Care, Joint mobilization, Stair training, Aquatic Therapy, Dry Needling, Electrical stimulation, Spinal mobilization, Cryotherapy, Moist heat, Taping, Ultrasound, Manual therapy, and Re-evaluation.  PLAN FOR NEXT SESSION: Cont with aquatic therapy for 4 weeks and then possible reassessment on land and  progression of HEP.  Pt has a LATEX ALLERGY  Mayer Camel, PTA 04/21/22 10:18 AM [facility] [address] [place], [place], [address] Phone: [phone]   Fax:  [phone]  PHYSICAL THERAPY DISCHARGE SUMMARY  Visits from Start of Care: 2  Current functional level related to goals / functional outcomes: Unable to assess current functional status due to pt not being present at discharge.    Remaining deficits: Unable to assess due to pt not being present at discharge.    Education / Equipment: Pt has a HEP.    Pt was seen in PT for 2 visits on 04/14/22 and 04/21/22.  She had to cancel her appt's due to being busy making arrangements and being out of town due to her father passing away.  She then had to cancel her remaining PT appt's due to being sick and having  exhausted all of her vacation/sick days at work.  Pt will be considered discharged at this time.   Audie Clear III PT, DPT 11/03/22 11:40 AM

## 2022-04-30 ENCOUNTER — Ambulatory Visit (HOSPITAL_BASED_OUTPATIENT_CLINIC_OR_DEPARTMENT_OTHER): Payer: 59 | Admitting: Physical Therapy

## 2022-05-02 ENCOUNTER — Ambulatory Visit (HOSPITAL_BASED_OUTPATIENT_CLINIC_OR_DEPARTMENT_OTHER): Payer: 59 | Admitting: Physical Therapy

## 2022-05-05 ENCOUNTER — Ambulatory Visit (HOSPITAL_BASED_OUTPATIENT_CLINIC_OR_DEPARTMENT_OTHER): Payer: 59 | Admitting: Physical Therapy

## 2022-05-09 ENCOUNTER — Ambulatory Visit (HOSPITAL_BASED_OUTPATIENT_CLINIC_OR_DEPARTMENT_OTHER): Payer: 59 | Admitting: Physical Therapy

## 2022-05-12 ENCOUNTER — Ambulatory Visit (HOSPITAL_BASED_OUTPATIENT_CLINIC_OR_DEPARTMENT_OTHER): Payer: 59 | Admitting: Physical Therapy

## 2022-05-15 NOTE — Therapy (Incomplete)
OUTPATIENT PHYSICAL THERAPY THORACOLUMBAR TREATMENT   Patient Name: Yvonne Banks MRN: [id number] DOB:06/29/1971, 51 y.o., female Today's Date: 05/15/2022     Past Medical History:  Diagnosis Date   Allergy    Anemia menometrorrhaghia   history   Anginal pain ([place])    diagnosed in childhood, continues oocasionally duing adulthood   Anxiety    no meds   Arthritis    lower back and knees   Bilateral knee pain    no current prob - only bothers her when it rains   Complication of anesthesia    woke up coughing after surgery May 2014 - required inhaler in recovery and for a week after that surgery--pt thinks it was Symbicort   Depression    Fibroids    Gallstones    causing nausea and and midchest -abdominal pain   GERD (gastroesophageal reflux disease)    History - resolved treatment with diet - no meds   Herpes genitalis in women    History of chlamydia    Hypertension    IBS (irritable bowel syndrome)    diet controlled - no meds, no current prob   Insomnia    Migraines    topamax   MRSA (methicillin resistant staph aureus) culture positive    skin infection7/2013   PT STATES NO INFECTION AT PRESENT AND SHE IS FOLLOWING HER DOCTOR'S ORDERS TO USE MUPIROCIN  OINTMENT BID IN NOSE FIRST 5 DAYS OF EACH MONTH THRU DEC 2014 AND HAS HIBICLENS TO [place] THE AREAS WHERE THE INFECTION HAS BEEN.   PMS (premenstrual syndrome)    Pneumonia    Sarcoidosis    recent dx 11/2017- was hospitalized-tx w/ prednisone   Shortness of breath    with exertion     Thyromegaly    Vitamin D deficiency    Past Surgical History:  Procedure Laterality Date   BLADDER SURGERY     as a child urethea stretched   CHOLECYSTECTOMY N/A 05/19/2013   Procedure: LAPAROSCOPIC CHOLECYSTECTOMY WITH INTRAOPERATIVE CHOLANGIOGRAM;  Surgeon: Madilyn Hook, DO;  Location: [facility];  Service: General;  Laterality: N/A;   DIAGNOSTIC LAPAROSCOPY     lap chole   DILATATION & CURETTAGE/HYSTEROSCOPY WITH MYOSURE N/A  12/28/2020   Procedure: DILATATION & CURETTAGE/HYSTEROSCOPY WITH MYOSURE;  Surgeon: Everett Graff, MD;  Location: [place];  Service: Gynecology;  Laterality: N/A;   DILATATION & CURRETTAGE/HYSTEROSCOPY WITH RESECTOCOPE N/A 11/05/2012   Procedure: DILATATION & CURETTAGE/HYSTEROSCOPY WITH RESECTOCOPE;  Surgeon: [name], MD;  Location: [facility];  Service: Gynecology;  Laterality: N/A;   DILATION AND CURETTAGE OF UTERUS  11/2013   DILITATION & CURRETTAGE/HYSTROSCOPY WITH NOVASURE ABLATION N/A 12/28/2020   Procedure: DILATATION & CURETTAGE/HYSTEROSCOPY WITH NOVASURE ABLATION;  Surgeon: Everett Graff, MD;  Location: [facility];  Service: Gynecology;  Laterality: N/A;   IUD REMOVAL     skyla removed 02/10/14 in doctor's office   LEEP N/A 02/26/2018   Procedure: LOOP ELECTROSURGICAL EXCISION PROCEDURE (LEEP);  Surgeon: Everett Graff, MD;  Location: [facility];  Service: Gynecology;  Laterality: N/A;   MYOMECTOMY N/A 02/17/2014   Procedure: Exploratory Laparotomy MYOMECTOMY;  Surgeon: Marvene Staff, MD;  Location: [facility];  Service: Gynecology;  Laterality: N/A;   skyla iud  01-14-13   VIDEO BRONCHOSCOPY Bilateral 12/04/2017   Procedure: VIDEO BRONCHOSCOPY WITH FLUORO;  Surgeon: Chesley Mires, MD;  Location: [facility];  Service: Endoscopy;  Laterality: Bilateral;   WISDOM TOOTH EXTRACTION     Patient Active Problem List   Diagnosis Date  Noted   Sarcoidosis 10/22/2018   Restrictive airway disease 01/19/2018   Cough 12/08/2017   Shortness of breath 12/08/2017   Lung mass 12/03/2017   Musculoskeletal chest pain 12/12/2015   Upper airway cough syndrome 12/11/2015   S/P myomectomy 02/17/2014   Menorrhagia 09/20/2012   Fibroids 09/20/2012   Migraines 09/07/2012   MRSA infection 06/28/2012   HSV (herpes simplex virus) anogenital infection 06/28/2012     REFERRING PROVIDER: Melina Schools, MD   REFERRING DIAG: M54.51 (ICD-10-CM) - Vertebrogenic low back pain   Rationale for Evaluation and  Treatment Rehabilitation  THERAPY DIAG:  No diagnosis found.  ONSET DATE: PT order 03/18/2022  SUBJECTIVE:                                                                                                                                                                                           SUBJECTIVE STATEMENT:  Pt reports she has been completing her HEP without any pain.  No other changes since last visit.   PERTINENT HISTORY:  Chronic back pain, lumbar DDD Hx of R knee pain and arthritis, L hip pain Sarcoidosis, anxiety, obesity Pt has a LATEX ALLERGY    PAIN:  Are you having pain? Yes:  NPRS scale: 7-8/10 Pain location: lower central and bilat lumbar flanks Pain description: tight and achy Aggravating factors: cold and rainy weather, walking 5 mins, standing, sitting  Relieving factors: heat can ease pain a little, heated pain patch   PRECAUTIONS: Other: sarcoidosis, chronic back pain  WEIGHT BEARING RESTRICTIONS No  FALLS:  Has patient fallen in last 6 months? 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   PATIENT GOALS lose weight, improve mobility, to be able to stand and prepare food, improve function, reduce pain   OBJECTIVE: (findings taken at eval unless otherwise noted)  DIAGNOSTIC FINDINGS:  03/18/22 X ray: (per Dr. Darreld Mclean note)  X-ray of the lumbar spine demonstrates some mild arthrosis in the lower facet joints. Alignment of the vertebral bodies is appropriate and no narrowing of intervertebral disc spaces with exception of L5/S1 which is minorly diminished.   MRI on 06/02/20: IMPRESSION: 1. No abnormality seen to explain the  clinical presentation. Mild facet degeneration at L4-5 without edema or encroachment upon the neural structures. 2. L5-S1: Mild desiccation and annular bulging but no apparent compressive narrowing of  the canal or foramina.  PATIENT SURVEYS:  FOTO 42 with a goal of 54 at visit #12    COGNITION:  Overall cognitive status: Within functional limits for tasks assessed      MUSCLE LENGTH: Hamstrings: moderate (+) tightness in bilat HS   PALPATION: No tenderness in SP's and bilat lumbar paraspinals.  No tenderness in piriformis region bilat.  LUMBAR ROM:   Active  AROM  eval  Flexion WNL  Extension WNL  Right lateral flexion WFL  Left lateral flexion WFL  Right rotation WNL  Left rotation WNL   (Blank rows = not tested)  LOWER EXTREMITY STRENGTH:     MMT  Right eval Left eval  Hip flexion 5/5 4+/5  Hip extension 4/5 4-/5  Hip abduction 4/5 4/5  Hip adduction    Hip internal rotation    Hip external rotation 4+/5 5/5  Knee flexion    Knee extension 4-/5 5/5  Ankle dorsiflexion    Ankle plantarflexion    Ankle inversion    Ankle eversion     (Blank rows = not tested)  LOWER EXTREMITY ROM:    ROM Right eval Left eval  Hip flexion    Hip extension [facility]  Hip abduction [facility]  Hip adduction    Hip internal rotation    Hip external rotation    Knee flexion    Knee extension [facility]  Ankle dorsiflexion    Ankle plantarflexion    Ankle inversion    Ankle eversion     (Blank rows = not tested)  LUMBAR SPECIAL TESTS:   Supine SLR: negative bilat   GAIT: Assistive device utilized: None Level of assistance: Complete Independence Comments: Pt ambulated with a good heel to toe gait without limping.  She had = step length.     TODAY'S TREATMENT  Pt seen for aquatic therapy today.  Treatment took place in water 3.25-4.5 ft in depth at the [place]. Temp of water was 91.  Pt entered/exited the pool via stairs with step-to pattern  independently with bilat rail.  * intro to [employer] * without support- forward and backward gait, side stepping * holding white barbell: forward/ backward marching * holding wall:  hip abdct/add x 5 x 2;  heel / toe raises x 15; hip openers and crosses x 5 x 2 each * L stretch at stairs with cues for form * holding rails:  quad stretch x 2, hamstring stretch x 1 * seated on steps: glute stretch in fig 4 position  Pt requires the buoyancy and hydrostatic pressure of water for support, and to offload joints by unweighting joint load by at least 50 % in navel deep water and by at least 75-80% in chest to neck deep water.  Viscosity of the water is needed for resistance of strengthening. Water current perturbations provides challenge to standing balance requiring increased core activation.     PATIENT EDUCATION:  Education details: Teaching laboratory technician.    Person educated: Patient Education method: Explanation, Demonstration, Tactile cues, Verbal cues, and Handouts Education comprehension: verbalized understanding, returned demonstration, verbal cues required, tactile cues required, and needs further education   HOME EXERCISE PROGRAM: Access Code: [email or web address] URL: [email or web address] Date: 04/14/2022 Prepared by: Ronny Flurry  Exercises - Supine Transversus Abdominis Bracing - Hands on Stomach  - 2 x daily - 7 x weekly - 2 sets - 10 reps - 5 seconds hold - Hooklying Clamshell with Resistance  - 1 x daily - 4-5 x  weekly - 2 sets - 10 reps - Hooklying Lumbar Rotation  - 2 x daily - 7 x weekly - 2 sets - 10 reps - Seated Hamstring Stretch  - 2 x daily - 7 x weekly - 2-3 reps - 20-30 seconds hold  ASSESSMENT:  CLINICAL IMPRESSION: Pt is confident in aquatic environment and can take direction from therapist on deck.  She reported 1-2 point reduction of pain while in the water.  Encouraged pt to complete stretches throughout work day on her standing breaks or at her desk.   Pt  should benefit from skilled PT services to address impairments and to improver overall function. Goals are ongoing.    OBJECTIVE IMPAIRMENTS decreased activity tolerance, decreased endurance, decreased mobility, difficulty walking, decreased strength, obesity, and pain.   ACTIVITY LIMITATIONS lifting, bending, sitting, standing, stairs, and locomotion level  PARTICIPATION LIMITATIONS: meal prep, cleaning, laundry, shopping, and occupation  PERSONAL FACTORS Time since onset of injury/illness/exacerbation and 3+ comorbidities: Sarcoidosis, obesity, R knee pain, and L hip pain  are also affecting patient's functional outcome.   REHAB POTENTIAL: Good  CLINICAL DECISION MAKING: Evolving/moderate complexity  EVALUATION COMPLEXITY: Moderate   GOALS:  SHORT TERM GOALS: Target date:05/04/2022  Pt will be independent and compliant with HEP for improved pain, strength, and function.   Baseline: Goal status: INITIAL  2.  Pt will report at least a 25% improvement in performance of and reduced pain with standing activities and ambulation.  Baseline:  Goal status: INITIAL  3.  Pt will be able to walk > 10 mins without increased sx's.   Baseline:  Goal status: INITIAL  4.  Pt will report at least a 40% improvement with pain at work.  Baseline:  Goal status: INITIAL Target date:  05/12/2022  5.  Pt will tolerate aquatic therapy without adverse effects for improved pain, strength, and tolerance to activity.  Baseline:  Goal status: INITIAL    LONG TERM GOALS: Target date: 05/26/2022  Pt will be able to stand to cook and prepare food without significant pain or difficulty.  Baseline:  Goal status: INITIAL  2.  Pt will ambulate extended community distance without significant increased back pain or tightness.  Baseline:  Goal status: INITIAL  3.  Pt will demonstrate improved bilat LE strength to 5/5 MMT for improved tolerance with and performance of functional mobility skills and  household chores.  Baseline:  Goal status: INITIAL  4.  Pt will report at least a 70% improvement in pain and sx's overall. Baseline:  Goal status: INITIAL   PLAN: PT FREQUENCY: 2x/week  PT DURATION: 6 weeks  PLANNED INTERVENTIONS: Therapeutic exercises, Therapeutic activity, Neuromuscular re-education, Gait training, Patient/Family education, Self Care, Joint mobilization, Stair training, Aquatic Therapy, Dry Needling, Electrical stimulation, Spinal mobilization, Cryotherapy, Moist heat, Taping, Ultrasound, Manual therapy, and Re-evaluation.  PLAN FOR NEXT SESSION: Cont with aquatic therapy for 4 weeks and then possible reassessment on land and progression of HEP.  Pt has a LATEX ALLERGY  Annamarie Major) Reona Zendejas MPT 05/15/22 6:54 PM [facility] [address] [place], [place], [address] Phone: [phone]   Fax:  [phone]

## 2022-05-16 ENCOUNTER — Ambulatory Visit (HOSPITAL_BASED_OUTPATIENT_CLINIC_OR_DEPARTMENT_OTHER): Payer: 59 | Admitting: Physical Therapy

## 2022-05-20 ENCOUNTER — Ambulatory Visit (HOSPITAL_BASED_OUTPATIENT_CLINIC_OR_DEPARTMENT_OTHER): Payer: 59 | Admitting: Physical Therapy

## 2022-05-22 ENCOUNTER — Ambulatory Visit (HOSPITAL_BASED_OUTPATIENT_CLINIC_OR_DEPARTMENT_OTHER): Payer: 59 | Admitting: Physical Therapy

## 2022-05-27 ENCOUNTER — Ambulatory Visit (HOSPITAL_BASED_OUTPATIENT_CLINIC_OR_DEPARTMENT_OTHER): Payer: 59 | Admitting: Physical Therapy

## 2022-05-30 ENCOUNTER — Ambulatory Visit (HOSPITAL_BASED_OUTPATIENT_CLINIC_OR_DEPARTMENT_OTHER): Payer: 59 | Admitting: Physical Therapy

## 2022-06-02 ENCOUNTER — Ambulatory Visit (HOSPITAL_BASED_OUTPATIENT_CLINIC_OR_DEPARTMENT_OTHER): Payer: 59 | Admitting: Physical Therapy

## 2022-06-06 ENCOUNTER — Ambulatory Visit (HOSPITAL_BASED_OUTPATIENT_CLINIC_OR_DEPARTMENT_OTHER): Payer: 59 | Admitting: Physical Therapy

## 2022-06-06 IMAGING — DX DG CHEST 2V
2 series · 2 of 2 positions shown · non-contrast
Comparison: February 23, 2018

CLINICAL DATA: Asthma.

EXAM:
CHEST - 2 VIEW

[chest pa]
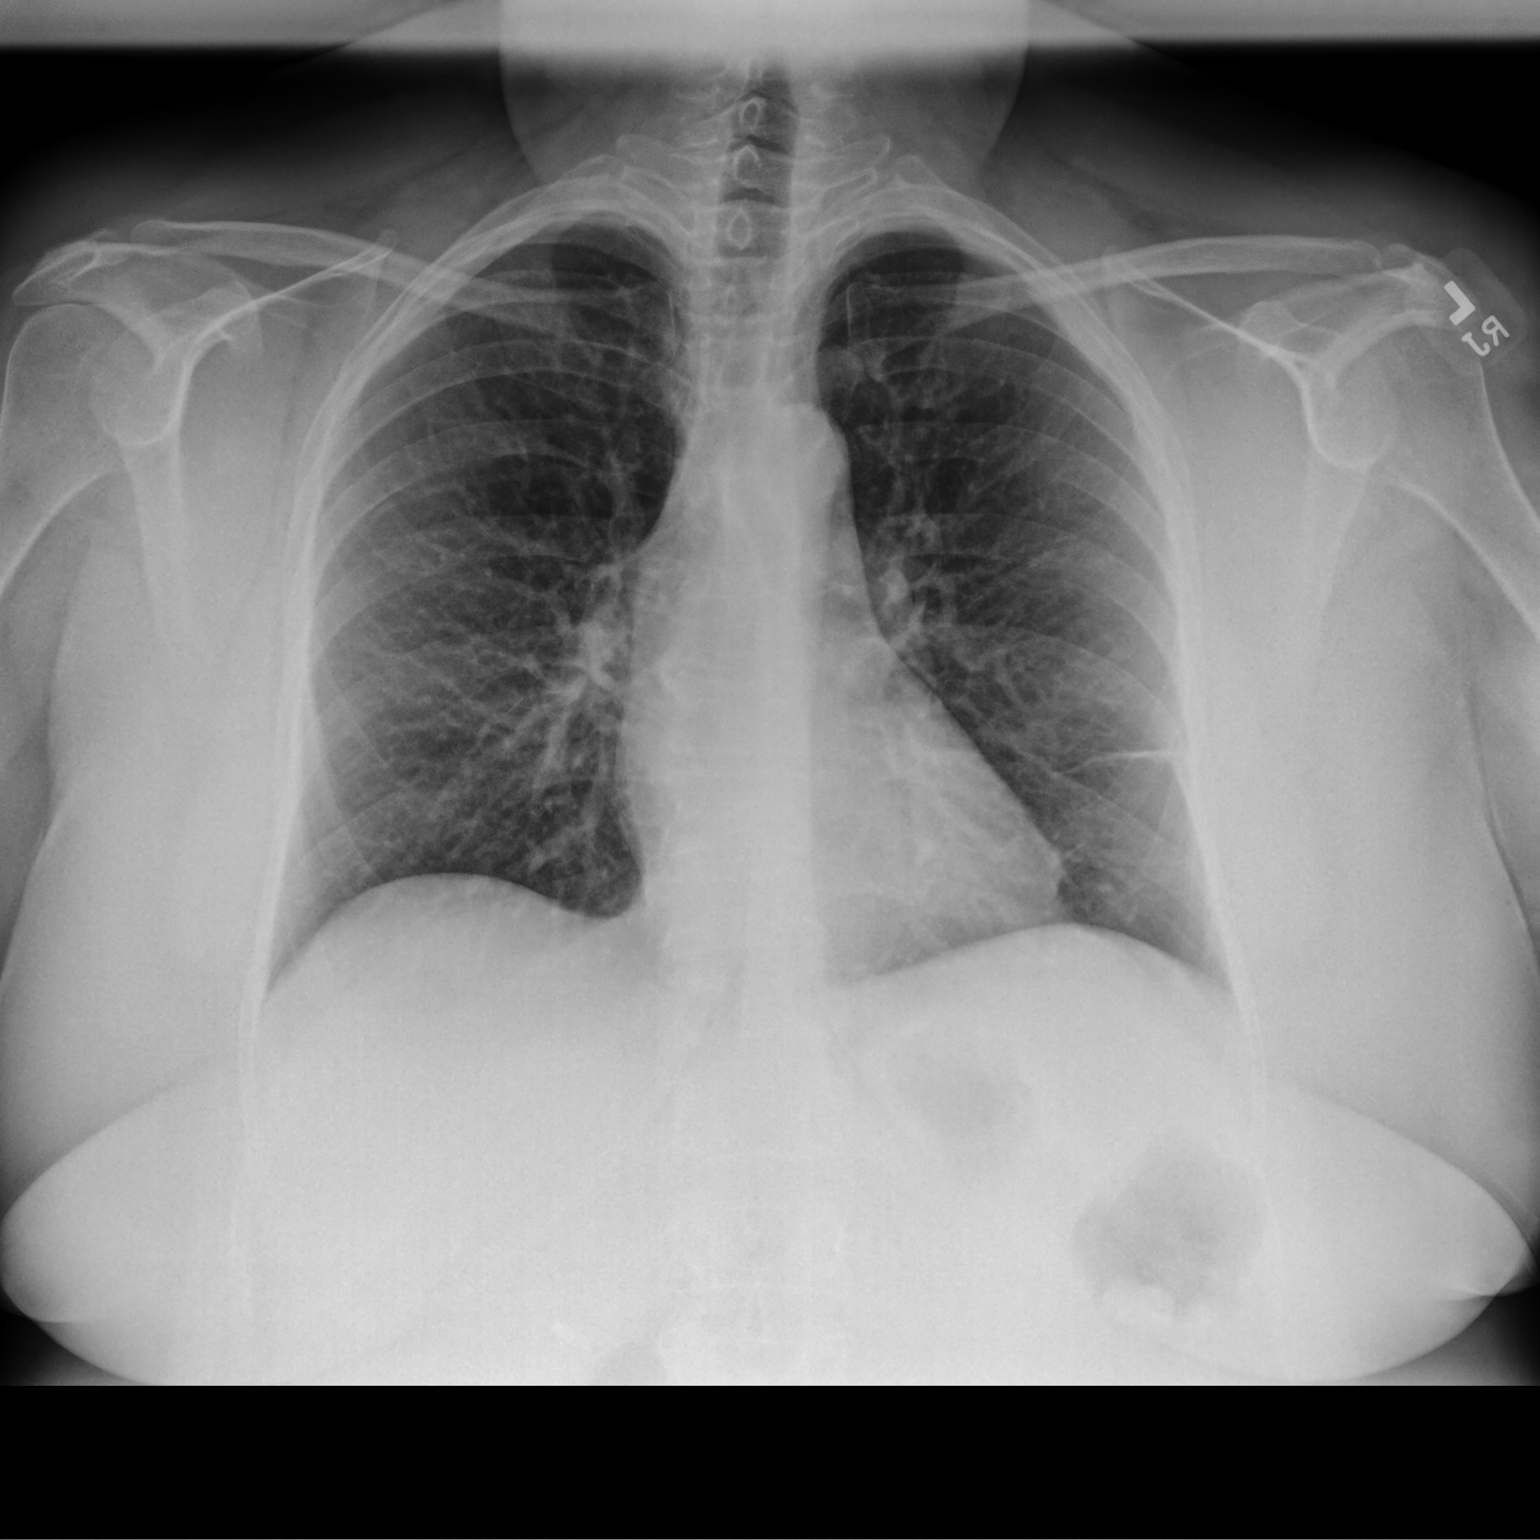

[chest lat]
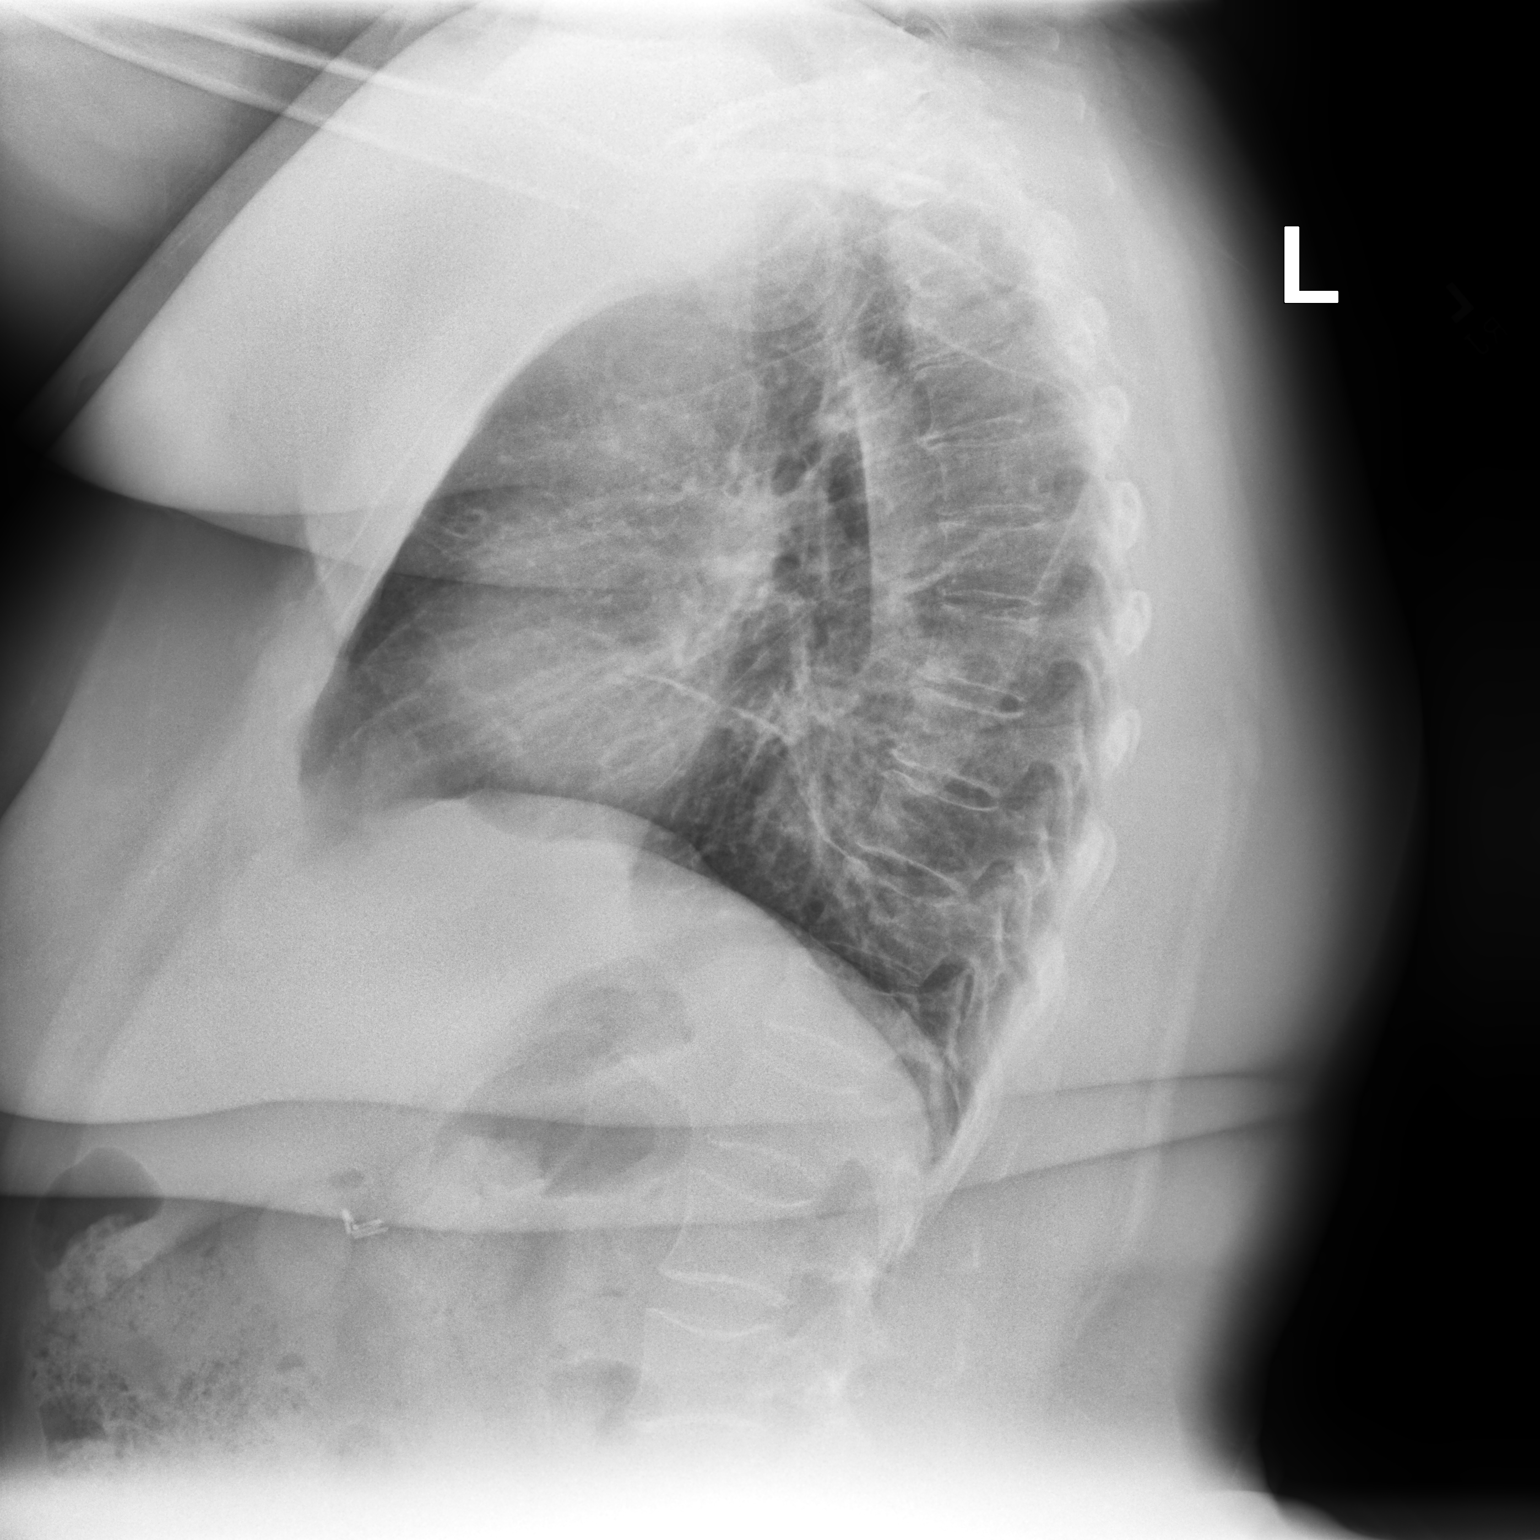

[2 of 2 positions shown; findings below may reference images not displayed]

FINDINGS: The heart size and mediastinal contours are within normal limits.
Moderate linear scar lateral left lung unchanged. Both lungs are
otherwise clear. The visualized skeletal structures are
unremarkable.
IMPRESSION: No active cardiopulmonary disease.

## 2022-07-22 ENCOUNTER — Ambulatory Visit (INDEPENDENT_AMBULATORY_CARE_PROVIDER_SITE_OTHER): Payer: 59 | Admitting: Pulmonary Disease

## 2022-07-22 ENCOUNTER — Encounter (HOSPITAL_BASED_OUTPATIENT_CLINIC_OR_DEPARTMENT_OTHER): Payer: Self-pay | Admitting: Pulmonary Disease

## 2022-07-22 ENCOUNTER — Ambulatory Visit (INDEPENDENT_AMBULATORY_CARE_PROVIDER_SITE_OTHER): Payer: 59

## 2022-07-22 VITALS — BP 118/66 | HR 104 | Temp 98.3°F | Ht 65.0 in | Wt 293.6 lb

## 2022-07-22 DIAGNOSIS — R053 Chronic cough: Secondary | ICD-10-CM

## 2022-07-22 DIAGNOSIS — J4550 Severe persistent asthma, uncomplicated: Secondary | ICD-10-CM | POA: Diagnosis not present

## 2022-07-22 DIAGNOSIS — Z862 Personal history of diseases of the blood and blood-forming organs and certain disorders involving the immune mechanism: Secondary | ICD-10-CM | POA: Diagnosis not present

## 2022-07-22 MED ORDER — MONTELUKAST SODIUM 10 MG PO TABS: 10.0000 mg | ORAL_TABLET | Freq: Every day | ORAL | 5 refills | Status: DC

## 2022-07-22 MED ORDER — LEVOCETIRIZINE DIHYDROCHLORIDE 5 MG PO TABS: 5.0000 mg | ORAL_TABLET | Freq: Every evening | ORAL | 5 refills | Status: DC

## 2022-07-22 MED ORDER — TRELEGY ELLIPTA 200-62.5-25 MCG/ACT IN AEPB: 1.0000 | INHALATION_SPRAY | Freq: Every day | RESPIRATORY_TRACT | 5 refills | Status: DC

## 2022-07-22 MED ORDER — BENZONATATE 200 MG PO CAPS: 200.0000 mg | ORAL_CAPSULE | Freq: Three times a day (TID) | ORAL | 1 refills | Status: DC | PRN

## 2022-07-22 NOTE — Patient Instructions (Signed)
Chest xray today  Trelegy one puff daily, and rinse your mouth after each use  Singulair 10 mg pill nightly  Xyzal 5 mg pill nightly  Tessalon every 8 hours as needed for cough  Stop using symbicort once you start using trelegy  Follow up in 4 weeks

## 2022-07-22 NOTE — Progress Notes (Signed)
[name]  Chief Complaint  Patient presents with   Follow-up    Pt states that she has a chronic productive cough and SOB with exertion x 3 months. She has trid to increase inhalers to x4 daily and using nebulizer machine.     Constitutional:  BP 118/66 (BP Location: Left Arm, Patient Position: Sitting, Cuff Size: Large)   Pulse (!) 104   Temp 98.3 F (36.8 C) (Oral)   Ht '5\' 5"'$  (1.651 m)   Wt 293 lb 9.6 oz (133.2 kg)   SpO2 97%   BMI 48.86 kg/m   Past Medical History:  Anemia, Depression, Uterine Fibroids, Gallstones, GERD, HSV, IBS, Migraine HA, Vit D deficiency  Past Surgical History:  Her  has a past surgical history that includes Wisdom tooth extraction; Dilatation & currettage/hysteroscopy with resectoscope (N/A, 11/05/2012); skyla iud (01-14-13); Cholecystectomy (N/A, 05/19/2013); IUD removal; Bladder surgery; Dilation and curettage of uterus (11/2013); Myomectomy (N/A, 02/17/2014); Video bronchoscopy (Bilateral, 12/04/2017); LEEP (N/A, 02/26/2018); Diagnostic laparoscopy; Dilatation & currettage/hysteroscopy with novasure ablation (N/A, 12/28/2020); and Dilatation & curettage/hysteroscopy with myosure (N/A, 12/28/2020).  Brief Summary:  Yvonne Banks is a 52 y.o. female former smoker with asthma.  She also has history of sarcoidosis.      Subjective:   She went to her father's funeral in October.  This was in [place].  After this trip she developed a terrible cough.  She was having wheeze also.  She was seen in urgent care and by her PCP.  She had 1 round of antibiotics and two rounds of prednisone.  She was also using her mother's nebulizer.  She isn't having as much sinus congestion.  Her cough is some better but still present.  Cough gets worse in the evening/nighttime.  She also gets more cough when talking too much or going from inside to outside.    Physical Exam:   Appearance - well kempt   ENMT - no sinus tenderness,  no oral exudate, no LAN, Mallampati 3 airway, no stridor  Respiratory - equal breath sounds bilaterally, no wheezing or rales  CV - s1s2 regular rate and rhythm, no murmurs  Ext - no clubbing, no edema  Skin - no rashes  Psych - normal mood and affect     Pulmonary testing:  Bronchoscopy 12/04/17 >> negative for malignancy ACE 12/08/17 >> 84 Quatiferon gold 12/08/17 >> negative Cryptococcal Ag 01/06/18 >> positive, titer 1:80 PFT 01/19/18 >> FEV1 2.11 (82%), FEV1% 88, TLC 4.27 (80%), DLCO 63% IgE 11/18/21 >> 74  Chest Imaging:  CT angio chest 12/03/17 >> borderline LAN, mass like consolidation in Rt upper and lower lobes CT chest 03/01/18 >> decreased size of apical opacities HRCT chest 06/04/18 >> decreased nodularity HRCT chest 03/09/19 >> near complete resolution of RUL opacity and nodularity with minimal bandlike scarring, minimal air trapping  Cardiac Tests:  Echo 04/08/18 >> EF 65 to 70%, grade 1 DD  Social History:  She  reports that she quit smoking about 22 years ago. Her smoking use included cigarettes. She has a 15.00 pack-year smoking history. She has never used smokeless tobacco. She reports current alcohol use. She reports that she does not use drugs.  Family History:  Her family history includes Allergies in her maternal aunt, mother, and sister; Anemia in her mother; Asthma in her maternal aunt and mother; Breast cancer in her paternal aunt; Cancer in her maternal grandmother; Hypertension in her mother; Lung cancer in her cousin;  Ulcers in her sister.     Assessment/Plan:   Allergic asthma. - she has persistent cough after upper respiratory infection in October 2023 - will try her on trelegy 200 one puff daily and xyzal 5 mg nightly - resume singulair 10 mg nightly - don't think she needs additional prednisone or antibiotics at this time - prn albuterol, tessalon   Upper airway cough. - flonase, azelastine, xyzal, singulair  Exercised induced asthma. - prn  albuterol prior to exercising  Hx of sarcoidosis. - repeat chest xray today   Time Spent Involved in Patient Care on Day of Examination:  38 minutes  Follow up:   Patient Instructions  Chest xray today  Trelegy one puff daily, and rinse your mouth after each use  Singulair 10 mg pill nightly  Xyzal 5 mg pill nightly  Tessalon every 8 hours as needed for cough  Stop using symbicort once you start using trelegy  Follow up in 4 weeks  Medication List:   Allergies as of 07/22/2022       Reactions   Other Anaphylaxis   Spicy peppers - anything hot or spicy causes swelling and skin inflammation Cilantro- throat closes, trouble breathing   Pineapple Anaphylaxis   Throat closes, tongue swells   Latex Other (See Comments), Rash   *Adhesive Tape  Discolors skin Discolors skin   Adhesive [tape] Other (See Comments)   Causes skin discoloration.   Erythromycin Nausea And Vomiting   Other reaction(s): vomiting, Can Take Zpak   Iron    Stomach pain. Pt can take Integra for iron        Medication List        Accurate as of July 22, 2022  4:48 PM. If you have any questions, ask your nurse or doctor.          STOP taking these medications    budesonide-formoterol 160-4.5 MCG/ACT inhaler Commonly known as: Symbicort Stopped by: Chesley Mires, MD   carvedilol 3.125 MG tablet Commonly known as: COREG Stopped by: Chesley Mires, MD   pantoprazole 40 MG tablet Commonly known as: PROTONIX Stopped by: Chesley Mires, MD       TAKE these medications    albuterol 108 (90 Base) MCG/ACT inhaler Commonly known as: VENTOLIN HFA Inhale 2 puffs into the lungs every 6 (six) hours as needed for wheezing or shortness of breath.   azelastine 0.1 % nasal spray Commonly known as: ASTELIN Place into both nostrils 2 (two) times daily. Use in each nostril as directed   benzonatate 200 MG capsule Commonly known as: TESSALON Take 1 capsule (200 mg total) by mouth 3 (three) times  daily as needed for cough. Started by: Chesley Mires, MD   cetirizine 10 MG tablet Commonly known as: ZYRTEC Take 10 mg by mouth daily as needed for allergies.   fluticasone 50 MCG/ACT nasal spray Commonly known as: FLONASE Place 2 sprays into both nostrils daily.   levocetirizine 5 MG tablet Commonly known as: XYZAL Take 1 tablet (5 mg total) by mouth every evening. Started by: Chesley Mires, MD   montelukast 10 MG tablet Commonly known as: SINGULAIR Take 1 tablet (10 mg total) by mouth at bedtime. Started by: Chesley Mires, MD   NON FORMULARY Take 4 sprays by mouth daily. Vitamin C drops with elderberry and zinc   promethazine 25 MG tablet Commonly known as: PHENERGAN Take 25 mg by mouth every 6 (six) hours as needed for nausea or vomiting.   rizatriptan 10 MG tablet Commonly  known as: MAXALT Take 10 mg by mouth as needed for migraine.   tiZANidine 4 MG tablet Commonly known as: ZANAFLEX Take 2 mg by mouth every 8 (eight) hours as needed for migraine.   Trelegy Ellipta 200-62.5-25 MCG/ACT Aepb Generic drug: Fluticasone-Umeclidin-Vilant Inhale 1 puff into the lungs daily in the afternoon. Started by: Chesley Mires, MD   venlafaxine XR 75 MG 24 hr capsule Commonly known as: EFFEXOR-XR Take 75 mg by mouth at bedtime.        Signature:  Chesley Mires, MD [place] Pager - [phone] 07/22/2022, 4:48 PM

## 2022-08-12 ENCOUNTER — Encounter (HOSPITAL_BASED_OUTPATIENT_CLINIC_OR_DEPARTMENT_OTHER): Payer: Self-pay

## 2022-08-12 ENCOUNTER — Ambulatory Visit (HOSPITAL_BASED_OUTPATIENT_CLINIC_OR_DEPARTMENT_OTHER): Payer: 59 | Admitting: Pulmonary Disease

## 2022-10-03 ENCOUNTER — Encounter (HOSPITAL_COMMUNITY): Payer: Self-pay | Admitting: Emergency Medicine

## 2022-10-03 ENCOUNTER — Telehealth (HOSPITAL_COMMUNITY): Payer: Self-pay

## 2022-10-03 ENCOUNTER — Ambulatory Visit (HOSPITAL_COMMUNITY)
Admission: EM | Admit: 2022-10-03 | Discharge: 2022-10-03 | Disposition: A | Discharge status: Home / Self Care | Payer: 59 | Attending: Internal Medicine | Admitting: Internal Medicine

## 2022-10-03 DIAGNOSIS — R42 Dizziness and giddiness: Secondary | ICD-10-CM | POA: Diagnosis present

## 2022-10-03 DIAGNOSIS — T50905A Adverse effect of unspecified drugs, medicaments and biological substances, initial encounter: Secondary | ICD-10-CM

## 2022-10-03 DIAGNOSIS — R55 Syncope and collapse: Secondary | ICD-10-CM

## 2022-10-03 DIAGNOSIS — E860 Dehydration: Secondary | ICD-10-CM

## 2022-10-03 LAB — POCT URINALYSIS DIPSTICK, ED / UC
Bilirubin Urine: NEGATIVE
Glucose, UA: NEGATIVE mg/dL
Ketones, ur: NEGATIVE mg/dL
Leukocytes,Ua: NEGATIVE
Nitrite: NEGATIVE
Protein, ur: NEGATIVE mg/dL
Specific Gravity, Urine: <=1.005 (ref 1.005–1.030)
Urobilinogen, UA: 0.2 mg/dL (ref 0.0–1.0)
pH: 6 (ref 5.0–8.0)

## 2022-10-03 LAB — BASIC METABOLIC PANEL
Anion gap: 11 (ref 5–15)
BUN: 5 mg/dL — ABNORMAL LOW (ref 6–20)
CO2: 26 mmol/L (ref 22–32)
Calcium: 9.5 mg/dL (ref 8.9–10.3)
Chloride: 100 mmol/L (ref 98–111)
Creatinine, Ser: 1.01 mg/dL — ABNORMAL HIGH (ref 0.44–1.00)
GFR, Estimated: >60 mL/min (ref >60)
Glucose, Bld: 82 mg/dL (ref 70–99)
Potassium: 3.9 mmol/L (ref 3.5–5.1)
Sodium: 137 mmol/L (ref 135–145)

## 2022-10-03 LAB — CBC
HCT: 41.3 % (ref 36.0–46.0)
Hemoglobin: 13.7 g/dL (ref 12.0–15.0)
MCH: 27.3 pg (ref 26.0–34.0)
MCHC: 33.2 g/dL (ref 30.0–36.0)
MCV: 82.3 fL (ref 80.0–100.0)
Platelets: 334 10*3/uL (ref 150–400)
RBC: 5.02 MIL/uL (ref 3.87–5.11)
RDW: 14.2 % (ref 11.5–15.5)
WBC: 6.4 10*3/uL (ref 4.0–10.5)
nRBC: 0 % (ref 0.0–0.2)

## 2022-10-03 LAB — CBG MONITORING, ED: Glucose-Capillary: 95 mg/dL (ref 70–99)

## 2022-10-03 MED ORDER — ONDANSETRON 4 MG PO TBDP
ORAL_TABLET | ORAL | Status: AC
  Filled 2022-10-03: qty 1

## 2022-10-03 MED ORDER — ACETAMINOPHEN 325 MG PO TABS
975.0000 mg | ORAL_TABLET | Freq: Once | ORAL | Status: AC
  Administered 2022-10-03: 975 mg via ORAL

## 2022-10-03 MED ORDER — ONDANSETRON 4 MG PO TBDP
4.0000 mg | ORAL_TABLET | Freq: Once | ORAL | Status: AC
  Administered 2022-10-03: 4 mg via ORAL

## 2022-10-03 MED ORDER — ONDANSETRON HCL 4 MG PO TABS: 4.0000 mg | ORAL_TABLET | Freq: Four times a day (QID) | ORAL | 0 refills | Status: DC

## 2022-10-03 MED ORDER — ONDANSETRON 4 MG PO TBDP: 4.0000 mg | ORAL_TABLET | Freq: Three times a day (TID) | ORAL | 0 refills | Status: DC | PRN

## 2022-10-03 MED ORDER — ACETAMINOPHEN 325 MG PO TABS
ORAL_TABLET | ORAL | Status: AC
  Filled 2022-10-03: qty 3

## 2022-10-03 NOTE — ED Triage Notes (Signed)
Pt reports that been lightheaded for a few months. Today when tried getting out of bed fell back. Reports gait unsteady and tingling on left side. Reports some nausea. Has slight headache  Reports started new weight loss medication Zepbound 5 weeks ago.

## 2022-10-03 NOTE — ED Provider Notes (Incomplete)
[place]    CSN: [id number] Arrival date & time: 10/03/22  1013      History   Chief Complaint Chief Complaint  Patient presents with   Dizziness    HPI Yvonne Banks is a 52 y.o. female.   Patient presents to urgent care for evaluation of dizziness,   Got up at 5:30am this morning to use the restroom  Got up for work at 8, sat up, and fell backwards in the bed.  She was able to get up to go to the bathroom to sit on the toilet and was very dizzy again. She states when she bends over dizziness is worse.  "I don't have any control over my body"  Drinking lots of water yesterday and electrolyte powders without relief. Then got worse this morning.   No vision changes, does not use contacts or glasses for vision corrections. No vomiting.   Zepound weight loss injections.  Was doing 2.5mg  for 4 weeks. Then went up to 5mg    History of IBS-D.  Saturday had diarrhea all day.  Yesterday a little bit. This medication has a history of making diarrhea worse but she has IBS-D at baseline.  Cramping that resolves after defication.  No dysuria.  Urinary frequency without  Reports nausea and slight headache as well.    Dizziness   Past Medical History:  Diagnosis Date   Allergy    Anemia menometrorrhaghia   history   Anginal pain ([place])    diagnosed in childhood, continues oocasionally duing adulthood   Anxiety    no meds   Arthritis    lower back and knees   Bilateral knee pain    no current prob - only bothers her when it rains   Complication of anesthesia    woke up coughing after surgery May 2014 - required inhaler in recovery and for a week after that surgery--pt thinks it was Symbicort   Depression    Fibroids    Gallstones    causing nausea and and midchest -abdominal pain   GERD (gastroesophageal reflux disease)    History - resolved treatment with diet - no meds   Herpes genitalis in women    History of chlamydia    Hypertension     IBS (irritable bowel syndrome)    diet controlled - no meds, no current prob   Insomnia    Migraines    topamax   MRSA (methicillin resistant staph aureus) culture positive    skin infection7/2013   PT STATES NO INFECTION AT PRESENT AND SHE IS FOLLOWING HER DOCTOR'S ORDERS TO USE MUPIROCIN  OINTMENT BID IN NOSE FIRST 5 DAYS OF EACH MONTH THRU DEC 2014 AND HAS HIBICLENS TO [place] THE AREAS WHERE THE INFECTION HAS BEEN.   PMS (premenstrual syndrome)    Pneumonia    Sarcoidosis    recent dx 11/2017- was hospitalized-tx w/ prednisone   Shortness of breath    with exertion     Thyromegaly    Vitamin D deficiency     Patient Active Problem List   Diagnosis Date Noted   Sarcoidosis 10/22/2018   Restrictive airway disease 01/19/2018   Cough 12/08/2017   Shortness of breath 12/08/2017   Lung mass 12/03/2017   Musculoskeletal chest pain 12/12/2015   Upper airway cough syndrome 12/11/2015   S/P myomectomy 02/17/2014   Menorrhagia 09/20/2012   Fibroids 09/20/2012   Migraines 09/07/2012   MRSA infection 06/28/2012   HSV (herpes simplex virus) anogenital infection 06/28/2012  Past Surgical History:  Procedure Laterality Date   BLADDER SURGERY     as a child urethea stretched   CHOLECYSTECTOMY N/A 05/19/2013   Procedure: LAPAROSCOPIC CHOLECYSTECTOMY WITH INTRAOPERATIVE CHOLANGIOGRAM;  Surgeon: Madilyn Hook, DO;  Location: [facility];  Service: General;  Laterality: N/A;   DIAGNOSTIC LAPAROSCOPY     lap chole   DILATATION & CURETTAGE/HYSTEROSCOPY WITH MYOSURE N/A 12/28/2020   Procedure: DILATATION & CURETTAGE/HYSTEROSCOPY WITH MYOSURE;  Surgeon: Everett Graff, MD;  Location: [facility];  Service: Gynecology;  Laterality: N/A;   DILATATION & CURRETTAGE/HYSTEROSCOPY WITH RESECTOCOPE N/A 11/05/2012   Procedure: DILATATION & CURETTAGE/HYSTEROSCOPY WITH RESECTOCOPE;  Surgeon: [name], MD;  Location: [facility];  Service: Gynecology;  Laterality: N/A;   DILATION AND CURETTAGE OF UTERUS  11/2013    DILITATION & CURRETTAGE/HYSTROSCOPY WITH NOVASURE ABLATION N/A 12/28/2020   Procedure: DILATATION & CURETTAGE/HYSTEROSCOPY WITH NOVASURE ABLATION;  Surgeon: Everett Graff, MD;  Location: [facility];  Service: Gynecology;  Laterality: N/A;   IUD REMOVAL     skyla removed 02/10/14 in doctor's office   LEEP N/A 02/26/2018   Procedure: LOOP ELECTROSURGICAL EXCISION PROCEDURE (LEEP);  Surgeon: Everett Graff, MD;  Location: [facility];  Service: Gynecology;  Laterality: N/A;   MYOMECTOMY N/A 02/17/2014   Procedure: Exploratory Laparotomy MYOMECTOMY;  Surgeon: Marvene Staff, MD;  Location: [facility];  Service: Gynecology;  Laterality: N/A;   skyla iud  01-14-13   VIDEO BRONCHOSCOPY Bilateral 12/04/2017   Procedure: VIDEO BRONCHOSCOPY WITH FLUORO;  Surgeon: Chesley Mires, MD;  [facility];  Service: Endoscopy;  Laterality: Bilateral;   WISDOM TOOTH EXTRACTION      OB History     Gravida  0   Para      Term      Preterm      AB      Living         SAB      IAB      Ectopic      Multiple      Live Births               Home Medications    Prior to Admission medications   Medication Sig Start Date End Date Taking? Authorizing Provider  ondansetron (ZOFRAN-ODT) 4 MG disintegrating tablet Take 1 tablet (4 mg total) by mouth every 8 (eight) hours as needed for nausea or vomiting. 10/03/22  Yes Talbot Grumbling, FNP  albuterol (VENTOLIN HFA) 108 (90 Base) MCG/ACT inhaler Inhale 2 puffs into the lungs every 6 (six) hours as needed for wheezing or shortness of breath. 11/18/21   Chesley Mires, MD  azelastine (ASTELIN) 0.1 % nasal spray Place into both nostrils 2 (two) times daily. Use in each nostril as directed    [provider]  benzonatate (TESSALON) 200 MG capsule Take 1 capsule (200 mg total) by mouth 3 (three) times daily as needed for cough. 07/22/22   Chesley Mires, MD  cetirizine (ZYRTEC) 10 MG tablet Take 10 mg by mouth daily as needed for allergies.     [provider]  fluticasone (FLONASE) 50 MCG/ACT nasal spray Place 2 sprays into both nostrils daily.    [provider]  Fluticasone-Umeclidin-Vilant (TRELEGY ELLIPTA) 200-62.5-25 MCG/ACT AEPB Inhale 1 puff into the lungs daily in the afternoon. 07/22/22   Chesley Mires, MD  levocetirizine (XYZAL) 5 MG tablet Take 1 tablet (5 mg total) by mouth every evening. 07/22/22   Chesley Mires, MD  montelukast (SINGULAIR) 10 MG tablet Take 1  tablet (10 mg total) by mouth at bedtime. 07/22/22   Chesley Mires, MD  NON FORMULARY Take 4 sprays by mouth daily. Vitamin C drops with elderberry and zinc    [provider]  ondansetron (ZOFRAN) 4 MG tablet Take 1 tablet (4 mg total) by mouth every 6 (six) hours. 10/03/22   Talbot Grumbling, FNP  promethazine (PHENERGAN) 25 MG tablet Take 25 mg by mouth every 6 (six) hours as needed for nausea or vomiting.    [provider]  rizatriptan (MAXALT) 10 MG tablet Take 10 mg by mouth as needed for migraine.  12/01/17   [provider]  tiZANidine (ZANAFLEX) 4 MG tablet Take 2 mg by mouth every 8 (eight) hours as needed for migraine. 01/26/18   [provider]  venlafaxine XR (EFFEXOR-XR) 75 MG 24 hr capsule Take 75 mg by mouth at bedtime.    [provider]    Family History Family History  Problem Relation Age of Onset   Anemia Mother    Hypertension Mother    Asthma Mother    Allergies Mother    Ulcers Sister    Cancer Maternal Grandmother        colon   Asthma Maternal Aunt    Allergies Maternal Aunt    Allergies Sister    Breast cancer Paternal Aunt    Lung cancer Cousin     Social History Social History   Tobacco Use   Smoking status: Former    Packs/day: 1.00    Years: 15.00    Additional pack years: 0.00    Total pack years: 15.00    Types: Cigarettes    Quit date: 07/21/2000    Years since quitting: 22.2   Smokeless tobacco: Never  Vaping Use   Vaping Use: Never used  Substance Use  Topics   Alcohol use: Yes    Alcohol/week: 0.0 standard drinks of alcohol    Comment: rare   Drug use: No     Allergies   Other, Pineapple, Latex, Adhesive [tape], Erythromycin, and Iron   Review of Systems Review of Systems  Neurological:  Positive for dizziness.     Physical Exam Triage Vital Signs ED Triage Vitals  Enc Vitals Group     BP 10/03/22 1050 118/68     Pulse Rate 10/03/22 1050 96     Resp 10/03/22 1050 18     Temp 10/03/22 1050 98 F (36.7 C)     Temp Source 10/03/22 1050 Oral     SpO2 10/03/22 1050 98 %     Weight --      Height --      Head Circumference --      Peak Flow --      Pain Score 10/03/22 1048 4     Pain Loc --      Pain Edu? --      Excl. in [place]? --    No data found.  Updated Vital Signs BP 118/68 (BP Location: Right Arm)   Pulse 96   Temp 98 F (36.7 C) (Oral)   Resp 18   SpO2 98%   Visual Acuity Right Eye Distance:   Left Eye Distance:   Bilateral Distance:    Right Eye Near:   Left Eye Near:    Bilateral Near:     Physical Exam   UC Treatments / Results  Labs (all labs ordered are listed, but only abnormal results are displayed) Labs Reviewed  BASIC METABOLIC PANEL -  Abnormal; Notable for the following components:      Result Value   BUN 5 (*)    Creatinine, Ser 1.01 (*)    All other components within normal limits  POCT URINALYSIS DIPSTICK, ED / UC - Abnormal; Notable for the following components:   Hgb urine dipstick TRACE (*)    All other components within normal limits  CBC  CBG MONITORING, ED    EKG   Radiology No results found.  Procedures Procedures (including critical care time)  Medications Ordered in UC Medications  ondansetron (ZOFRAN-ODT) disintegrating tablet 4 mg (4 mg Oral Given 10/03/22 1110)  acetaminophen (TYLENOL) tablet 975 mg (975 mg Oral Given 10/03/22 1110)    Initial Impression / Assessment and Plan / UC Course  I have reviewed the triage vital signs and the nursing  notes.  Pertinent labs & imaging results that were available during my care of the patient were reviewed by me and considered in my medical decision making (see chart for details).     *** Final Clinical Impressions(s) / UC Diagnoses   Final diagnoses:  Dizziness  Near syncope  Adverse effect of drug, initial encounter     Discharge Instructions      Please continue to drink plenty of water to stay well-hydrated.  Please schedule an appointment with your primary care provider to discuss your weight loss medication further.  Please take Zofran every 8 hours as needed for nausea and vomiting.  If you develop any new or worsening symptoms or do not improve in the next 2 to 3 days, please return.  If your symptoms are severe, please go to the emergency room.  Follow-up with your primary care provider for further evaluation and management of your symptoms as well as ongoing wellness visits.  I hope you feel better!     ED Prescriptions     Medication Sig Dispense Auth. Provider   ondansetron (ZOFRAN-ODT) 4 MG disintegrating tablet Take 1 tablet (4 mg total) by mouth every 8 (eight) hours as needed for nausea or vomiting. 20 tablet Talbot Grumbling, FNP      PDMP not reviewed this encounter.

## 2022-10-03 NOTE — Discharge Instructions (Addendum)
Please continue to drink plenty of water to stay well-hydrated.  Please schedule an appointment with your primary care provider to discuss your weight loss medication further.  Please take Zofran every 8 hours as needed for nausea and vomiting.  If you develop any new or worsening symptoms or do not improve in the next 2 to 3 days, please return.  If your symptoms are severe, please go to the emergency room.  Follow-up with your primary care provider for further evaluation and management of your symptoms as well as ongoing wellness visits.  I hope you feel better!

## 2022-10-03 NOTE — Telephone Encounter (Signed)
Zofran has been called in as Zofran 4 mg. Called patient to make her aware.

## 2022-10-09 ENCOUNTER — Encounter (INDEPENDENT_AMBULATORY_CARE_PROVIDER_SITE_OTHER): Payer: Self-pay | Admitting: Family Medicine

## 2022-10-14 ENCOUNTER — Other Ambulatory Visit (HOSPITAL_COMMUNITY): Payer: Self-pay | Admitting: Family Medicine

## 2022-10-14 ENCOUNTER — Encounter: Payer: Self-pay | Admitting: Neurology

## 2022-10-14 DIAGNOSIS — R42 Dizziness and giddiness: Secondary | ICD-10-CM

## 2022-10-15 ENCOUNTER — Ambulatory Visit
Admission: RE | Admit: 2022-10-15 | Discharge: 2022-10-15 | Disposition: A | Discharge status: Home / Self Care | Payer: 59 | Source: Ambulatory Visit | Attending: Family Medicine | Admitting: Family Medicine

## 2022-10-15 DIAGNOSIS — R42 Dizziness and giddiness: Secondary | ICD-10-CM

## 2022-10-15 MED ORDER — GADOPICLENOL 0.5 MMOL/ML IV SOLN
10.0000 mL | Freq: Once | INTRAVENOUS | Status: AC | PRN
  Administered 2022-10-15: 10 mL via INTRAVENOUS

## 2022-10-20 NOTE — Progress Notes (Unsigned)
NEUROLOGY CONSULTATION NOTE  Yvonne Banks MRN: [id number] DOB: Dec 05, 1970  Referring provider: Jonathon Jordan, MD Primary care provider: Jonathon Jordan, MD  Reason for consult:  headache, dizziness  Assessment/Plan:   Migraine without aura, with status migrainosus, intractable Vertigo - semiology seems consistent with benign paroxysmal positional vertigo.  May be migraine component as well  Migraine prevention:  Plan to start Aimovig 140mg  Migraine rescue:  Stop rizatriptan.  Start sumatriptan 100mg .  Zofran ODT 4mg  for nausea Given MRI findings, although nonspecific, would like patient evaluated for possible papilledema.  Will refer to ophthalmology.  She was recently started on low-dose furosemide as per PCP. If vertigo does not continue to improve, will refer to physical therapy for vestibular rehab. Limit use of pain relievers to no more than 2 days out of week to prevent risk of rebound or medication-overuse headache. Keep headache diary Follow up 6 months.    Subjective:  Yvonne Banks is a 52 year old female with HTN, IBS, depression and anxiety who presents for headaches and dizziness.  History supplemented by ED and primary care notes.  She had been feeling lightheaded for the past 4-5 months.  On the morning of 3/15, she woke up that morning and started feeling dizzy once she stood up.  Describes a spinning sensation.  She was seen in the ED where they thought it was related to dehydration and recent increase in Zepbound.  Dizziness persisted.  A spinning sensation triggered by movement or change in position, usually to the right, and lasting a minute.  For quite some time, she reports right sided ear pain and aural fullness.    At the same time, she began experiencing daily migraines.  She has a history of migraines that occur infrequently.  She started experiencing a pressure across the forehead, between her eyes or in the temples.  Sometimes notes a zapping pain  around her head.  Associated with nausea, photophobia, phonophobia and occasional blurred vision. Lasts 12 to 24 hours with rizatriptan.  Has been occurring daily.-  MRI of brain with and without contrast on 10/15/2022 personally reviewed and revealed somewhat tortuous optic nerves but otherwise unremarkable.    She was prescribed prednisone and amoxicillin for possible sinusitis.  Due to results of MRI suggesting possible increased intracranial pressure, she was started on furosemide.  She thinks it has helped.  Notes some blurred vision over the past year.  Denies visual obscurations or pulsatile tinnitus.  For vertigo, she has been performing the Epley maneuver at home.    Past NSAIDS/analgesics:  naproxen, tramadol Past abortive triptans:  none Past abortive ergotamine:  none Past muscle relaxants:  none Past anti-emetic:  none Past antihypertensive medications:  none Past antidepressant medications:  sertraline, paroxetine Past anticonvulsant medications:  topiramate Past anti-CGRP:  none Past vitamins/Herbal/Supplements:  none Past antihistamines/decongestants:  meclizine Other past therapies:  trigger point injections, nerve blocks  Current NSAIDS/analgesics:  ibuprofen Current triptans:  rizatriptan 10mg  Current ergotamine:  none Current anti-emetic:  Zofran-ODT 4mg , promethazine 25mg  Current muscle relaxants:  none Current Antihypertensive medications:  furosemide 20mg  daily Current Antidepressant medications:  amitriptyline 25mg  QHS, venlafaxine XR 75mg  daily Current Anticonvulsant medications:  none Current anti-CGRP:  none Current Vitamins/Herbal/Supplements:  MVI Current Antihistamines/Decongestants:  Flonase, Xyzal Other therapy:  none Birth control:  none Other medications:  Zepbound, Valium 2mg  Q6h PRN   PAST MEDICAL HISTORY: Past Medical History:  Diagnosis Date   Allergy    Anemia menometrorrhaghia   history  Anginal pain ([place])    diagnosed in childhood,  continues oocasionally duing adulthood   Anxiety    no meds   Arthritis    lower back and knees   Bilateral knee pain    no current prob - only bothers her when it rains   Complication of anesthesia    woke up coughing after surgery May 2014 - required inhaler in recovery and for a week after that surgery--pt thinks it was Symbicort   Depression    Fibroids    Gallstones    causing nausea and and midchest -abdominal pain   GERD (gastroesophageal reflux disease)    History - resolved treatment with diet - no meds   Herpes genitalis in women    History of chlamydia    Hypertension    IBS (irritable bowel syndrome)    diet controlled - no meds, no current prob   Insomnia    Migraines    topamax   MRSA (methicillin resistant staph aureus) culture positive    skin infection7/2013   PT STATES NO INFECTION AT PRESENT AND SHE IS FOLLOWING HER DOCTOR'S ORDERS TO USE MUPIROCIN  OINTMENT BID IN NOSE FIRST 5 DAYS OF EACH MONTH THRU DEC 2014 AND HAS HIBICLENS TO [place] THE AREAS WHERE THE INFECTION HAS BEEN.   PMS (premenstrual syndrome)    Pneumonia    Sarcoidosis    recent dx 11/2017- was hospitalized-tx w/ prednisone   Shortness of breath    with exertion     Thyromegaly    Vitamin D deficiency     PAST SURGICAL HISTORY: Past Surgical History:  Procedure Laterality Date   BLADDER SURGERY     as a child urethea stretched   CHOLECYSTECTOMY N/A 05/19/2013   Procedure: LAPAROSCOPIC CHOLECYSTECTOMY WITH INTRAOPERATIVE CHOLANGIOGRAM;  Surgeon: Madilyn Hook, DO;  Location: [facility];  Service: General;  Laterality: N/A;   DIAGNOSTIC LAPAROSCOPY     lap chole   DILATATION & CURETTAGE/HYSTEROSCOPY WITH MYOSURE N/A 12/28/2020   Procedure: DILATATION & CURETTAGE/HYSTEROSCOPY WITH MYOSURE;  Surgeon: Everett Graff, MD;  Location: [facility];  Service: Gynecology;  Laterality: N/A;   DILATATION & CURRETTAGE/HYSTEROSCOPY WITH RESECTOCOPE N/A 11/05/2012   Procedure: DILATATION & CURETTAGE/HYSTEROSCOPY WITH  RESECTOCOPE;  Surgeon: [name], MD;  Location: [facility];  Service: Gynecology;  Laterality: N/A;   DILATION AND CURETTAGE OF UTERUS  11/2013   DILITATION & CURRETTAGE/HYSTROSCOPY WITH NOVASURE ABLATION N/A 12/28/2020   Procedure: DILATATION & CURETTAGE/HYSTEROSCOPY WITH NOVASURE ABLATION;  Surgeon: Everett Graff, MD;  Location: [facility];  Service: Gynecology;  Laterality: N/A;   IUD REMOVAL     skyla removed 02/10/14 in doctor's office   LEEP N/A 02/26/2018   Procedure: LOOP ELECTROSURGICAL EXCISION PROCEDURE (LEEP);  Surgeon: Everett Graff, MD;  Location: [facility];  Service: Gynecology;  Laterality: N/A;   MYOMECTOMY N/A 02/17/2014   Procedure: Exploratory Laparotomy MYOMECTOMY;  Surgeon: Marvene Staff, MD;  Location: [facility];  Service: Gynecology;  Laterality: N/A;   skyla iud  01-14-13   VIDEO BRONCHOSCOPY Bilateral 12/04/2017   Procedure: VIDEO BRONCHOSCOPY WITH FLUORO;  Surgeon: Chesley Mires, MD;  [facility];  Service: Endoscopy;  Laterality: Bilateral;   WISDOM TOOTH EXTRACTION      MEDICATIONS: Current Outpatient Medications on File Prior to Visit  Medication Sig Dispense Refill   albuterol (VENTOLIN HFA) 108 (90 Base) MCG/ACT inhaler Inhale 2 puffs into the lungs every 6 (six) hours as needed for wheezing or shortness of breath. 8 g 6  azelastine (ASTELIN) 0.1 % nasal spray Place into both nostrils 2 (two) times daily. Use in each nostril as directed     benzonatate (TESSALON) 200 MG capsule Take 1 capsule (200 mg total) by mouth 3 (three) times daily as needed for cough. 30 capsule 1   cetirizine (ZYRTEC) 10 MG tablet Take 10 mg by mouth daily as needed for allergies.     fluticasone (FLONASE) 50 MCG/ACT nasal spray Place 2 sprays into both nostrils daily.     Fluticasone-Umeclidin-Vilant (TRELEGY ELLIPTA) 200-62.5-25 MCG/ACT AEPB Inhale 1 puff into the lungs daily in the afternoon. 28 each 5   levocetirizine (XYZAL) 5 MG tablet Take 1 tablet (5 mg total) by mouth  every evening. 30 tablet 5   montelukast (SINGULAIR) 10 MG tablet Take 1 tablet (10 mg total) by mouth at bedtime. 30 tablet 5   NON FORMULARY Take 4 sprays by mouth daily. Vitamin C drops with elderberry and zinc     ondansetron (ZOFRAN) 4 MG tablet Take 1 tablet (4 mg total) by mouth every 6 (six) hours. 20 tablet 0   ondansetron (ZOFRAN-ODT) 4 MG disintegrating tablet Take 1 tablet (4 mg total) by mouth every 8 (eight) hours as needed for nausea or vomiting. 20 tablet 0   promethazine (PHENERGAN) 25 MG tablet Take 25 mg by mouth every 6 (six) hours as needed for nausea or vomiting.     rizatriptan (MAXALT) 10 MG tablet Take 10 mg by mouth as needed for migraine.      tiZANidine (ZANAFLEX) 4 MG tablet Take 2 mg by mouth every 8 (eight) hours as needed for migraine.  5   venlafaxine XR (EFFEXOR-XR) 75 MG 24 hr capsule Take 75 mg by mouth at bedtime.     No current facility-administered medications on file prior to visit.    ALLERGIES: Allergies  Allergen Reactions   Other Anaphylaxis    Spicy peppers - anything hot or spicy causes swelling and skin inflammation Cilantro- throat closes, trouble breathing   Pineapple Anaphylaxis    Throat closes, tongue swells   Latex Other (See Comments) and Rash    *Adhesive Tape  Discolors skin  Discolors skin    Adhesive [Tape] Other (See Comments)    Causes skin discoloration.   Erythromycin Nausea And Vomiting    Other reaction(s): vomiting, Can Take Zpak   Iron     Stomach pain. Pt can take Integra for iron    FAMILY HISTORY: Family History  Problem Relation Age of Onset   Anemia Mother    Hypertension Mother    Asthma Mother    Allergies Mother    Ulcers Sister    Cancer Maternal Grandmother        colon   Asthma Maternal Aunt    Allergies Maternal Aunt    Allergies Sister    Breast cancer Paternal Aunt    Lung cancer Cousin     Objective:  Blood pressure 137/78, pulse (!) 117, height 5\' 5"  (1.651 m), weight 280 lb 6.4 oz  (127.2 kg), SpO2 97 %. General: No acute distress.  Patient appears well-groomed.   Head:  Normocephalic/atraumatic Eyes:  fundi examined but not visualized Neck: supple, no paraspinal tenderness, full range of motion Back: No paraspinal tenderness Heart: regular rate and rhythm Lungs: Clear to auscultation bilaterally. Vascular: No carotid bruits. Neurological Exam: Mental status: alert and oriented to person, place, and time, speech fluent and not dysarthric, language intact. Cranial nerves: CN I: not tested CN II: pupils  equal, round and reactive to light, visual fields intact CN III, IV, VI:  full range of motion, no nystagmus, no ptosis CN V: facial sensation intact. CN VII: upper and lower face symmetric CN VIII: hearing intact CN IX, X: gag intact, uvula midline CN XI: sternocleidomastoid and trapezius muscles intact CN XII: tongue midline Bulk & Tone: normal, no fasciculations. Motor:  muscle strength 5/5 throughout Sensation:  Pinprick, temperature and vibratory sensation intact. Deep Tendon Reflexes:  2+ throughout,  toes downgoing.   Finger to nose testing:  Without dysmetria.   Heel to shin:  Without dysmetria.   Gait:  Normal station and stride.  Romberg negative.    Thank you for allowing me to take part in the care of this patient.  Metta Clines, DO  CC: Jonathon Jordan, MD

## 2022-10-21 ENCOUNTER — Ambulatory Visit (INDEPENDENT_AMBULATORY_CARE_PROVIDER_SITE_OTHER): Payer: 59 | Admitting: Neurology

## 2022-10-21 ENCOUNTER — Encounter: Payer: Self-pay | Admitting: Neurology

## 2022-10-21 ENCOUNTER — Telehealth: Payer: Self-pay

## 2022-10-21 VITALS — BP 137/78 | HR 117 | Ht 65.0 in | Wt 280.4 lb

## 2022-10-21 DIAGNOSIS — G932 Benign intracranial hypertension: Secondary | ICD-10-CM | POA: Diagnosis not present

## 2022-10-21 DIAGNOSIS — H8111 Benign paroxysmal vertigo, right ear: Secondary | ICD-10-CM

## 2022-10-21 DIAGNOSIS — G43011 Migraine without aura, intractable, with status migrainosus: Secondary | ICD-10-CM | POA: Diagnosis not present

## 2022-10-21 MED ORDER — ONDANSETRON 4 MG PO TBDP: 4.0000 mg | ORAL_TABLET | Freq: Three times a day (TID) | ORAL | 5 refills | Status: AC | PRN

## 2022-10-21 MED ORDER — AIMOVIG 140 MG/ML ~~LOC~~ SOAJ: 140.0000 mg | SUBCUTANEOUS | 11 refills | Status: DC

## 2022-10-21 MED ORDER — SUMATRIPTAN SUCCINATE 100 MG PO TABS: 100.0000 mg | ORAL_TABLET | ORAL | 5 refills | Status: DC | PRN

## 2022-10-21 NOTE — Patient Instructions (Signed)
Start Aimovig 140mg  injection every 28 days Stop rizatriptan.  Instead, try sumatriptan 100mg .  Use Zofran for nausea Refer to the eye doctor Limit use of pain relievers to no more than 2 days out of week to prevent risk of rebound or medication-overuse headache. Keep headache diary

## 2022-10-21 NOTE — Telephone Encounter (Signed)
Per Patient, Hello it looks like I need prior authorization for the AIMOVIG 140MG  injection for [facility] to fill.   Thank you Renleigh Territo [id number]    PA team Please start a PA for Aimovig 140 mg

## 2022-10-28 ENCOUNTER — Telehealth: Payer: Self-pay | Admitting: Neurology

## 2022-10-28 NOTE — Telephone Encounter (Signed)
Pt called in stating she saw [facility] yesterday and they have sent [place] the notes. She would like to know what the next step is for her going forward once Dr. Everlena Cooper gets back into the office.

## 2022-11-04 ENCOUNTER — Telehealth: Payer: Self-pay

## 2022-11-04 NOTE — Telephone Encounter (Signed)
Patient Advocate Encounter  Prior Authorization for Aimovig /ML auto-injectors has been approved.    PA# [id number] Key: [employer] Electronic PA Form Effective dates: 11/04/2022 through 11/04/2023

## 2022-11-04 NOTE — Telephone Encounter (Signed)
Patient wants to speak with someone about what the next steps are

## 2022-11-05 NOTE — Telephone Encounter (Signed)
Patient advised of approval.  

## 2022-11-06 NOTE — Telephone Encounter (Signed)
Called and informed pt. Of Dr. Everlena Cooper answer. She understood but she would like to wait on the PT for now, she has been doing exercises and she is not as dizzy. She will call if she is in need of it.

## 2022-11-13 ENCOUNTER — Other Ambulatory Visit: Payer: Self-pay

## 2022-11-13 MED ORDER — SUMATRIPTAN SUCCINATE 100 MG PO TABS: 100.0000 mg | ORAL_TABLET | ORAL | 2 refills | Status: DC | PRN

## 2022-11-13 NOTE — Progress Notes (Signed)
Per [facility] 90 day supply needed for Sumatriptan 100 mg.   90 day supply sent.

## 2022-11-17 ENCOUNTER — Other Ambulatory Visit (HOSPITAL_COMMUNITY): Payer: Self-pay

## 2022-11-17 MED ORDER — TIRZEPATIDE-WEIGHT MANAGEMENT 10 MG/0.5ML ~~LOC~~ SOAJ
10.0000 mg | SUBCUTANEOUS | 0 refills | Status: DC
  Filled 2022-11-17: qty 2, 28d supply, fill #0

## 2022-11-18 ENCOUNTER — Other Ambulatory Visit (HOSPITAL_COMMUNITY): Payer: Self-pay

## 2022-12-02 ENCOUNTER — Other Ambulatory Visit: Payer: Self-pay

## 2022-12-02 ENCOUNTER — Other Ambulatory Visit (HOSPITAL_COMMUNITY): Payer: Self-pay

## 2022-12-02 MED ORDER — ZEPBOUND 12.5 MG/0.5ML ~~LOC~~ SOAJ
12.5000 mg | SUBCUTANEOUS | 0 refills | Status: DC
  Filled 2022-12-02: qty 2, 28d supply, fill #0

## 2022-12-25 ENCOUNTER — Other Ambulatory Visit (HOSPITAL_BASED_OUTPATIENT_CLINIC_OR_DEPARTMENT_OTHER): Payer: Self-pay

## 2022-12-25 MED ORDER — MONTELUKAST SODIUM 10 MG PO TABS: 10.0000 mg | ORAL_TABLET | Freq: Every day | ORAL | 1 refills | Status: DC

## 2023-01-06 ENCOUNTER — Other Ambulatory Visit (HOSPITAL_COMMUNITY): Payer: Self-pay

## 2023-01-06 MED ORDER — ZEPBOUND 15 MG/0.5ML ~~LOC~~ SOAJ
15.0000 mg | SUBCUTANEOUS | 0 refills | Status: DC
  Filled 2023-01-06: qty 2, 28d supply, fill #0
  Filled 2023-02-04: qty 2, 28d supply, fill #1
  Filled 2023-03-06: qty 2, 28d supply, fill #2

## 2023-01-10 ENCOUNTER — Other Ambulatory Visit (HOSPITAL_COMMUNITY): Payer: Self-pay

## 2023-01-20 ENCOUNTER — Ambulatory Visit: Payer: 59 | Admitting: Neurology

## 2023-02-04 ENCOUNTER — Other Ambulatory Visit (HOSPITAL_COMMUNITY): Payer: Self-pay

## 2023-03-07 ENCOUNTER — Other Ambulatory Visit (HOSPITAL_COMMUNITY): Payer: Self-pay

## 2023-04-02 ENCOUNTER — Other Ambulatory Visit (HOSPITAL_COMMUNITY): Payer: Self-pay

## 2023-04-02 NOTE — Progress Notes (Signed)
NEUROLOGY FOLLOW UP OFFICE NOTE  Yvonne Banks [id number]  Assessment/Plan:   Migraine without aura, with status migrainosus, intractable - improved frequency but still intractable Right occipital neuralgia, resolved at this time.   Migraine prevention:  Aimovig 140mg .  She just had her venlafaxine XR 150mg  daily for anxiety which may also help migraines.  If migraines not improved in 6 weeks, she will contact me and I would change Aimovig to another preventative (probably another CGRP inhibitor) that may be more effective. Migraine rescue:  Will have her try samples of Ubrelvy 100mg .  Sumatriptan 100mg .  Zofran ODT 4mg  for nausea Limit use of pain relievers to no more than 2 days out of week to prevent risk of rebound or medication-overuse headache. Keep headache diary Follow up 6 months.  Subjective:  Yvonne Banks is a 52 year old female with HTN, IBS, depression and anxiety who follows up for migraines and vertigo.  UPDATE: Started Aimovig.  Sumatriptan ineffective  Duration:  2-3 days Frequency:  every other weekend (total 7-8 days a month)  Recently, she has been experiencing shooting electric pain from the right occipital region to the temple.  This went on for 3 days.  Scalp may feel sore but no paresthesias.  Feels some neck pain with migraines but otherwise no neck pain  PCP increased her venlafaxine on 9/1 for 150mg  daily to treat her anxiety.  Current NSAIDS/analgesics:  ibuprofen Current triptans:  sumatriptan 100mg  Current ergotamine:  none Current anti-emetic:  Zofran-ODT 4mg , promethazine 25mg  Current muscle relaxants:  none Current Antihypertensive medications:  furosemide 20mg  daily Current Antidepressant medications:  amitriptyline 25mg  QHS, venlafaxine XR 150mg  daily Current Anticonvulsant medications:  none Current anti-CGRP:  Aimovig 140mg  Current Vitamins/Herbal/Supplements:  MVI Current Antihistamines/Decongestants:  Flonase, Xyzal Other  therapy:  none Birth control:  none Other medications:  Zepbound, Valium 2mg  Q6h PRN  HISTORY:  She had been feeling lightheaded for the past 4-5 months.  On the morning of 3/15, she woke up that morning and started feeling dizzy once she stood up.  Describes a spinning sensation.  She was seen in the ED where they thought it was related to dehydration and recent increase in Zepbound.  Dizziness persisted.  A spinning sensation triggered by movement or change in position, usually to the right, and lasting a minute.  For quite some time, she reports right sided ear pain and aural fullness.     At the same time, she began experiencing daily migraines.  She has a history of migraines that occur infrequently.  She started experiencing a pressure across the forehead, between her eyes or in the temples.  Sometimes notes a zapping pain around her head.  Associated with nausea, photophobia, phonophobia and occasional blurred vision. Lasts 12 to 24 hours with rizatriptan.  Has been occurring daily.-   MRI of brain with and without contrast on 10/15/2022 personally reviewed and revealed somewhat tortuous optic nerves but otherwise unremarkable.    She was prescribed prednisone and amoxicillin for possible sinusitis.  Due to results of MRI suggesting possible increased intracranial pressure, she was started on furosemide by her PCP.  She thinks it has helped.  Notes some blurred vision over the past year.  Denies visual obscurations or pulsatile tinnitus.  For vertigo, she has been performing the Epley maneuver at home.  She saw optometrist, Yvonne Banks at [facility], on 10/27/2022 whose exam revealed no evidence of active or prior papilledema.     Past NSAIDS/analgesics:  naproxen, tramadol Past abortive triptans:  rizatriptan Past abortive ergotamine:  none Past muscle relaxants:  none Past anti-emetic:  none Past antihypertensive medications:  none Past antidepressant medications:  sertraline,  paroxetine Past anticonvulsant medications:  topiramate Past anti-CGRP:  none Past vitamins/Herbal/Supplements:  none Past antihistamines/decongestants:  meclizine Other past therapies:  trigger point injections, nerve blocks     PAST MEDICAL HISTORY: Past Medical History:  Diagnosis Date   Allergy    Anemia menometrorrhaghia   history   Anginal pain (HCC)    diagnosed in childhood, continues oocasionally duing adulthood   Anxiety    no meds   Arthritis    lower back and knees   Bilateral knee pain    no current prob - only bothers her when it rains   Complication of anesthesia    woke up coughing after surgery May 2014 - required inhaler in recovery and for a week after that surgery--pt thinks it was Symbicort   Depression    Fibroids    Gallstones    causing nausea and and midchest -abdominal pain   GERD (gastroesophageal reflux disease)    History - resolved treatment with diet - no meds   Herpes genitalis in women    History of chlamydia    Hypertension    IBS (irritable bowel syndrome)    diet controlled - no meds, no current prob   Insomnia    Migraines    topamax   MRSA (methicillin resistant staph aureus) culture positive    skin infection7/2013   PT STATES NO INFECTION AT PRESENT AND SHE IS FOLLOWING HER DOCTOR'S ORDERS TO USE MUPIROCIN  OINTMENT BID IN NOSE FIRST 5 DAYS OF EACH MONTH THRU DEC 2014 AND HAS HIBICLENS TO WASH THE AREAS WHERE THE INFECTION HAS BEEN.   PMS (premenstrual syndrome)    Pneumonia    Sarcoidosis    recent dx 11/2017- was hospitalized-tx w/ prednisone   Shortness of breath    with exertion     Thyromegaly    Vitamin D deficiency     MEDICATIONS: Current Outpatient Medications on File Prior to Visit  Medication Sig Dispense Refill   albuterol (VENTOLIN HFA) 108 (90 Base) MCG/ACT inhaler Inhale 2 puffs into the lungs every 6 (six) hours as needed for wheezing or shortness of breath. 8 g 6   amitriptyline (ELAVIL) 25 MG tablet Take  25 mg by mouth at bedtime.     azelastine (ASTELIN) 0.1 % nasal spray Place into both nostrils 2 (two) times daily. Use in each nostril as directed     benzonatate (TESSALON) 200 MG capsule Take 1 capsule (200 mg total) by mouth 3 (three) times daily as needed for cough. 30 capsule 1   diazepam (VALIUM) 2 MG tablet Take 2 mg by mouth every 6 (six) hours as needed.     Erenumab-aooe (AIMOVIG) 140 MG/ML SOAJ Inject 140 mg into the skin every 28 (twenty-eight) days. 1.12 mL 11   fluconazole (DIFLUCAN) 150 MG tablet Take 150 mg by mouth as needed.     fluticasone (FLONASE) 50 MCG/ACT nasal spray Place 2 sprays into both nostrils daily.     Fluticasone-Umeclidin-Vilant (TRELEGY ELLIPTA) 200-62.5-25 MCG/ACT AEPB Inhale 1 puff into the lungs daily in the afternoon. 28 each 5   furosemide (LASIX) 20 MG tablet Take 20 mg by mouth daily.     levocetirizine (XYZAL) 5 MG tablet Take 1 tablet (5 mg total) by mouth every evening. 30 tablet 5   montelukast (SINGULAIR)  10 MG tablet Take 1 tablet (10 mg total) by mouth at bedtime. 90 tablet 1   ondansetron (ZOFRAN) 4 MG tablet Take 1 tablet (4 mg total) by mouth every 6 (six) hours. 20 tablet 0   ondansetron (ZOFRAN-ODT) 4 MG disintegrating tablet Take 1 tablet (4 mg total) by mouth every 8 (eight) hours as needed for nausea or vomiting. 20 tablet 5   promethazine (PHENERGAN) 25 MG tablet Take 25 mg by mouth every 6 (six) hours as needed for nausea or vomiting.     rizatriptan (MAXALT) 10 MG tablet Take 10 mg by mouth as needed for migraine.      SUMAtriptan (IMITREX) 100 MG tablet Take 1 tablet (100 mg total) by mouth as needed for migraine. May repeat in 2 hours if headache persists or recurs.  Maximum 2 tablets in 24 hours. 30 tablet 2   tirzepatide (ZEPBOUND) 10 MG/0.5ML Pen Inject 10 mg into the skin once a week. 2 mL 0   tirzepatide (ZEPBOUND) 12.5 MG/0.5ML Pen Inject 12.5 mg into the skin once a week. 2 mL 0   tirzepatide (ZEPBOUND) 15 MG/0.5ML Pen Inject 15  mg into the skin once a week. 6 mL 0   tiZANidine (ZANAFLEX) 4 MG tablet Take 2 mg by mouth every 8 (eight) hours as needed for migraine.  5   valACYclovir (VALTREX) 500 MG tablet Take 500 mg by mouth as needed.     venlafaxine XR (EFFEXOR-XR) 75 MG 24 hr capsule Take 75 mg by mouth at bedtime.     ZEPBOUND 7.5 MG/0.5ML Pen Inject 7.5 mg into the skin once a week.     No current facility-administered medications on file prior to visit.    ALLERGIES: Allergies  Allergen Reactions   Other Anaphylaxis    Spicy peppers - anything hot or spicy causes swelling and skin inflammation Cilantro- throat closes, trouble breathing   Pineapple Anaphylaxis    Throat closes, tongue swells   Latex Other (See Comments) and Rash    *Adhesive Tape  Discolors skin  Discolors skin    Adhesive [Tape] Other (See Comments)    Causes skin discoloration.   Erythromycin Nausea And Vomiting    Other reaction(s): vomiting, Can Take Zpak   Iron     Stomach pain. Pt can take Integra for iron    FAMILY HISTORY: Family History  Problem Relation Age of Onset   Anemia Mother    Hypertension Mother    Asthma Mother    Allergies Mother    Migraines Sister    Ulcers Sister    Migraines Sister    Allergies Sister    Migraines Maternal Aunt    Asthma Maternal Aunt    Migraines Maternal Aunt    Allergies Maternal Aunt    Breast cancer Paternal Aunt    Cancer Maternal Grandmother        colon   Lung cancer Cousin       Objective:  Blood pressure 119/82, pulse (!) 103, height 5\' 5"  (1.651 m), weight 246 lb (111.6 kg), SpO2 96%. General: No acute distress.  Patient appears well-groomed.      Shon Millet, DO  CC: Mila Palmer, MD

## 2023-04-03 ENCOUNTER — Other Ambulatory Visit (HOSPITAL_COMMUNITY): Payer: Self-pay

## 2023-04-03 MED ORDER — ZEPBOUND 15 MG/0.5ML ~~LOC~~ SOAJ
15.0000 mg | SUBCUTANEOUS | 11 refills | Status: AC
  Filled 2023-04-03: qty 2, 28d supply, fill #0
  Filled 2023-05-03: qty 2, 28d supply, fill #1
  Filled 2023-06-04: qty 2, 28d supply, fill #2
  Filled 2023-06-26 (×2): qty 2, 28d supply, fill #3
  Filled 2023-07-26: qty 2, 28d supply, fill #4
  Filled 2023-08-23: qty 2, 28d supply, fill #5
  Filled 2023-09-23: qty 2, 28d supply, fill #6
  Filled 2023-10-18: qty 2, 28d supply, fill #7
  Filled 2023-11-15: qty 2, 28d supply, fill #8
  Filled 2023-12-13: qty 2, 28d supply, fill #9
  Filled 2024-01-12: qty 2, 28d supply, fill #10
  Filled 2024-02-16: qty 2, 28d supply, fill #11
  Filled ????-??-??: fill #6
  Filled ????-??-??: fill #2

## 2023-04-06 ENCOUNTER — Other Ambulatory Visit (HOSPITAL_COMMUNITY): Payer: Self-pay

## 2023-04-06 ENCOUNTER — Ambulatory Visit (INDEPENDENT_AMBULATORY_CARE_PROVIDER_SITE_OTHER): Payer: 59 | Admitting: Neurology

## 2023-04-06 ENCOUNTER — Encounter: Payer: Self-pay | Admitting: Neurology

## 2023-04-06 VITALS — BP 119/82 | HR 103 | Ht 65.0 in | Wt 246.0 lb

## 2023-04-06 DIAGNOSIS — G43011 Migraine without aura, intractable, with status migrainosus: Secondary | ICD-10-CM

## 2023-04-06 DIAGNOSIS — M5481 Occipital neuralgia: Secondary | ICD-10-CM

## 2023-04-06 NOTE — Patient Instructions (Signed)
Continue Aimovig 140mg  every 4 weeks and venlafaxine XR 150mg  daily.  If migraines not improved in 6 weeks from now, contact me and I will switch Aimovig to another preventative (probably another monthly injection) At earliest onset of migraine, take Ubrelvy.  May repeat after 2 hours.  Maximum 2 tablets in 24 hours.  Let me know if it works or not.  You may use sumatriptan in same day as well if needed. Limit use of pain relievers to no more than 2 days out of week to prevent risk of rebound or medication-overuse headache. Keep headache diary Follow up 6 months

## 2023-04-08 ENCOUNTER — Ambulatory Visit (INDEPENDENT_AMBULATORY_CARE_PROVIDER_SITE_OTHER): Payer: 59 | Admitting: Pulmonary Disease

## 2023-04-08 ENCOUNTER — Encounter (HOSPITAL_BASED_OUTPATIENT_CLINIC_OR_DEPARTMENT_OTHER): Payer: Self-pay | Admitting: Pulmonary Disease

## 2023-04-08 VITALS — BP 124/84 | HR 93 | Ht 65.0 in | Wt 246.0 lb

## 2023-04-08 DIAGNOSIS — R0683 Snoring: Secondary | ICD-10-CM

## 2023-04-08 DIAGNOSIS — J4530 Mild persistent asthma, uncomplicated: Secondary | ICD-10-CM

## 2023-04-08 NOTE — Patient Instructions (Signed)
Will arrange for a home sleep study Will call to arrange for follow up after sleep study reviewed  

## 2023-04-08 NOTE — Progress Notes (Signed)
[facility]  Chief Complaint  Patient presents with   Asthma    Constitutional:  BP 124/84   Pulse 93   Ht 5\' 5"  (1.651 m)   Wt 246 lb (111.6 kg)   SpO2 98%   BMI 40.94 kg/m   Past Medical History:  Anemia, Depression, Uterine Fibroids, Gallstones, GERD, HSV, IBS, Migraine HA, Vit D deficiency  Past Surgical History:  Her  has a past surgical history that includes Wisdom tooth extraction; Dilatation & currettage/hysteroscopy with resectoscope (N/A, 11/05/2012); skyla iud (01-14-13); Cholecystectomy (N/A, 05/19/2013); IUD removal; Bladder surgery; Dilation and curettage of uterus (11/2013); Myomectomy (N/A, 02/17/2014); Video bronchoscopy (Bilateral, 12/04/2017); LEEP (N/A, 02/26/2018); Diagnostic laparoscopy; Dilatation & currettage/hysteroscopy with novasure ablation (N/A, 12/28/2020); and Dilatation & curettage/hysteroscopy with myosure (N/A, 12/28/2020).  Brief Summary:  Yvonne Banks is a 52 y.o. female former smoker with asthma.  She also has history of sarcoidosis.      Subjective:   Chest xray from 07/22/22 was normal.  Cough is better.  Trelegy helped but was too expensive.  Most of current symptoms are from her sinuses.  Has lost about 50 lbs.  Breathing better and not as much back pain.  Still has trouble with headaches.  Feels fatigued and sleepy during the day.  Snores, and has been told she stops breathing while asleep.  Physical Exam:   Appearance - well kempt   ENMT - no sinus tenderness, no oral exudate, no LAN, Mallampati 3 airway, no stridor  Respiratory - equal breath sounds bilaterally, no wheezing or rales  CV - s1s2 regular rate and rhythm, no murmurs  Ext - no clubbing, no edema  Skin - no rashes  Psych - normal mood and affect     Pulmonary testing:  Bronchoscopy 12/04/17 >> negative for malignancy ACE 12/08/17 >> 84 Quatiferon gold 12/08/17 >> negative Cryptococcal Ag 01/06/18 >> positive, titer  1:80 PFT 01/19/18 >> FEV1 2.11 (82%), FEV1% 88, TLC 4.27 (80%), DLCO 63% IgE 11/18/21 >> 74  Chest Imaging:  CT angio chest 12/03/17 >> borderline LAN, mass like consolidation in Rt upper and lower lobes CT chest 03/01/18 >> decreased size of apical opacities HRCT chest 06/04/18 >> decreased nodularity HRCT chest 03/09/19 >> near complete resolution of RUL opacity and nodularity with minimal bandlike scarring, minimal air trapping  Sleep testing:    Cardiac Tests:  Echo 04/08/18 >> EF 65 to 70%, grade 1 DD  Social History:  She  reports that she quit smoking about 22 years ago. Her smoking use included cigarettes. She started smoking about 37 years ago. She has a 15 pack-year smoking history. She has never used smokeless tobacco. She reports current alcohol use. She reports that she does not use drugs.  Family History:  Her family history includes Allergies in her maternal aunt, mother, and sister; Anemia in her mother; Asthma in her maternal aunt and mother; Breast cancer in her paternal aunt; Cancer in her maternal grandmother; Hypertension in her mother; Lung cancer in her cousin; Migraines in her maternal aunt, maternal aunt, sister, and sister; Ulcers in her sister.     Assessment/Plan:   Snoring. - associated with sleep disruption, apnea and daytime sleepiness - she has history of depression and migraine headaches - her BMI is > 35 - I am concerned she could have obstructive sleep apnea - will arrange for a home sleep study to assess further  Allergic asthma. - trelegy helped but was cost prohibitive -  continue singulair 10 mg nightly - prn albuterol - needs augmented therapy when she gets respiratory infections  Upper airway cough. - flonase, azelastine, xyzal, singulair  Exercised induced asthma. - prn albuterol prior to exercising  Hx of sarcoidosis. - monitor clinically   Time Spent Involved in Patient Care on Day of Examination:  35 minutes  Follow up:    Patient Instructions  Will arrange for a home sleep study Will call to arrange for follow up after sleep study reviewed   Medication List:   Allergies as of 04/08/2023       Reactions   Other Anaphylaxis   Spicy peppers - anything hot or spicy causes swelling and skin inflammation Cilantro- throat closes, trouble breathing   Pineapple Anaphylaxis   Throat closes, tongue swells   Latex Other (See Comments), Rash   *Adhesive Tape  Discolors skin Discolors skin   Adhesive [tape] Other (See Comments)   Causes skin discoloration.   Erythromycin Nausea And Vomiting   Other reaction(s): vomiting, Can Take Zpak   Iron    Stomach pain. Pt can take Integra for iron        Medication List        Accurate as of April 08, 2023  1:18 PM. If you have any questions, ask your nurse or doctor.          STOP taking these medications    rizatriptan 10 MG tablet Commonly known as: MAXALT Stopped by: Coralyn Helling   Trelegy Ellipta 200-62.5-25 MCG/ACT Aepb Generic drug: Fluticasone-Umeclidin-Vilant Stopped by: Coralyn Helling       TAKE these medications    Aimovig 140 MG/ML Soaj Generic drug: Erenumab-aooe Inject 140 mg into the skin every 28 (twenty-eight) days.   albuterol 108 (90 Base) MCG/ACT inhaler Commonly known as: VENTOLIN HFA Inhale 2 puffs into the lungs every 6 (six) hours as needed for wheezing or shortness of breath.   amitriptyline 25 MG tablet Commonly known as: ELAVIL Take 25 mg by mouth at bedtime.   azelastine 0.1 % nasal spray Commonly known as: ASTELIN Place into both nostrils 2 (two) times daily. Use in each nostril as directed   fluconazole 150 MG tablet Commonly known as: DIFLUCAN Take 150 mg by mouth as needed.   fluticasone 50 MCG/ACT nasal spray Commonly known as: FLONASE Place 2 sprays into both nostrils daily.   levocetirizine 5 MG tablet Commonly known as: XYZAL Take 1 tablet (5 mg total) by mouth every evening.   montelukast  10 MG tablet Commonly known as: SINGULAIR Take 1 tablet (10 mg total) by mouth at bedtime.   ondansetron 4 MG disintegrating tablet Commonly known as: ZOFRAN-ODT Take 1 tablet (4 mg total) by mouth every 8 (eight) hours as needed for nausea or vomiting.   ondansetron 4 MG tablet Commonly known as: ZOFRAN Take 1 tablet (4 mg total) by mouth every 6 (six) hours.   promethazine 25 MG tablet Commonly known as: PHENERGAN Take 25 mg by mouth every 6 (six) hours as needed for nausea or vomiting.   SUMAtriptan 100 MG tablet Commonly known as: IMITREX Take 1 tablet (100 mg total) by mouth as needed for migraine. May repeat in 2 hours if headache persists or recurs.  Maximum 2 tablets in 24 hours.   tiZANidine 4 MG tablet Commonly known as: ZANAFLEX Take 2 mg by mouth every 8 (eight) hours as needed for migraine.   valACYclovir 500 MG tablet Commonly known as: VALTREX Take 500 mg by mouth as  needed.   venlafaxine XR 150 MG 24 hr capsule Commonly known as: EFFEXOR-XR Take 150 mg by mouth daily.   Zepbound 15 MG/0.5ML Pen Generic drug: tirzepatide Inject 15 mg into the skin once a week.        Signature:  Coralyn Helling, MD [facility] Pager - [phone] 04/08/2023, 1:18 PM

## 2023-04-22 ENCOUNTER — Other Ambulatory Visit (HOSPITAL_BASED_OUTPATIENT_CLINIC_OR_DEPARTMENT_OTHER): Payer: Self-pay

## 2023-05-06 ENCOUNTER — Other Ambulatory Visit (HOSPITAL_COMMUNITY): Payer: Self-pay

## 2023-05-17 ENCOUNTER — Encounter: Payer: Self-pay | Admitting: Neurology

## 2023-05-18 ENCOUNTER — Other Ambulatory Visit: Payer: Self-pay | Admitting: Neurology

## 2023-05-18 ENCOUNTER — Telehealth: Payer: Self-pay

## 2023-05-18 ENCOUNTER — Telehealth: Payer: Self-pay | Admitting: Pulmonary Disease

## 2023-05-18 DIAGNOSIS — G4733 Obstructive sleep apnea (adult) (pediatric): Secondary | ICD-10-CM | POA: Diagnosis not present

## 2023-05-18 MED ORDER — UBRELVY 100 MG PO TABS
1.0000 | ORAL_TABLET | ORAL | 11 refills | Status: DC | PRN
Start: 2023-05-18 — End: 2024-05-04

## 2023-05-18 NOTE — Telephone Encounter (Signed)
VS pt HST showed very mild  OSA with AHI 7/ hr Suggest OV with APP to discuss options -virtual visit should be okay

## 2023-05-18 NOTE — Telephone Encounter (Signed)
Patient advised Via [employer].

## 2023-05-19 NOTE — Telephone Encounter (Signed)
I left a message for the patient to return my call.

## 2023-05-20 NOTE — Telephone Encounter (Signed)
Patient scheduled 05/22/2023 with Micheline Maze NP.

## 2023-05-22 ENCOUNTER — Telehealth: Payer: 59 | Admitting: Nurse Practitioner

## 2023-05-22 ENCOUNTER — Encounter: Payer: Self-pay | Admitting: Nurse Practitioner

## 2023-05-22 DIAGNOSIS — G4733 Obstructive sleep apnea (adult) (pediatric): Secondary | ICD-10-CM | POA: Diagnosis not present

## 2023-05-22 DIAGNOSIS — J45909 Unspecified asthma, uncomplicated: Secondary | ICD-10-CM | POA: Insufficient documentation

## 2023-05-22 DIAGNOSIS — F5101 Primary insomnia: Secondary | ICD-10-CM

## 2023-05-22 DIAGNOSIS — G4700 Insomnia, unspecified: Secondary | ICD-10-CM | POA: Insufficient documentation

## 2023-05-22 DIAGNOSIS — J4530 Mild persistent asthma, uncomplicated: Secondary | ICD-10-CM | POA: Diagnosis not present

## 2023-05-22 DIAGNOSIS — R058 Other specified cough: Secondary | ICD-10-CM | POA: Diagnosis not present

## 2023-05-22 DIAGNOSIS — R42 Dizziness and giddiness: Secondary | ICD-10-CM

## 2023-05-22 DIAGNOSIS — D869 Sarcoidosis, unspecified: Secondary | ICD-10-CM

## 2023-05-22 HISTORY — DX: Obstructive sleep apnea (adult) (pediatric): G47.33

## 2023-05-22 MED ORDER — ZOLPIDEM TARTRATE 5 MG PO TABS
5.0000 mg | ORAL_TABLET | Freq: Every evening | ORAL | 5 refills | Status: AC | PRN
Start: 2023-05-22 — End: ?

## 2023-05-22 NOTE — Progress Notes (Signed)
Patient ID: Yvonne Banks, female     DOB: 31-Mar-1971, 52 y.o.      MRN: [id number]  No chief complaint on file.   Virtual Visit via Video Note  I connected with Yvonne Banks on 05/22/23 at  1:30 PM EDT by a video enabled telemedicine application and verified that I am speaking with the correct person using two identifiers.  Location: Patient: Home Provider: Office   I discussed the limitations of evaluation and management by telemedicine and the availability of in person appointments. The patient expressed understanding and agreed to proceed.  History of Present Illness: 52 year old female, former smoker followed for allergic asthma, upper airway cough, hx of sarcoid and OSA. Past medical history significant for migraines, restrictive airway disease, fibroids, obesity, HSV.   TESTS/EVENTS: 01/19/2018 PFT: FVC 75, FEV1 82, ratio 89, TLC 80, DLCOcor 69. No BD 07/22/2022 CXR: lungs are clear. Aorta calcified 03/19/2023 HST: AHI 7.2, SpO2 low 81%  04/08/2023: OV with Dr. Craige Cotta. CXR normal from 07/22/2022. Cough better. Trelegy helped but too expensive. Most symptoms from sinus. Has lost 50 lb. Breathing better and not much back pain. Still having trouble with headaches. Feels fatigued and sleepy during the day. Snores and has been told she stops breathing. HST ordered. Continue singulair, PRN albuterol, flonase, azelastine, xyzal. Monitor clinically with hx of sarcoid.   05/22/2023: Today - follow up Discussed the use of AI scribe software for clinical note transcription with the patient, who gave verbal consent to proceed.  The patient, a 52 year old with a history of sarcoidosis, allergic asthma, upper airway cough, and excessive daytime sleepiness, presents for a follow-up consultation regarding her recent home sleep study. She reports persistent fatigue and sleepiness during the day. No worsening of symptoms since her last visit. She also mentions experiencing vertigo, which she  attributes to possible sinus issues related to seasonal changes. No visual changes, weakness, syncope. Has been ongoing last week. Has not been seen for this.   The patient's sleep study results indicate mild sleep apnea, with an average of 7 events per hour; primarily hypopneas. She expresses reluctance to use a CPAP machine due to discomfort with having something on her face.  In addition to sleep apnea, the patient reports frequent nocturnal awakenings, despite falling asleep easily. She typically sleeps on her left side and wakes up two to three times per night. She has a history of using Ambien for sleep issues over ten years ago, which she stopped after starting a new job. She is open to trying Ambien again to improve her sleep quality. No sleep parasomnias/paralysis. No drowsy driving.   The patient also has a history of allergic asthma and is currently on Singulair and albuterol as needed. She reports no current wheezing or coughing. Infrequent use of SABA. She was previously prescribed a daily inhaler (Trelegy), but due to the high cost, she has not filled the prescription. She is managing her asthma symptoms without the daily inhaler and will consider reevaluating her asthma management if she starts using albuterol on a daily basis or if her symptoms worsen.  The patient's sarcoidosis is currently stable, with no active signs on her last chest x-ray. She is also managing sinus symptoms well at this time.       Allergies  Allergen Reactions   Other Anaphylaxis    Spicy peppers - anything hot or spicy causes swelling and skin inflammation Cilantro- throat closes, trouble breathing   Pineapple Anaphylaxis  Throat closes, tongue swells   Latex Other (See Comments) and Rash    *Adhesive Tape  Discolors skin  Discolors skin    Adhesive [Tape] Other (See Comments)    Causes skin discoloration.   Erythromycin Nausea And Vomiting    Other reaction(s): vomiting, Can Take Zpak   Iron      Stomach pain. Pt can take Integra for iron   Immunization History  Administered Date(s) Administered   Influenza,inj,Quad PF,6+ Mos 04/06/2018, 03/10/2019, 05/21/2020   Influenza,inj,quad, With Preservative 04/20/2017   Influenza-Unspecified 05/21/2020   PFIZER(Purple Top)SARS-COV-2 Vaccination 10/09/2019, 10/27/2019, 05/21/2020   Pneumococcal Conjugate-13 04/06/2018   Past Medical History:  Diagnosis Date   Allergy    Anemia menometrorrhaghia   history   Anginal pain (HCC)    diagnosed in childhood, continues oocasionally duing adulthood   Anxiety    no meds   Arthritis    lower back and knees   Bilateral knee pain    no current prob - only bothers her when it rains   Complication of anesthesia    woke up coughing after surgery May 2014 - required inhaler in recovery and for a week after that surgery--pt thinks it was Symbicort   Depression    Fibroids    Gallstones    causing nausea and and midchest -abdominal pain   GERD (gastroesophageal reflux disease)    History - resolved treatment with diet - no meds   Herpes genitalis in women    History of chlamydia    Hypertension    IBS (irritable bowel syndrome)    diet controlled - no meds, no current prob   Insomnia    Migraines    topamax   MRSA (methicillin resistant staph aureus) culture positive    skin infection7/2013   PT STATES NO INFECTION AT PRESENT AND SHE IS FOLLOWING HER DOCTOR'S ORDERS TO USE MUPIROCIN  OINTMENT BID IN NOSE FIRST 5 DAYS OF EACH MONTH THRU DEC 2014 AND HAS HIBICLENS TO WASH THE AREAS WHERE THE INFECTION HAS BEEN.   PMS (premenstrual syndrome)    Pneumonia    Sarcoidosis    recent dx 11/2017- was hospitalized-tx w/ prednisone   Shortness of breath    with exertion     Thyromegaly    Vitamin D deficiency     Tobacco History: Social History   Tobacco Use  Smoking Status Former   Current packs/day: 0.00   Average packs/day: 1 pack/day for 15.0 years (15.0 ttl pk-yrs)   Types:  Cigarettes   Start date: 07/21/1985   Quit date: 07/21/2000   Years since quitting: 22.8  Smokeless Tobacco Never   Counseling given: Not Answered   Outpatient Medications Prior to Visit  Medication Sig Dispense Refill   Ubrogepant (UBRELVY) 100 MG TABS Take 1 tablet (100 mg total) by mouth as needed. May repeat after 2 hours.  Maximum 2 tablets in 24 hours. 16 tablet 11   albuterol (VENTOLIN HFA) 108 (90 Base) MCG/ACT inhaler Inhale 2 puffs into the lungs every 6 (six) hours as needed for wheezing or shortness of breath. 8 g 6   amitriptyline (ELAVIL) 25 MG tablet Take 25 mg by mouth at bedtime.     azelastine (ASTELIN) 0.1 % nasal spray Place into both nostrils 2 (two) times daily. Use in each nostril as directed     Erenumab-aooe (AIMOVIG) 140 MG/ML SOAJ Inject 140 mg into the skin every 28 (twenty-eight) days. 1.12 mL 11   fluconazole (DIFLUCAN) 150 MG tablet  Take 150 mg by mouth as needed.     fluticasone (FLONASE) 50 MCG/ACT nasal spray Place 2 sprays into both nostrils daily.     levocetirizine (XYZAL) 5 MG tablet Take 1 tablet (5 mg total) by mouth every evening. 30 tablet 5   montelukast (SINGULAIR) 10 MG tablet Take 1 tablet (10 mg total) by mouth at bedtime. 90 tablet 1   ondansetron (ZOFRAN) 4 MG tablet Take 1 tablet (4 mg total) by mouth every 6 (six) hours. 20 tablet 0   ondansetron (ZOFRAN-ODT) 4 MG disintegrating tablet Take 1 tablet (4 mg total) by mouth every 8 (eight) hours as needed for nausea or vomiting. 20 tablet 5   promethazine (PHENERGAN) 25 MG tablet Take 25 mg by mouth every 6 (six) hours as needed for nausea or vomiting.     SUMAtriptan (IMITREX) 100 MG tablet Take 1 tablet (100 mg total) by mouth as needed for migraine. May repeat in 2 hours if headache persists or recurs.  Maximum 2 tablets in 24 hours. 30 tablet 2   tirzepatide (ZEPBOUND) 15 MG/0.5ML Pen Inject 15 mg into the skin once a week. 2 mL 11   tiZANidine (ZANAFLEX) 4 MG tablet Take 2 mg by mouth every 8  (eight) hours as needed for migraine.  5   valACYclovir (VALTREX) 500 MG tablet Take 500 mg by mouth as needed.     venlafaxine XR (EFFEXOR-XR) 150 MG 24 hr capsule Take 150 mg by mouth daily.     No facility-administered medications prior to visit.     Review of Systems:   Constitutional: No night sweats, fevers, chills, or lassitude. +intentional weight loss, fatigue  HEENT: No difficulty swallowing, tooth/dental problems, or sore throat. +nasal congestion, occasional sneeze, vertigo, chronic headaches CV:  No chest pain, orthopnea, PND, swelling in lower extremities, anasarca, dizziness, palpitations, syncope Resp: +snoring. No shortness of breath with exertion or at rest. No excess mucus or change in color of mucus. No productive or non-productive. No hemoptysis. No wheezing.  No chest wall deformity GI:  No heartburn, indigestion, abdominal pain, nausea, vomiting, diarrhea, change in bowel habits, loss of appetite, bloody stools.  GU: No dysuria, change in color of urine, urgency or frequency.   Skin: No rash, lesions, ulcerations MSK:  No joint pain or swelling.   Neuro: No dizziness or lightheadedness.  Psych: No depression or anxiety. Mood stable. +sleep disturbance  Observations/Objective: Patient is well-developed, well-nourished in no acute distress. Resting comfortably at home. A&Ox3. Unlabored breathing.  Speech is clear and coherent with logical content.    Assessment and Plan:    Mild Obstructive Sleep Apnea/Insomnia Mild obstructive sleep apnea with approximately seven events per hour, primarily hypopneas. Symptoms include daytime fatigue and snoring. Discussed treatment options including CPAP therapy, oral appliance, positional sleeping and lifestyle modifications. Patient prefers to avoid CPAP due to discomfort. Reviewed risks of untreated OSA. Her apnea burden is minimal. She would like to invesigate cost of an oral appliance. Emphasized the importance of weight loss  and sideline sleeping position. Informed about potential worsening of sleep apnea with sleep aids like Ambien and symptoms to monitor for. Sleep hygiene reviewed - Continue sideline sleeping position - Continue weight loss measures - Refer to orthodontist for oral appliance consultation - Prescribe Ambien 5 mg PRN at bedtime. Side effect profile reviewed. Understands to not drive after taking and notify of any changes in sleep habits  - Follow up in 6-8 weeks to assess response to Ambien  Allergic Asthma  Allergic asthma managed with Singulair and as-needed albuterol. Patient reports difficulty affording Trelegy inhaler. No current daily inhaler use. No recent exacerbations or significant symptoms. Discussed reevaluation for a more cost-effective daily inhaler if albuterol use increases or symptoms worsen. 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 - Continue monitoring for symptoms - No immediate intervention required  Vertigo Intermittent episodes of vertigo, likely related to sinus issues and fluid buildup in the inner ear. Advised over-the-counter meclizine for symptom management. Explained that further evaluation by primary care physician is necessary if symptoms persist or worsen. - Recommend over-the-counter meclizine - Advise follow-up with primary care physician if symptoms persist or worsen  Follow-up - Follow up in 6-8 weeks to assess response to Ambien - Call if any issues arise with new medication or if symptoms change.     If symptoms do not improve or worsen, please contact office for sooner follow up or seek emergency care.    I discussed the assessment and treatment  plan with the patient. The patient was provided an opportunity to ask questions and all were answered. The patient agreed with the plan and demonstrated an understanding of the instructions.   The patient was advised to call back or seek an in-person evaluation if the symptoms worsen or if the condition fails to improve as anticipated.  I provided 31 minutes of non-face-to-face time during this encounter.   Noemi Chapel, NP

## 2023-05-22 NOTE — Progress Notes (Signed)
Make sure follow up

## 2023-05-22 NOTE — Patient Instructions (Addendum)
Sleep study showed mild sleep apnea. We discussed how untreated sleep apnea puts an individual at risk for cardiac arrhthymias, pulm HTN, DM, stroke and increases their risk for daytime accidents; although, these risks are lessened with mild severity. We also briefly reviewed treatment options including weight loss, side sleeping position, oral appliance, CPAP therapy. Referral to orthodontics for oral appliance fitting   Healthy weight loss encouraged  Start Ambien 5 mg At bedtime as needed for sleep. Do not drive after taking. May cause worsening sleep apnea so monitor for increased daytime fatigue, poor sleep quality, increased snoring, etc. If you develop changes in sleep habits, stop and notify. If you develop worsening mood, stop and notify. Do not take with alcohol or other sedating medications   You can try over the counter meclizine for vertigo. Continue your current sinus regimen. If no improvement, see your primary care provider. Go to the ED if symptoms drastically worsen  Continue singulair At bedtime  Continue Albuterol inhaler 2 puffs every 6 hours as needed for shortness of breath or wheezing. Notify if symptoms persist despite rescue inhaler/neb use. Continue xyzal daily  Continue flonase and astelin nasal sprays   Follow up in 6-8 weeks with Dr. Vassie Loll, Dr. Wynona Neat or Katie Rheda Kassab,NP. If symptoms do not improve or worsen, please contact office for sooner follow up or seek emergency care.

## 2023-06-03 ENCOUNTER — Telehealth: Payer: Self-pay

## 2023-06-03 NOTE — Telephone Encounter (Signed)
Pr patient Yvonne Banks needs a PA.

## 2023-06-04 ENCOUNTER — Other Ambulatory Visit (HOSPITAL_COMMUNITY): Payer: Self-pay

## 2023-06-05 ENCOUNTER — Other Ambulatory Visit (HOSPITAL_COMMUNITY): Payer: Self-pay

## 2023-06-05 ENCOUNTER — Telehealth: Payer: Self-pay

## 2023-06-05 NOTE — Telephone Encounter (Signed)
*  [facility]   Pharmacy Patient Advocate Encounter  Received notification from [facility] that Prior Authorization for Ubrelvy 100MG  tablets  has been APPROVED from 06/05/2023 to 06/03/2024   PA #/Case ID/Reference #: [id number]

## 2023-06-05 NOTE — Telephone Encounter (Signed)
PA request has been Approved. New Encounter created for follow up. For additional info see Pharmacy Prior Auth telephone encounter from 11/15.

## 2023-06-26 ENCOUNTER — Other Ambulatory Visit: Payer: Self-pay

## 2023-06-26 ENCOUNTER — Other Ambulatory Visit (HOSPITAL_COMMUNITY): Payer: Self-pay

## 2023-06-29 ENCOUNTER — Other Ambulatory Visit (HOSPITAL_COMMUNITY): Payer: Self-pay

## 2023-07-01 ENCOUNTER — Other Ambulatory Visit (HOSPITAL_COMMUNITY): Payer: Self-pay

## 2023-07-03 ENCOUNTER — Telehealth (HOSPITAL_BASED_OUTPATIENT_CLINIC_OR_DEPARTMENT_OTHER): Payer: Self-pay | Admitting: Pulmonary Disease

## 2023-07-03 MED ORDER — AMOXICILLIN-POT CLAVULANATE 875-125 MG PO TABS
1.0000 | ORAL_TABLET | Freq: Two times a day (BID) | ORAL | 0 refills | Status: DC
Start: 2023-07-03 — End: 2023-09-10

## 2023-07-03 MED ORDER — FLUCONAZOLE 150 MG PO TABS: 150.0000 mg | ORAL_TABLET | Freq: Every day | ORAL | 0 refills | Status: AC

## 2023-07-03 NOTE — Telephone Encounter (Signed)
[facility] After Hours Telephone Encounter  She reports headache, facial tenderness, neck soreness but no palpable lymph nodes, right nasal congestion, nonproductive, chills x 5 days that worsened.   Acute sinusitis Augmentin BID x 7 days ordered Diflucan for anticipated vaginal candidiasis

## 2023-07-07 ENCOUNTER — Telehealth: Payer: Self-pay | Admitting: Nurse Practitioner

## 2023-07-07 MED ORDER — MOLNUPIRAVIR 200 MG PO CAPS: 4.0000 | ORAL_CAPSULE | Freq: Two times a day (BID) | ORAL | 0 refills | Status: AC

## 2023-07-07 NOTE — Telephone Encounter (Signed)
Patient has tested positive for COVID. She would like to know what type of medications she needs to take for COVID. Call back number [phone]  Pharmacy: [facility] [address]

## 2023-07-07 NOTE — Telephone Encounter (Signed)
Patient is already on antibiotics and is wondering if she needs to continue taking them or not.

## 2023-07-07 NOTE — Telephone Encounter (Signed)
-  I'll send in antiviral molnupiravir.  -Ok to continue Augmentin -Continue Symbicort two puffs morning and evening -Use Albuterol every 4-6 hours for breakthrough sob/wheezing -Take mucinex-dm for congestion/cugh

## 2023-07-07 NOTE — Telephone Encounter (Signed)
Called and spoke with the pt  She started with aches, chills, swollen lymph nodes in neck on 07/03/23  She called in and spoke with Dr Everardo All 07/03/23 and augmentin prescribed- she is still taking this  She is not feeling better today so took a covid test at home and it was pos She is blowing out green nasal d/c and has cough with yellow sputum  She was wheezing some, so increased her symbicort dose to 2 puffs bid and this has helped  Pt asking if she needs to continue augmentin or take something else since covid pos Please advise, thanks!  Allergies  Allergen Reactions   Other Anaphylaxis    Spicy peppers - anything hot or spicy causes swelling and skin inflammation Cilantro- throat closes, trouble breathing   Pineapple Anaphylaxis    Throat closes, tongue swells   Latex Other (See Comments) and Rash    *Adhesive Tape  Discolors skin  Discolors skin    Adhesive [Tape] Other (See Comments)    Causes skin discoloration.   Erythromycin Nausea And Vomiting    Other reaction(s): vomiting, Can Take Zpak   Iron     Stomach pain. Pt can take Integra for iron

## 2023-07-07 NOTE — Telephone Encounter (Signed)
Called and spoke with the pt  She is aware of response from [facility]  Nothing further needed

## 2023-07-28 ENCOUNTER — Other Ambulatory Visit (HOSPITAL_COMMUNITY): Payer: Self-pay

## 2023-07-28 ENCOUNTER — Encounter (HOSPITAL_BASED_OUTPATIENT_CLINIC_OR_DEPARTMENT_OTHER): Payer: Self-pay | Admitting: Pulmonary Disease

## 2023-07-30 NOTE — Telephone Encounter (Signed)
 Pls call PT back., Also, see [facility] message,

## 2023-07-31 ENCOUNTER — Telehealth (HOSPITAL_BASED_OUTPATIENT_CLINIC_OR_DEPARTMENT_OTHER): Payer: 59 | Admitting: Pulmonary Disease

## 2023-07-31 ENCOUNTER — Encounter (HOSPITAL_BASED_OUTPATIENT_CLINIC_OR_DEPARTMENT_OTHER): Payer: Self-pay | Admitting: Pulmonary Disease

## 2023-07-31 DIAGNOSIS — R052 Subacute cough: Secondary | ICD-10-CM | POA: Diagnosis not present

## 2023-07-31 DIAGNOSIS — U071 COVID-19: Secondary | ICD-10-CM

## 2023-07-31 MED ORDER — HYDROCOD POLI-CHLORPHE POLI ER 10-8 MG/5ML PO SUER: 5.0000 mL | Freq: Every evening | ORAL | 0 refills | Status: DC | PRN

## 2023-07-31 MED ORDER — PREDNISONE 20 MG PO TABS: 20.0000 mg | ORAL_TABLET | Freq: Every day | ORAL | 0 refills | Status: AC

## 2023-07-31 NOTE — Progress Notes (Signed)
 Virtual Visit via Video Note  I connected with Yvonne Banks on 07/31/23 at  3:30 PM EST by a video enabled telemedicine application and verified that I am speaking with the correct person using two identifiers.  Location: Patient: Home Provider: Home   I discussed the limitations of evaluation and management by telemedicine and the availability of in person appointments. The patient expressed understanding and agreed to proceed.   I discussed the assessment and treatment plan with the patient. The patient was provided an opportunity to ask questions and all were answered. The patient agreed with the plan and demonstrated an understanding of the instructions.   The patient was advised to call back or seek an in-person evaluation if the symptoms worsen or if the condition fails to improve as anticipated.  I provided 30 minutes of non-face-to-face time during this encounter.   Draycen Leichter Slater Staff, MD   Subjective:   PATIENT ID: Yvonne Banks GENDER: female DOB: January 03, 1971, MRN: [id number]  Chief Complaint  Patient presents with   Follow-up    Acute visit. Recent COVID   Reason for Visit: Follow-up. Former Dr. Shellia patient  Ms. Malijah Lietz is a 53 year old female with OSA, asthma, hx sarcoid, migraines who presents for acute video visit.  Recent prescribed Augment for sinus infection and molnupiravir  for covid infection last month. Has completed medications at the end of month. Wheezing has resolved. She complains of persistent cough that seems to have worsened triggered by speech, cold. Interrupting her sleep at night. Compliant with Symbicort . Tried OTC cough syrup. Tessalon  perls are not effective. She does require to talk at work and this has worsened her hoarseness and cough.  I have personally reviewed patient's past medical/family/social history, allergies, current medications.  Past Medical History:  Diagnosis Date   Allergy    Anemia menometrorrhaghia    history   Anginal pain (HCC)    diagnosed in childhood, continues oocasionally duing adulthood   Anxiety    no meds   Arthritis    lower back and knees   Bilateral knee pain    no current prob - only bothers her when it rains   Complication of anesthesia    woke up coughing after surgery May 2014 - required inhaler in recovery and for a week after that surgery--pt thinks it was Symbicort    Depression    Fibroids    Gallstones    causing nausea and and midchest -abdominal pain   GERD (gastroesophageal reflux disease)    History - resolved treatment with diet - no meds   Herpes genitalis in women    History of chlamydia    Hypertension    IBS (irritable bowel syndrome)    diet controlled - no meds, no current prob   Insomnia    Migraines    topamax    Mild obstructive sleep apnea 05/22/2023   MRSA (methicillin resistant staph aureus) culture positive    skin infection7/2013   PT STATES NO INFECTION AT PRESENT AND SHE IS FOLLOWING HER DOCTOR'S ORDERS TO USE MUPIROCIN   OINTMENT BID IN NOSE FIRST 5 DAYS OF EACH MONTH THRU DEC 2014 AND HAS HIBICLENS  TO [facility] THE AREAS WHERE THE INFECTION HAS BEEN.   PMS (premenstrual syndrome)    Pneumonia    Sarcoidosis    recent dx 11/2017- was hospitalized-tx w/ prednisone    Shortness of breath    with exertion     Thyromegaly    Vitamin D deficiency  Family History  Problem Relation Age of Onset   Anemia Mother    Hypertension Mother    Asthma Mother    Allergies Mother    Migraines Sister    Ulcers Sister    Migraines Sister    Allergies Sister    Migraines Maternal Aunt    Asthma Maternal Aunt    Migraines Maternal Aunt    Allergies Maternal Aunt    Breast cancer Paternal Aunt    Cancer Maternal Grandmother        colon   Lung cancer Cousin      Social History   Occupational History   Occupation: mortgage herbalist  Tobacco Use   Smoking status: Former    Current packs/day: 0.00    Average packs/day: 1  pack/day for 15.0 years (15.0 ttl pk-yrs)    Types: Cigarettes    Start date: 07/21/1985    Quit date: 07/21/2000    Years since quitting: 23.0   Smokeless tobacco: Never  Vaping Use   Vaping status: Never Used  Substance and Sexual Activity   Alcohol use: Yes    Alcohol/week: 0.0 standard drinks of alcohol    Comment: rare   Drug use: No   Sexual activity: Yes    Birth control/protection: Condom    Comment: IUD removed 02/10/14    Allergies  Allergen Reactions   Other Anaphylaxis    Spicy peppers - anything hot or spicy causes swelling and skin inflammation Cilantro- throat closes, trouble breathing   Pineapple Anaphylaxis    Throat closes, tongue swells   Latex Other (See Comments) and Rash    *Adhesive Tape  Discolors skin  Discolors skin    Adhesive [Tape] Other (See Comments)    Causes skin discoloration.   Erythromycin Nausea And Vomiting    Other reaction(s): vomiting, Can Take Zpak   Iron     Stomach pain. Pt can take Integra for iron     Outpatient Medications Prior to Visit  Medication Sig Dispense Refill   Ubrogepant  (UBRELVY ) 100 MG TABS Take 1 tablet (100 mg total) by mouth as needed. May repeat after 2 hours.  Maximum 2 tablets in 24 hours. 16 tablet 11   albuterol  (VENTOLIN  HFA) 108 (90 Base) MCG/ACT inhaler Inhale 2 puffs into the lungs every 6 (six) hours as needed for wheezing or shortness of breath. 8 g 6   amitriptyline  (ELAVIL ) 25 MG tablet Take 25 mg by mouth at bedtime.     amoxicillin -clavulanate (AUGMENTIN ) 875-125 MG tablet Take 1 tablet by mouth 2 (two) times daily. 14 tablet 0   azelastine (ASTELIN) 0.1 % nasal spray Place into both nostrils 2 (two) times daily. Use in each nostril as directed     Erenumab -aooe (AIMOVIG ) 140 MG/ML SOAJ Inject 140 mg into the skin every 28 (twenty-eight) days. 1.12 mL 11   fluconazole  (DIFLUCAN ) 150 MG tablet Take 150 mg by mouth as needed.     fluticasone  (FLONASE ) 50 MCG/ACT nasal spray Place 2 sprays into both  nostrils daily.     levocetirizine (XYZAL ) 5 MG tablet Take 1 tablet (5 mg total) by mouth every evening. 30 tablet 5   montelukast  (SINGULAIR ) 10 MG tablet Take 1 tablet (10 mg total) by mouth at bedtime. 90 tablet 1   ondansetron  (ZOFRAN ) 4 MG tablet Take 1 tablet (4 mg total) by mouth every 6 (six) hours. 20 tablet 0   ondansetron  (ZOFRAN -ODT) 4 MG disintegrating tablet Take 1 tablet (4 mg total) by mouth  every 8 (eight) hours as needed for nausea or vomiting. 20 tablet 5   promethazine  (PHENERGAN ) 25 MG tablet Take 25 mg by mouth every 6 (six) hours as needed for nausea or vomiting.     SUMAtriptan  (IMITREX ) 100 MG tablet Take 1 tablet (100 mg total) by mouth as needed for migraine. May repeat in 2 hours if headache persists or recurs.  Maximum 2 tablets in 24 hours. 30 tablet 2   tirzepatide  (ZEPBOUND ) 15 MG/0.5ML Pen Inject 15 mg into the skin once a week. 2 mL 11   tiZANidine (ZANAFLEX) 4 MG tablet Take 2 mg by mouth every 8 (eight) hours as needed for migraine.  5   valACYclovir (VALTREX) 500 MG tablet Take 500 mg by mouth as needed.     venlafaxine XR (EFFEXOR-XR) 150 MG 24 hr capsule Take 150 mg by mouth daily.     zolpidem  (AMBIEN ) 5 MG tablet Take 1 tablet (5 mg total) by mouth at bedtime as needed for sleep. Do not drive or operate heavy machinery after taking 30 tablet 5   No facility-administered medications prior to visit.    Review of Systems  Constitutional:  Negative for chills, diaphoresis, fever, malaise/fatigue and weight loss.  HENT:  Negative for congestion.   Respiratory:  Positive for cough. Negative for hemoptysis, sputum production, shortness of breath and wheezing.   Cardiovascular:  Negative for chest pain, palpitations and leg swelling.     Objective:  There were no vitals filed for this visit.    Physical Exam: General: Well-appearing, no acute distress, mildly hoarse voice HENT: [place], AT Eyes: EOMI, no scleral icterus Respiratory: No respiratory  distress Neuro: AAO x4, CNII-XII grossly intact Psych: Normal mood, normal affect  Data Reviewed:  Imaging: CXR 07/23/23 - No infiltrate effusion or edema  PFT: 01/19/18 FVC 2.33 (73%) FEV1 2.06 (80%) Ratio 88  TLC 80% DLCO 63% Interpretation: No obstructive or restrictive defect. Mildly reduced DLCO  Labs: CBC    Component Value Date/Time   WBC 6.4 10/03/2022 1108   RBC 5.02 10/03/2022 1108   HGB 13.7 10/03/2022 1108   HCT 41.3 10/03/2022 1108   PLT 334 10/03/2022 1108   MCV 82.3 10/03/2022 1108   MCH 27.3 10/03/2022 1108   MCHC 33.2 10/03/2022 1108   RDW 14.2 10/03/2022 1108   LYMPHSABS 2.9 11/18/2021 0929   MONOABS 0.5 11/18/2021 0929   EOSABS 0.1 11/18/2021 0929   BASOSABS 0.0 11/18/2021 0929   Absolute eos 11/18/21 - 100  Sleep: HST 04/19/23 - Mild OSA    Assessment & Plan:   Discussion: 53 year old female with OSA, asthma, hx sarcoid, migraines who presents for acute video visit. Subacute cough in setting of recent covid infection. Compliant with current asthma medications. Counseled on potential chronicity of cough and recommendations to symptom control including continuing current bronchodilators, short term prednisone , cough syrup, warm fluids, honey, voice rest and OTC support.    Asthma - not in exacerbation, persistent cough Recent covid infection --Prednisone  20 mg x 5 days --Tussionex  --Continue Symbicort  TWO puffs twice a day --Continue albuterol  AS NEEDED for shortness of breath or wheezing --Continue Singulair  10 mg daily  Hx sarcoid - doubt flare --Annual CXR. Due at next visit  OSA --Address at next visit  Health Maintenance Immunization History  Administered Date(s) Administered   Influenza,inj,Quad PF,6+ Mos 04/06/2018, 03/10/2019, 05/21/2020   Influenza,inj,quad, With Preservative 04/20/2017   Influenza-Unspecified 05/21/2020   PFIZER(Purple Top)SARS-COV-2 Vaccination 10/09/2019, 10/27/2019, 05/21/2020   Pneumococcal  Conjugate-13  04/06/2018   CT Lung Screen - not qualified  No orders of the defined types were placed in this encounter.  Meds ordered this encounter  Medications   chlorpheniramine-HYDROcodone  (TUSSIONEX) 10-8 MG/5ML    Sig: Take 5 mLs by mouth at bedtime as needed for cough.    Dispense:  115 mL    Refill:  0   predniSONE  (DELTASONE ) 20 MG tablet    Sig: Take 1 tablet (20 mg total) by mouth daily with breakfast for 5 days.    Dispense:  5 tablet    Refill:  0    Return for Already schedule for follow-up on 09/10/23..  I have spent a total time of 30-minutes on the day of the appointment reviewing prior documentation, coordinating care and discussing medical diagnosis and plan with the patient/family. Imaging, labs and tests included in this note have been reviewed and interpreted independently by me.  Dinora Hemm Slater Staff, MD [facility] 07/31/2023 3:57 PM

## 2023-07-31 NOTE — Telephone Encounter (Signed)
 Appt today at 330-video visit.

## 2023-09-10 ENCOUNTER — Telehealth (HOSPITAL_BASED_OUTPATIENT_CLINIC_OR_DEPARTMENT_OTHER): Payer: 59 | Admitting: Pulmonary Disease

## 2023-09-10 ENCOUNTER — Other Ambulatory Visit (HOSPITAL_COMMUNITY): Payer: Self-pay

## 2023-09-10 ENCOUNTER — Encounter (HOSPITAL_BASED_OUTPATIENT_CLINIC_OR_DEPARTMENT_OTHER): Payer: Self-pay | Admitting: Pulmonary Disease

## 2023-09-10 ENCOUNTER — Ambulatory Visit (HOSPITAL_BASED_OUTPATIENT_CLINIC_OR_DEPARTMENT_OTHER): Payer: 59 | Admitting: Pulmonary Disease

## 2023-09-10 DIAGNOSIS — J4530 Mild persistent asthma, uncomplicated: Secondary | ICD-10-CM | POA: Diagnosis not present

## 2023-09-10 MED ORDER — ALBUTEROL SULFATE HFA 108 (90 BASE) MCG/ACT IN AERS: 2.0000 | INHALATION_SPRAY | Freq: Four times a day (QID) | RESPIRATORY_TRACT | 6 refills | Status: AC | PRN

## 2023-09-10 MED ORDER — MONTELUKAST SODIUM 10 MG PO TABS: 10.0000 mg | ORAL_TABLET | Freq: Every day | ORAL | 3 refills | Status: AC

## 2023-09-10 MED ORDER — BUDESONIDE-FORMOTEROL FUMARATE 160-4.5 MCG/ACT IN AERO
2.0000 | INHALATION_SPRAY | Freq: Two times a day (BID) | RESPIRATORY_TRACT | 11 refills | Status: AC
Start: 2023-09-10 — End: ?

## 2023-09-10 NOTE — Progress Notes (Signed)
Virtual Visit via Video Note  I connected with Yvonne Banks on 09/10/23 at  2:00 PM EST by a video enabled telemedicine application and verified that I am speaking with the correct person using two identifiers.  Location: Patient: Home Provider: [facility]   I discussed the limitations of evaluation and management by telemedicine and the availability of in person appointments. The patient expressed understanding and agreed to proceed.   I discussed the assessment and treatment plan with the patient. The patient was provided an opportunity to ask questions and all were answered. The patient agreed with the plan and demonstrated an understanding of the instructions.   The patient was advised to call back or seek an in-person evaluation if the symptoms worsen or if the condition fails to improve as anticipated.  I provided 30 minutes of non-face-to-face time during this encounter.   Jameil Whitmoyer Mechele Collin, MD    Subjective:   PATIENT ID: Yvonne Banks GENDER: female DOB: [birth year], MRN: [id number]  Chief Complaint  Patient presents with   Follow-up    Flu like symptoms. Recent exposure flu contacts   Reason for Visit: Follow-up  Ms. Aalaiyah Banks is a 53 year old female with OSA, asthma, hx sarcoid, migraines who presents for follow-up visit. Former Dr. Craige Cotta patient  07/31/23 Recent prescribed Augment for sinus infection and molnupiravir for covid infection last month. Has completed medications at the end of month. Wheezing has resolved. She complains of persistent cough that seems to have worsened triggered by speech, cold. Interrupting her sleep at night. Compliant with Symbicort. Tried OTC cough syrup. Tessalon perls are not effective. She does require to talk at work and this has worsened her hoarseness and cough.  09/10/23 Started having flu like symptoms with malaise muscle aches and headache. Recent exposure flu contacts. She started tamiflu two days ago and  staying hydrated. Has had some wheezing and chest rattle but not significant.  Past Medical History:  Diagnosis Date   Allergy    Anemia menometrorrhaghia   history   Anginal pain (HCC)    diagnosed in childhood, continues oocasionally duing adulthood   Anxiety    no meds   Arthritis    lower back and knees   Bilateral knee pain    no current prob - only bothers her when it rains   Complication of anesthesia    woke up coughing after surgery May 2014 - required inhaler in recovery and for a week after that surgery--pt thinks it was Symbicort   Depression    Fibroids    Gallstones    causing nausea and and midchest -abdominal pain   GERD (gastroesophageal reflux disease)    History - resolved treatment with diet - no meds   Herpes genitalis in women    History of chlamydia    Hypertension    IBS (irritable bowel syndrome)    diet controlled - no meds, no current prob   Insomnia    Migraines    topamax   Mild obstructive sleep apnea 05/22/2023   MRSA (methicillin resistant staph aureus) culture positive    skin infection7/2013   PT STATES NO INFECTION AT PRESENT AND SHE IS FOLLOWING HER DOCTOR'S ORDERS TO USE MUPIROCIN  OINTMENT BID IN NOSE FIRST 5 DAYS OF EACH MONTH THRU DEC 2014 AND HAS HIBICLENS TO WASH THE AREAS WHERE THE INFECTION HAS BEEN.   PMS (premenstrual syndrome)    Pneumonia    Sarcoidosis    recent dx 11/2017-  was hospitalized-tx w/ prednisone   Shortness of breath    with exertion     Thyromegaly    Vitamin D deficiency      Family History  Problem Relation Age of Onset   Anemia Mother    Hypertension Mother    Asthma Mother    Allergies Mother    Migraines Sister    Ulcers Sister    Migraines Sister    Allergies Sister    Migraines Maternal Aunt    Asthma Maternal Aunt    Migraines Maternal Aunt    Allergies Maternal Aunt    Breast cancer Paternal Aunt    Cancer Maternal Grandmother        colon   Lung cancer Cousin      Social History    Occupational History   Occupation: mortgage Herbalist  Tobacco Use   Smoking status: Former    Current packs/day: 0.00    Average packs/day: 1 pack/day for 15.0 years (15.0 ttl pk-yrs)    Types: Cigarettes    Start date: 07/21/1985    Quit date: 07/21/2000    Years since quitting: 23.1   Smokeless tobacco: Never  Vaping Use   Vaping status: Never Used  Substance and Sexual Activity   Alcohol use: Yes    Alcohol/week: 0.0 standard drinks of alcohol    Comment: rare   Drug use: No   Sexual activity: Yes    Birth control/protection: Condom    Comment: IUD removed 02/10/14    Allergies  Allergen Reactions   Other Anaphylaxis    Spicy peppers - anything hot or spicy causes swelling and skin inflammation Cilantro- throat closes, trouble breathing   Pineapple Anaphylaxis    Throat closes, tongue swells   Latex Other (See Comments) and Rash    *Adhesive Tape  Discolors skin  Discolors skin    Adhesive [Tape] Other (See Comments)    Causes skin discoloration.   Erythromycin Nausea And Vomiting    Other reaction(s): vomiting, Can Take Zpak   Iron     Stomach pain. Pt can take Integra for iron     Outpatient Medications Prior to Visit  Medication Sig Dispense Refill   amitriptyline (ELAVIL) 25 MG tablet Take 25 mg by mouth at bedtime.     azelastine (ASTELIN) 0.1 % nasal spray Place into both nostrils 2 (two) times daily. Use in each nostril as directed     Erenumab-aooe (AIMOVIG) 140 MG/ML SOAJ Inject 140 mg into the skin every 28 (twenty-eight) days. 1.12 mL 11   fluticasone (FLONASE) 50 MCG/ACT nasal spray Place 2 sprays into both nostrils daily.     levocetirizine (XYZAL) 5 MG tablet Take 1 tablet (5 mg total) by mouth every evening. 30 tablet 5   ondansetron (ZOFRAN-ODT) 4 MG disintegrating tablet Take 1 tablet (4 mg total) by mouth every 8 (eight) hours as needed for nausea or vomiting. 20 tablet 5   oseltamivir (TAMIFLU) 75 MG capsule Take 75 mg by mouth 2  (two) times daily.     promethazine (PHENERGAN) 25 MG tablet Take 25 mg by mouth every 6 (six) hours as needed for nausea or vomiting.     SUMAtriptan (IMITREX) 100 MG tablet Take 1 tablet (100 mg total) by mouth as needed for migraine. May repeat in 2 hours if headache persists or recurs.  Maximum 2 tablets in 24 hours. 30 tablet 2   tirzepatide (ZEPBOUND) 15 MG/0.5ML Pen Inject 15 mg into the skin once a week.  2 mL 11   tiZANidine (ZANAFLEX) 4 MG tablet Take 2 mg by mouth every 8 (eight) hours as needed for migraine.  5   Ubrogepant (UBRELVY) 100 MG TABS Take 1 tablet (100 mg total) by mouth as needed. May repeat after 2 hours.  Maximum 2 tablets in 24 hours. 16 tablet 11   valACYclovir (VALTREX) 500 MG tablet Take 500 mg by mouth as needed.     venlafaxine XR (EFFEXOR-XR) 150 MG 24 hr capsule Take 150 mg by mouth daily.     zolpidem (AMBIEN) 5 MG tablet Take 1 tablet (5 mg total) by mouth at bedtime as needed for sleep. Do not drive or operate heavy machinery after taking 30 tablet 5   albuterol (VENTOLIN HFA) 108 (90 Base) MCG/ACT inhaler Inhale 2 puffs into the lungs every 6 (six) hours as needed for wheezing or shortness of breath. 8 g 6   montelukast (SINGULAIR) 10 MG tablet Take 1 tablet (10 mg total) by mouth at bedtime. 90 tablet 1   fluconazole (DIFLUCAN) 150 MG tablet Take 150 mg by mouth as needed. (Patient not taking: Reported on 09/10/2023)     ondansetron (ZOFRAN) 4 MG tablet Take 1 tablet (4 mg total) by mouth every 6 (six) hours. (Patient not taking: Reported on 09/10/2023) 20 tablet 0   amoxicillin-clavulanate (AUGMENTIN) 875-125 MG tablet Take 1 tablet by mouth 2 (two) times daily. (Patient not taking: Reported on 09/10/2023) 14 tablet 0   chlorpheniramine-HYDROcodone (TUSSIONEX) 10-8 MG/5ML Take 5 mLs by mouth at bedtime as needed for cough. (Patient not taking: Reported on 09/10/2023) 115 mL 0   No facility-administered medications prior to visit.    Review of Systems   Constitutional:  Negative for chills, diaphoresis, fever, malaise/fatigue and weight loss.  HENT:  Positive for congestion.   Respiratory:  Positive for cough. Negative for hemoptysis, sputum production, shortness of breath and wheezing.   Cardiovascular:  Negative for chest pain, palpitations and leg swelling.     Objective:  There were no vitals filed for this visit.    Physical Exam: General: Well-appearing, no acute distress HENT: , AT Eyes: EOMI, no scleral icterus Respiratory: No respiratory distress Neuro: AAO x4, CNII-XII grossly intact Psych: Normal mood, normal affect  Data Reviewed:  Imaging: CXR 07/23/23 - No infiltrate effusion or edema  PFT: 01/19/18 FVC 2.33 (73%) FEV1 2.06 (80%) Ratio 88  TLC 80% DLCO 63% Interpretation: No obstructive or restrictive defect. Mildly reduced DLCO  Labs: CBC    Component Value Date/Time   WBC 6.4 10/03/2022 1108   RBC 5.02 10/03/2022 1108   HGB 13.7 10/03/2022 1108   HCT 41.3 10/03/2022 1108   PLT 334 10/03/2022 1108   MCV 82.3 10/03/2022 1108   MCH 27.3 10/03/2022 1108   MCHC 33.2 10/03/2022 1108   RDW 14.2 10/03/2022 1108   LYMPHSABS 2.9 11/18/2021 0929   MONOABS 0.5 11/18/2021 0929   EOSABS 0.1 11/18/2021 0929   BASOSABS 0.0 11/18/2021 0929   Absolute eos 11/18/21 - 100  Sleep: HST 04/19/23 - Mild OSA    Assessment & Plan:   Discussion: 53 year old female with OSA, asthma, hx sarcoid, migraines who presents for follow-up video visit. Last visit with covid which symptoms resolved however now with influenza and currently on tamiflu. Symptoms present but resolving. Not in asthma exacerbation. We investigated inhaler options and reviewed bronchodilator treatment.   Asthma - not in exacerbation Covid infection - resolved Active flu - recommend supportive care with rest, hydration  and OTC --Declined prednisone --CONTINUE generic Symbicort TWO puffs TWICE a day. REFILLED --CONTINUE Albuterol AS NEEDED for shortness  of breath or wheezing. REFILLED --Continue Singulair 10 mg daily. REFILLED  Hx sarcoid - doubt flare --Annual CXR. Due at next visit  OSA --Address at next visit  Health Maintenance Immunization History  Administered Date(s) Administered   Influenza,inj,Quad PF,6+ Mos 04/06/2018, 03/10/2019, 05/21/2020   Influenza,inj,quad, With Preservative 04/20/2017   Influenza-Unspecified 05/21/2020   PFIZER(Purple Top)SARS-COV-2 Vaccination 10/09/2019, 10/27/2019, 05/21/2020   Pneumococcal Conjugate-13 04/06/2018   CT Lung Screen - not qualified  No orders of the defined types were placed in this encounter.  Meds ordered this encounter  Medications   montelukast (SINGULAIR) 10 MG tablet    Sig: Take 1 tablet (10 mg total) by mouth at bedtime.    Dispense:  90 tablet    Refill:  3   budesonide-formoterol (SYMBICORT) 160-4.5 MCG/ACT inhaler    Sig: Inhale 2 puffs into the lungs 2 (two) times daily.    Dispense:  1 each    Refill:  11    GENERIC ONLY   albuterol (VENTOLIN HFA) 108 (90 Base) MCG/ACT inhaler    Sig: Inhale 2 puffs into the lungs every 6 (six) hours as needed for wheezing or shortness of breath.    Dispense:  8 g    Refill:  6    Return in about 6 months (around 03/09/2024).  I have spent a total time of 31-minutes on the day of the appointment including chart review, data review, collecting history, coordinating care and discussing medical diagnosis and plan with the patient/family. Past medical history, allergies, medications were reviewed. Pertinent imaging, labs and tests included in this note have been reviewed and interpreted independently by me.  Kanoelani Dobies Mechele Collin, MD [facility] 09/10/2023 2:34 PM

## 2023-09-10 NOTE — Patient Instructions (Signed)
Asthma - not in exacerbation Covid infection - resolved Active flu --Declined prednisone --CONTINUE generic Symbicort TWO puffs TWICE a day. REFILLED --CONTINUE Albuterol AS NEEDED for shortness of breath or wheezing. REFILLED --Continue Singulair 10 mg daily. REFILLED  Hx sarcoid - doubt flare --Annual CXR. Due at next visit

## 2023-09-21 ENCOUNTER — Other Ambulatory Visit: Payer: Self-pay | Admitting: Neurology

## 2023-09-22 ENCOUNTER — Encounter (HOSPITAL_COMMUNITY): Payer: Self-pay

## 2023-09-23 ENCOUNTER — Other Ambulatory Visit (HOSPITAL_COMMUNITY): Payer: Self-pay

## 2023-10-12 NOTE — Progress Notes (Deleted)
 NEUROLOGY FOLLOW UP OFFICE NOTE  KELLIANNE EK [id number]  Assessment/Plan:   Migraine without aura, with status migrainosus, intractable - improved frequency but still intractable Right occipital neuralgia, resolved at this time.   Migraine prevention:  Aimovig 140mg .  Venlafaxine XR 150mg  daily for anxiety (prescribed by another provider) *** Migraine rescue:  Ubrelvy 100mg .  Zofran ODT 4mg  for nausea *** Limit use of pain relievers to no more than 2 days out of week to prevent risk of rebound or medication-overuse headache. Keep headache diary Follow up 6 months.  Subjective:  TENLEY WINWARD is a 53 year old female with HTN, IBS, depression and anxiety who follows up for migraines and vertigo.  UPDATE: Duration:  2-3 days Frequency:  every other weekend (total 7-8 days a month)  Recently,   PCP increased her venlafaxine on 9/1 for 150mg  daily to treat her anxiety.  Current NSAIDS/analgesics:  ibuprofen Current triptans:  sumatriptan 100mg  Current ergotamine:  none Current anti-emetic:  Zofran-ODT 4mg , promethazine 25mg  Current muscle relaxants:  none Current Antihypertensive medications:  furosemide 20mg  daily Current Antidepressant medications:  amitriptyline 25mg  QHS, venlafaxine XR 150mg  daily Current Anticonvulsant medications:  none Current anti-CGRP:  Aimovig 140mg  Current Vitamins/Herbal/Supplements:  MVI Current Antihistamines/Decongestants:  Flonase, Xyzal Other therapy:  none Birth control:  none Other medications:  Zepbound, Valium 2mg  Q6h PRN  HISTORY:  She had been feeling lightheaded beginning in late 2023.  On the morning of 10/03/2022, she woke up that morning and started feeling dizzy once she stood up.  Describes a spinning sensation.  She was seen in the ED where they thought it was related to dehydration and recent increase in Zepbound.  Dizziness persisted.  A spinning sensation triggered by movement or change in position, usually to the  right, and lasting a minute.  For quite some time, she reports right sided ear pain and aural fullness.     At the same time, she began experiencing daily migraines.  She has a history of migraines that occur infrequently.  She started experiencing a pressure across the forehead, between her eyes or in the temples.  Sometimes notes a zapping pain around her head.  Associated with nausea, photophobia, phonophobia and occasional blurred vision. Lasts 12 to 24 hours with rizatriptan.  Has been occurring daily.-   MRI of brain with and without contrast on 10/15/2022 personally reviewed and revealed somewhat tortuous optic nerves but otherwise unremarkable.    She was prescribed prednisone and amoxicillin for possible sinusitis.  Due to results of MRI suggesting possible increased intracranial pressure, she was started on furosemide by her PCP.  She thinks it has helped.  Notes some blurred vision over the past year.  Denies visual obscurations or pulsatile tinnitus.  For vertigo, she has been performing the Epley maneuver at home.  She saw optometrist, Dr. Hanley Ben at [facility], on 10/27/2022 whose exam revealed no evidence of active or prior papilledema.    Later that year, she began experiencing shooting electric pain from the right occipital region to the temple.  This went on for 3 days.  Scalp may feel sore but no paresthesias.  Feels some neck pain with migraines but otherwise no neck pain   Past NSAIDS/analgesics:  naproxen, tramadol Past abortive triptans:  rizatriptan Past abortive ergotamine:  none Past muscle relaxants:  none Past anti-emetic:  none Past antihypertensive medications:  none Past antidepressant medications:  sertraline, paroxetine Past anticonvulsant medications:  topiramate Past anti-CGRP:  none Past vitamins/Herbal/Supplements:  none Past antihistamines/decongestants:  meclizine Other past therapies:  trigger point injections, nerve blocks     PAST MEDICAL  HISTORY: Past Medical History:  Diagnosis Date   Allergy    Anemia menometrorrhaghia   history   Anginal pain (HCC)    diagnosed in childhood, continues oocasionally duing adulthood   Anxiety    no meds   Arthritis    lower back and knees   Bilateral knee pain    no current prob - only bothers her when it rains   Complication of anesthesia    woke up coughing after surgery May 2014 - required inhaler in recovery and for a week after that surgery--pt thinks it was Symbicort   Depression    Fibroids    Gallstones    causing nausea and and midchest -abdominal pain   GERD (gastroesophageal reflux disease)    History - resolved treatment with diet - no meds   Herpes genitalis in women    History of chlamydia    Hypertension    IBS (irritable bowel syndrome)    diet controlled - no meds, no current prob   Insomnia    Migraines    topamax   Mild obstructive sleep apnea 05/22/2023   MRSA (methicillin resistant staph aureus) culture positive    skin infection7/2013   PT STATES NO INFECTION AT PRESENT AND SHE IS FOLLOWING HER DOCTOR'S ORDERS TO USE MUPIROCIN  OINTMENT BID IN NOSE FIRST 5 DAYS OF EACH MONTH THRU DEC 2014 AND HAS HIBICLENS TO WASH THE AREAS WHERE THE INFECTION HAS BEEN.   PMS (premenstrual syndrome)    Pneumonia    Sarcoidosis    recent dx 11/2017- was hospitalized-tx w/ prednisone   Shortness of breath    with exertion     Thyromegaly    Vitamin D deficiency     MEDICATIONS: Current Outpatient Medications on File Prior to Visit  Medication Sig Dispense Refill   albuterol (VENTOLIN HFA) 108 (90 Base) MCG/ACT inhaler Inhale 2 puffs into the lungs every 6 (six) hours as needed for wheezing or shortness of breath. 8 g 6   amitriptyline (ELAVIL) 25 MG tablet Take 25 mg by mouth at bedtime.     azelastine (ASTELIN) 0.1 % nasal spray Place into both nostrils 2 (two) times daily. Use in each nostril as directed     budesonide-formoterol (SYMBICORT) 160-4.5 MCG/ACT inhaler  Inhale 2 puffs into the lungs 2 (two) times daily. 1 each 11   Erenumab-aooe (AIMOVIG) 140 MG/ML SOAJ Inject 140 mg into the skin every 28 (twenty-eight) days. 1.12 mL 11   fluconazole (DIFLUCAN) 150 MG tablet Take 150 mg by mouth as needed. (Patient not taking: Reported on 09/10/2023)     fluticasone (FLONASE) 50 MCG/ACT nasal spray Place 2 sprays into both nostrils daily.     levocetirizine (XYZAL) 5 MG tablet Take 1 tablet (5 mg total) by mouth every evening. 30 tablet 5   montelukast (SINGULAIR) 10 MG tablet Take 1 tablet (10 mg total) by mouth at bedtime. 90 tablet 3   ondansetron (ZOFRAN) 4 MG tablet Take 1 tablet (4 mg total) by mouth every 6 (six) hours. (Patient not taking: Reported on 09/10/2023) 20 tablet 0   ondansetron (ZOFRAN-ODT) 4 MG disintegrating tablet Take 1 tablet (4 mg total) by mouth every 8 (eight) hours as needed for nausea or vomiting. 20 tablet 5   oseltamivir (TAMIFLU) 75 MG capsule Take 75 mg by mouth 2 (two) times daily.  promethazine (PHENERGAN) 25 MG tablet Take 25 mg by mouth every 6 (six) hours as needed for nausea or vomiting.     SUMAtriptan (IMITREX) 100 MG tablet Take 1 tablet (100 mg total) by mouth as needed for migraine. May repeat in 2 hours if headache persists or recurs.  Maximum 2 tablets in 24 hours. 30 tablet 2   tirzepatide (ZEPBOUND) 15 MG/0.5ML Pen Inject 15 mg into the skin once a week. 2 mL 11   tiZANidine (ZANAFLEX) 4 MG tablet Take 2 mg by mouth every 8 (eight) hours as needed for migraine.  5   Ubrogepant (UBRELVY) 100 MG TABS Take 1 tablet (100 mg total) by mouth as needed. May repeat after 2 hours.  Maximum 2 tablets in 24 hours. 16 tablet 11   valACYclovir (VALTREX) 500 MG tablet Take 500 mg by mouth as needed.     venlafaxine XR (EFFEXOR-XR) 150 MG 24 hr capsule Take 150 mg by mouth daily.     zolpidem (AMBIEN) 5 MG tablet Take 1 tablet (5 mg total) by mouth at bedtime as needed for sleep. Do not drive or operate heavy machinery after taking  30 tablet 5   No current facility-administered medications on file prior to visit.    ALLERGIES: Allergies  Allergen Reactions   Other Anaphylaxis    Spicy peppers - anything hot or spicy causes swelling and skin inflammation Cilantro- throat closes, trouble breathing   Pineapple Anaphylaxis    Throat closes, tongue swells   Latex Other (See Comments) and Rash    *Adhesive Tape  Discolors skin  Discolors skin    Adhesive [Tape] Other (See Comments)    Causes skin discoloration.   Erythromycin Nausea And Vomiting    Other reaction(s): vomiting, Can Take Zpak   Iron     Stomach pain. Pt can take Integra for iron    FAMILY HISTORY: Family History  Problem Relation Age of Onset   Anemia Mother    Hypertension Mother    Asthma Mother    Allergies Mother    Migraines Sister    Ulcers Sister    Migraines Sister    Allergies Sister    Migraines Maternal Aunt    Asthma Maternal Aunt    Migraines Maternal Aunt    Allergies Maternal Aunt    Breast cancer Paternal Aunt    Cancer Maternal Grandmother        colon   Lung cancer Cousin       Objective:  *** General: No acute distress.  Patient appears well-groomed.   Head:  Normocephalic/atraumatic Neck:  Supple.  No paraspinal tenderness.  Full range of motion. Heart:  Regular rate and rhythm. Neuro:  Alert and oriented.  Speech fluent and not dysarthric.  Language intact.  CN II-XII intact.  Bulk and tone normal.  Muscle strength 5/5 throughout.  Deep tendon reflexes 2+ throughout.  Gait normal.  Romberg negative.    Shon Millet, DO  CC: Mila Palmer, MD

## 2023-10-13 ENCOUNTER — Ambulatory Visit: Payer: 59 | Admitting: Neurology

## 2023-10-13 ENCOUNTER — Encounter: Payer: Self-pay | Admitting: Neurology

## 2023-10-20 NOTE — Progress Notes (Unsigned)
 Virtual Visit via Video Note  Consent was obtained for video visit:  Yes.   Answered questions that patient had about telehealth interaction:  Yes.   I discussed the limitations, risks, security and privacy concerns of performing an evaluation and management service by telemedicine. I also discussed with the patient that there may be a patient responsible charge related to this service. The patient expressed understanding and agreed to proceed.  Pt location: Home Physician Location: office Name of referring provider:  Mila Palmer, MD I connected with Yvonne Banks at patients initiation/request on 10/21/2023 at 10:50 AM EDT by video enabled telemedicine application and verified that I am speaking with the correct person using two identifiers. Pt MRN:  [id number] Pt DOB:  1970-12-09 Video Participants:  Yvonne Banks  Assessment/Plan:   Migraine without aura, with status migrainosus, intractable - improved frequency but still intractable Right occipital neuralgia, resolved at this time.   Migraine prevention:  Aimovig 140mg .  Venlafaxine XR 150mg  daily for anxiety (prescribed by another provider) *** Migraine rescue:  Ubrelvy 100mg .  Zofran ODT 4mg  for nausea *** Limit use of pain relievers to no more than 2 days out of week to prevent risk of rebound or medication-overuse headache. Keep headache diary Follow up 6 months.  Subjective:  Yvonne Banks is a 53 year old female with HTN, IBS, depression and anxiety who follows up for migraines and vertigo.  UPDATE: Duration:  2-3 days Frequency:  every other weekend (total 7-8 days a month)  Recently,   PCP increased her venlafaxine on 9/1 for 150mg  daily to treat her anxiety.  Current NSAIDS/analgesics:  ibuprofen Current triptans:  sumatriptan 100mg  Current ergotamine:  none Current anti-emetic:  Zofran-ODT 4mg , promethazine 25mg  Current muscle relaxants:  none Current Antihypertensive medications:  furosemide  20mg  daily Current Antidepressant medications:  amitriptyline 25mg  QHS, venlafaxine XR 150mg  daily Current Anticonvulsant medications:  none Current anti-CGRP:  Aimovig 140mg  Current Vitamins/Herbal/Supplements:  MVI Current Antihistamines/Decongestants:  Flonase, Xyzal Other therapy:  none Birth control:  none Other medications:  Zepbound, Valium 2mg  Q6h PRN  HISTORY:  She had been feeling lightheaded beginning in late 2023.  On the morning of 10/03/2022, she woke up that morning and started feeling dizzy once she stood up.  Describes a spinning sensation.  She was seen in the ED where they thought it was related to dehydration and recent increase in Zepbound.  Dizziness persisted.  A spinning sensation triggered by movement or change in position, usually to the right, and lasting a minute.  For quite some time, she reports right sided ear pain and aural fullness.     At the same time, she began experiencing daily migraines.  She has a history of migraines that occur infrequently.  She started experiencing a pressure across the forehead, between her eyes or in the temples.  Sometimes notes a zapping pain around her head.  Associated with nausea, photophobia, phonophobia and occasional blurred vision. Lasts 12 to 24 hours with rizatriptan.  Has been occurring daily.-   MRI of brain with and without contrast on 10/15/2022 personally reviewed and revealed somewhat tortuous optic nerves but otherwise unremarkable.    She was prescribed prednisone and amoxicillin for possible sinusitis.  Due to results of MRI suggesting possible increased intracranial pressure, she was started on furosemide by her PCP.  She thinks it has helped.  Notes some blurred vision over the past year.  Denies visual obscurations or pulsatile tinnitus.  For vertigo, she has  been performing the Epley maneuver at home.  She saw optometrist, Dr. Hanley Ben at [facility], on 10/27/2022 whose exam revealed no evidence of active  or prior papilledema.    Later that year, she began experiencing shooting electric pain from the right occipital region to the temple.  This went on for 3 days.  Scalp may feel sore but no paresthesias.  Feels some neck pain with migraines but otherwise no neck pain   Past NSAIDS/analgesics:  naproxen, tramadol Past abortive triptans:  rizatriptan Past abortive ergotamine:  none Past muscle relaxants:  none Past anti-emetic:  none Past antihypertensive medications:  none Past antidepressant medications:  sertraline, paroxetine Past anticonvulsant medications:  topiramate Past anti-CGRP:  none Past vitamins/Herbal/Supplements:  none Past antihistamines/decongestants:  meclizine Other past therapies:  trigger point injections, nerve blocks  Past Medical History: Past Medical History:  Diagnosis Date   Allergy    Anemia menometrorrhaghia   history   Anginal pain (HCC)    diagnosed in childhood, continues oocasionally duing adulthood   Anxiety    no meds   Arthritis    lower back and knees   Bilateral knee pain    no current prob - only bothers her when it rains   Complication of anesthesia    woke up coughing after surgery May 2014 - required inhaler in recovery and for a week after that surgery--pt thinks it was Symbicort   Depression    Fibroids    Gallstones    causing nausea and and midchest -abdominal pain   GERD (gastroesophageal reflux disease)    History - resolved treatment with diet - no meds   Herpes genitalis in women    History of chlamydia    Hypertension    IBS (irritable bowel syndrome)    diet controlled - no meds, no current prob   Insomnia    Migraines    topamax   Mild obstructive sleep apnea 05/22/2023   MRSA (methicillin resistant staph aureus) culture positive    skin infection7/2013   PT STATES NO INFECTION AT PRESENT AND SHE IS FOLLOWING HER DOCTOR'S ORDERS TO USE MUPIROCIN  OINTMENT BID IN NOSE FIRST 5 DAYS OF EACH MONTH THRU DEC 2014 AND HAS  HIBICLENS TO WASH THE AREAS WHERE THE INFECTION HAS BEEN.   PMS (premenstrual syndrome)    Pneumonia    Sarcoidosis    recent dx 11/2017- was hospitalized-tx w/ prednisone   Shortness of breath    with exertion     Thyromegaly    Vitamin D deficiency     Medications: Outpatient Encounter Medications as of 10/21/2023  Medication Sig   albuterol (VENTOLIN HFA) 108 (90 Base) MCG/ACT inhaler Inhale 2 puffs into the lungs every 6 (six) hours as needed for wheezing or shortness of breath.   amitriptyline (ELAVIL) 25 MG tablet Take 25 mg by mouth at bedtime.   azelastine (ASTELIN) 0.1 % nasal spray Place into both nostrils 2 (two) times daily. Use in each nostril as directed   budesonide-formoterol (SYMBICORT) 160-4.5 MCG/ACT inhaler Inhale 2 puffs into the lungs 2 (two) times daily.   Erenumab-aooe (AIMOVIG) 140 MG/ML SOAJ Inject 140 mg into the skin every 28 (twenty-eight) days.   fluconazole (DIFLUCAN) 150 MG tablet Take 150 mg by mouth as needed. (Patient not taking: Reported on 09/10/2023)   fluticasone (FLONASE) 50 MCG/ACT nasal spray Place 2 sprays into both nostrils daily.   levocetirizine (XYZAL) 5 MG tablet Take 1 tablet (5 mg total) by mouth every  evening.   montelukast (SINGULAIR) 10 MG tablet Take 1 tablet (10 mg total) by mouth at bedtime.   ondansetron (ZOFRAN) 4 MG tablet Take 1 tablet (4 mg total) by mouth every 6 (six) hours. (Patient not taking: Reported on 09/10/2023)   ondansetron (ZOFRAN-ODT) 4 MG disintegrating tablet Take 1 tablet (4 mg total) by mouth every 8 (eight) hours as needed for nausea or vomiting.   oseltamivir (TAMIFLU) 75 MG capsule Take 75 mg by mouth 2 (two) times daily.   promethazine (PHENERGAN) 25 MG tablet Take 25 mg by mouth every 6 (six) hours as needed for nausea or vomiting.   SUMAtriptan (IMITREX) 100 MG tablet Take 1 tablet (100 mg total) by mouth as needed for migraine. May repeat in 2 hours if headache persists or recurs.  Maximum 2 tablets in 24  hours.   tirzepatide (ZEPBOUND) 15 MG/0.5ML Pen Inject 15 mg into the skin once a week.   tiZANidine (ZANAFLEX) 4 MG tablet Take 2 mg by mouth every 8 (eight) hours as needed for migraine.   Ubrogepant (UBRELVY) 100 MG TABS Take 1 tablet (100 mg total) by mouth as needed. May repeat after 2 hours.  Maximum 2 tablets in 24 hours.   valACYclovir (VALTREX) 500 MG tablet Take 500 mg by mouth as needed.   venlafaxine XR (EFFEXOR-XR) 150 MG 24 hr capsule Take 150 mg by mouth daily.   zolpidem (AMBIEN) 5 MG tablet Take 1 tablet (5 mg total) by mouth at bedtime as needed for sleep. Do not drive or operate heavy machinery after taking   No facility-administered encounter medications on file as of 10/21/2023.    Allergies: Allergies  Allergen Reactions   Other Anaphylaxis    Spicy peppers - anything hot or spicy causes swelling and skin inflammation Cilantro- throat closes, trouble breathing   Pineapple Anaphylaxis    Throat closes, tongue swells   Latex Other (See Comments) and Rash    *Adhesive Tape  Discolors skin  Discolors skin    Adhesive [Tape] Other (See Comments)    Causes skin discoloration.   Erythromycin Nausea And Vomiting    Other reaction(s): vomiting, Can Take Zpak   Iron     Stomach pain. Pt can take Integra for iron    Family History: Family History  Problem Relation Age of Onset   Anemia Mother    Hypertension Mother    Asthma Mother    Allergies Mother    Migraines Sister    Ulcers Sister    Migraines Sister    Allergies Sister    Migraines Maternal Aunt    Asthma Maternal Aunt    Migraines Maternal Aunt    Allergies Maternal Aunt    Breast cancer Paternal Aunt    Cancer Maternal Grandmother        colon   Lung cancer Cousin     Observations/Objective:   No acute distress.  Alert and oriented.  Speech fluent and not dysarthric.     Follow Up Instructions:    -I discussed the assessment and treatment plan with the patient. The patient was provided an  opportunity to ask questions and all were answered. The patient agreed with the plan and demonstrated an understanding of the instructions.   The patient was advised to call back or seek an in-person evaluation if the symptoms worsen or if the condition fails to improve as anticipated.   Cira Servant, DO   CC: Mila Palmer, MD

## 2023-10-21 ENCOUNTER — Telehealth (INDEPENDENT_AMBULATORY_CARE_PROVIDER_SITE_OTHER): Admitting: Neurology

## 2023-10-21 ENCOUNTER — Encounter: Payer: Self-pay | Admitting: Neurology

## 2023-10-21 DIAGNOSIS — G43009 Migraine without aura, not intractable, without status migrainosus: Secondary | ICD-10-CM | POA: Diagnosis not present

## 2023-10-21 DIAGNOSIS — R42 Dizziness and giddiness: Secondary | ICD-10-CM

## 2023-10-21 DIAGNOSIS — H9319 Tinnitus, unspecified ear: Secondary | ICD-10-CM

## 2023-10-21 MED ORDER — AIMOVIG 140 MG/ML ~~LOC~~ SOAJ: 140.0000 mg | SUBCUTANEOUS | 11 refills | Status: DC

## 2023-10-23 ENCOUNTER — Encounter (INDEPENDENT_AMBULATORY_CARE_PROVIDER_SITE_OTHER): Payer: Self-pay | Admitting: Otolaryngology

## 2023-10-30 ENCOUNTER — Encounter (INDEPENDENT_AMBULATORY_CARE_PROVIDER_SITE_OTHER): Payer: Self-pay | Admitting: Otolaryngology

## 2023-11-03 ENCOUNTER — Telehealth: Payer: Self-pay | Admitting: Neurology

## 2023-11-03 ENCOUNTER — Other Ambulatory Visit: Payer: Self-pay | Admitting: Neurology

## 2023-11-03 ENCOUNTER — Other Ambulatory Visit (HOSPITAL_COMMUNITY): Payer: Self-pay

## 2023-11-03 ENCOUNTER — Telehealth: Payer: Self-pay | Admitting: Pharmacy Technician

## 2023-11-03 NOTE — Telephone Encounter (Signed)
 PA team please start a PA for Aimovig.

## 2023-11-03 NOTE — Telephone Encounter (Signed)
 PA has been submitted, and telephone encounter has been created. Please see telephone encounter dated 4.15.25.

## 2023-11-03 NOTE — Telephone Encounter (Signed)
 Pt said her pharmacy is saying she needs a PA on her Aimovig.

## 2023-11-03 NOTE — Telephone Encounter (Signed)
 Pharmacy Patient Advocate Encounter   Received notification from Pt Calls Messages that prior authorization for AIMOVIG 140MG  is required/requested.   Insurance verification completed.   The patient is insured through [employer] .   Per test claim: PA required; PA submitted to above mentioned insurance via CoverMyMeds Key/confirmation #/EOC [id number] Status is pending

## 2023-11-04 ENCOUNTER — Other Ambulatory Visit (HOSPITAL_COMMUNITY): Payer: Self-pay

## 2023-11-04 NOTE — Telephone Encounter (Signed)
 Pharmacy Patient Advocate Encounter  Received notification from [facility] that Prior Authorization for AIMOVIG 140MG  has been APPROVED from 3.16.25 to 4.15.26. Ran test claim, Copay is $75. This test claim was processed through [facility]- copay amounts may vary at other pharmacies due to pharmacy/plan contracts, or as the patient moves through the different stages of their insurance plan.   PA #/Case ID/Reference #: [id number]

## 2023-11-18 ENCOUNTER — Encounter (INDEPENDENT_AMBULATORY_CARE_PROVIDER_SITE_OTHER): Payer: Self-pay | Admitting: Otolaryngology

## 2023-12-07 ENCOUNTER — Ambulatory Visit
Admission: RE | Admit: 2023-12-07 | Discharge: 2023-12-07 | Disposition: A | Discharge status: Home / Self Care | Source: Ambulatory Visit | Attending: Nurse Practitioner | Admitting: Nurse Practitioner

## 2023-12-07 ENCOUNTER — Other Ambulatory Visit: Payer: Self-pay | Admitting: Nurse Practitioner

## 2023-12-07 DIAGNOSIS — K5900 Constipation, unspecified: Secondary | ICD-10-CM

## 2024-02-02 ENCOUNTER — Other Ambulatory Visit (HOSPITAL_COMMUNITY): Payer: Self-pay

## 2024-02-02 MED ORDER — ZEPBOUND 15 MG/0.5ML ~~LOC~~ SOAJ
15.0000 mg | SUBCUTANEOUS | 0 refills | Status: AC
  Filled 2024-02-02: qty 6, 84d supply, fill #0
  Filled 2024-02-14 – 2024-04-13 (×2): qty 2, 28d supply, fill #0

## 2024-02-15 ENCOUNTER — Other Ambulatory Visit (HOSPITAL_COMMUNITY): Payer: Self-pay

## 2024-02-16 ENCOUNTER — Other Ambulatory Visit (HOSPITAL_BASED_OUTPATIENT_CLINIC_OR_DEPARTMENT_OTHER): Payer: Self-pay

## 2024-02-16 ENCOUNTER — Telehealth (HOSPITAL_COMMUNITY): Payer: Self-pay

## 2024-02-16 ENCOUNTER — Other Ambulatory Visit (HOSPITAL_COMMUNITY): Payer: Self-pay

## 2024-02-17 ENCOUNTER — Other Ambulatory Visit (HOSPITAL_COMMUNITY): Payer: Self-pay

## 2024-02-18 ENCOUNTER — Other Ambulatory Visit (HOSPITAL_COMMUNITY): Payer: Self-pay

## 2024-02-26 ENCOUNTER — Other Ambulatory Visit (HOSPITAL_COMMUNITY): Payer: Self-pay

## 2024-02-26 MED ORDER — ZEPBOUND 15 MG/0.5ML ~~LOC~~ SOAJ
15.0000 mg | SUBCUTANEOUS | 0 refills | Status: DC
  Filled 2024-02-26: qty 6, 84d supply, fill #0
  Filled 2024-03-14: qty 2, 28d supply, fill #0
  Filled 2024-04-13 (×2): qty 2, 28d supply, fill #1
  Filled 2024-05-14: qty 2, 28d supply, fill #2

## 2024-03-13 ENCOUNTER — Other Ambulatory Visit (HOSPITAL_COMMUNITY): Payer: Self-pay

## 2024-03-14 ENCOUNTER — Other Ambulatory Visit: Payer: Self-pay

## 2024-03-14 ENCOUNTER — Other Ambulatory Visit (HOSPITAL_COMMUNITY): Payer: Self-pay

## 2024-04-13 ENCOUNTER — Other Ambulatory Visit (HOSPITAL_COMMUNITY): Payer: Self-pay

## 2024-05-03 NOTE — Progress Notes (Unsigned)
 NEUROLOGY FOLLOW UP OFFICE NOTE  RONIYAH LLORENS [id number]  Assessment/Plan:   Migraine without aura, with status migrainosus, intractable - improved frequency but still intractable Dizziness - now describes more of a lightheadedness.  Mild orthostatic hypotension is a possibility.   Right occipital neuralgia, resolved at this time.   Migraine prevention:  Aimovig  140mg .  Venlafaxine XR 225mg  daily for anxiety (prescribed by OBGYN for menopausal symptoms)  Migraine rescue:  Ubrelvy  100mg .  Zofran  ODT 4mg  for nausea.   Limit use of pain relievers to no more than 9 days out of the month to prevent risk of rebound or medication-overuse headache. Keep headache diary Follow up 1 year  Subjective:  CAILEIGH CANCHE is a 53 year old female with HTN, IBS, depression and anxiety who follows up for migraines and vertigo.  UPDATE: Migraine headaches remain controlled on Aimovig . Duration:  2-3 hours with Ubrelvy  Frequency:  2 to 4 a month.  Still with the dizziness but infrequent.  Due to ongoing dizziness with with associated nasal congestion, aural fullness/popping and tinnitus, she was referred to ENT to evaluate for possible eustachian tube dysfunction.  She never went but she no longer gets the popping and the dizziness is now described as lightheadedness and usually if she gets up too fast.   Current NSAIDS/analgesics:  ibuprofen  Current triptans:  none Current ergotamine:  none Current anti-emetic:  Zofran -ODT 4mg , promethazine  25mg  Current muscle relaxants:  tizanidine 4mg  PRN Current Antihypertensive medications:  none Current Antidepressant medications:  amitriptyline  25mg  QHS, venlafaxine XR 150mg  daily Current Anticonvulsant medications:  none Current anti-CGRP:  Aimovig  140mg , Ubrelvy  100mg  Current Vitamins/Herbal/Supplements:  MVI Current Antihistamines/Decongestants:  Flonase , Xyzal  Other therapy:  none Birth control:  none Other medications:  Zepbound , Ambien   PRN  HISTORY:  Dizziness/migraines: She had been feeling lightheaded beginning in late 2023.  On the morning of 10/03/2022, she woke up that morning and started feeling dizzy once she stood up.  Describes a spinning sensation.  She was seen in the ED where they thought it was related to dehydration and recent increase in Zepbound .  Dizziness persisted.  A spinning sensation triggered by movement or change in position, usually to the right, and lasting a minute.  For quite some time, she reports right sided ear pain and aural fullness.     At the same time as onset of dizziness, she began experiencing daily migraines.  She has a history of migraines that occur infrequently.  She started experiencing a pressure across the forehead, between her eyes or in the temples.  Sometimes notes a zapping pain around her head.  Associated with nausea, photophobia, phonophobia and occasional blurred vision. Lasts 12 to 24 hours with rizatriptan.  Had been occurring daily.-   MRI of brain with and without contrast on 10/15/2022 personally reviewed and revealed somewhat tortuous optic nerves but otherwise unremarkable.    She was prescribed prednisone  and amoxicillin  for possible sinusitis.  Due to results of MRI suggesting possible increased intracranial pressure, she was started on furosemide by her PCP.  She thinks it has helped.  Noted some blurred vision over the past year.  Denies visual obscurations or pulsatile tinnitus.  For vertigo, she has been performing the Epley maneuver at home.  She saw optometrist, Dr. Delories at [facility], on 10/27/2022 whose exam revealed no evidence of active or prior papilledema.  For dizziness, she was previously treated with furosemide and diazepam which helped.  When she ran out, the symptoms returned in  2025.  The dizziness was now mostly described as lightheadedness, not really experiencing the spinning anymore.  She notices it when getting up too fast but also it tends  to occur with change in weather.  She feels upper respiratory congestion, ear ears feel clogged and pops and she has ringing in both ears.  Feels off balance if she is walking.  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  This went on for 3 days.  Scalp may feel sore but no paresthesias.  Feels some neck pain with migraines but otherwise no neck pain   Past NSAIDS/analgesics:  naproxen, tramadol  Past abortive triptans:  rizatriptan, sumatriptan  tab Past abortive ergotamine:  none Past muscle relaxants:  none Past anti-emetic:  none Past antihypertensive medications:  none Past antidepressant medications:  sertraline , paroxetine Past anticonvulsant medications:  topiramate  Past anti-CGRP:  none Past vitamins/Herbal/Supplements:  none Past antihistamines/decongestants:  meclizine Other past therapies:  trigger point injections, nerve blocks, Valium 2mg  Q6h PRN (vertigo)  PAST MEDICAL HISTORY: Past Medical History:  Diagnosis Date   Allergy    Anemia menometrorrhaghia   history   Anginal pain    diagnosed in childhood, continues oocasionally duing adulthood   Anxiety    no meds   Arthritis    lower back and knees   Bilateral knee pain    no current prob - only bothers her when it rains   Complication of anesthesia    woke up coughing after surgery May 2014 - required inhaler in recovery and for a week after that surgery--pt thinks it was Symbicort    Depression    Fibroids    Gallstones    causing nausea and and midchest -abdominal pain   GERD (gastroesophageal reflux disease)    History - resolved treatment with diet - no meds   Herpes genitalis in women    History of chlamydia    Hypertension    IBS (irritable bowel syndrome)     diet controlled - no meds, no current prob   Insomnia    Migraines    topamax    Mild obstructive sleep apnea 05/22/2023   MRSA (methicillin resistant staph aureus) culture positive    skin infection7/2013   PT STATES NO INFECTION AT PRESENT AND SHE IS FOLLOWING HER DOCTOR'S ORDERS TO USE MUPIROCIN   OINTMENT BID IN NOSE FIRST 5 DAYS OF EACH MONTH THRU DEC 2014 AND HAS HIBICLENS  TO WASH THE AREAS WHERE THE INFECTION HAS BEEN.   PMS (premenstrual syndrome)    Pneumonia    Sarcoidosis    recent dx 11/2017- was hospitalized-tx w/ prednisone    Shortness of breath    with exertion     Thyromegaly    Vitamin D deficiency     MEDICATIONS: Current Outpatient Medications on File Prior to Visit  Medication Sig Dispense Refill   albuterol  (VENTOLIN  HFA) 108 (90 Base) MCG/ACT inhaler Inhale 2 puffs into the lungs every 6 (six) hours as needed for wheezing or shortness of breath. 8 g 6   amitriptyline  (ELAVIL ) 25 MG tablet Take 25 mg by mouth at bedtime.     azelastine (ASTELIN) 0.1 % nasal spray Place into both nostrils 2 (two) times daily. Use in each nostril as directed     budesonide -formoterol  (SYMBICORT ) 160-4.5 MCG/ACT  inhaler Inhale 2 puffs into the lungs 2 (two) times daily. 1 each 11   Erenumab -aooe (AIMOVIG ) 140 MG/ML SOAJ Inject 140 mg into the skin every 28 (twenty-eight) days. 1.12 mL 11   fexofenadine-pseudoephedrine (ALLEGRA-D 24) 180-240 MG 24 hr tablet Take 1 tablet by mouth daily.     fluconazole  (DIFLUCAN ) 150 MG tablet Take 150 mg by mouth as needed. (Patient not taking: Reported on 09/10/2023)     fluticasone  (FLONASE ) 50 MCG/ACT nasal spray Place 2 sprays into both nostrils daily.     levocetirizine (XYZAL ) 5 MG tablet Take 1 tablet (5 mg total) by mouth every evening. (Patient not taking: Reported on 10/21/2023) 30 tablet 5   montelukast  (SINGULAIR ) 10 MG tablet Take 1 tablet (10 mg total) by mouth at bedtime. 90 tablet 3   ondansetron  (ZOFRAN ) 4 MG tablet Take 1 tablet (4 mg  total) by mouth every 6 (six) hours. (Patient not taking: Reported on 10/21/2023) 20 tablet 0   ondansetron  (ZOFRAN -ODT) 4 MG disintegrating tablet Take 1 tablet (4 mg total) by mouth every 8 (eight) hours as needed for nausea or vomiting. 20 tablet 5   oseltamivir (TAMIFLU) 75 MG capsule Take 75 mg by mouth 2 (two) times daily.     promethazine  (PHENERGAN ) 25 MG tablet Take 25 mg by mouth every 6 (six) hours as needed for nausea or vomiting.     SUMAtriptan  (IMITREX ) 100 MG tablet Take 1 tablet (100 mg total) by mouth as needed for migraine. May repeat in 2 hours if headache persists or recurs.  Maximum 2 tablets in 24 hours. 30 tablet 2   tirzepatide  (ZEPBOUND ) 15 MG/0.5ML Pen Inject 15 mg into the skin once a week. 2 mL 11   tirzepatide  (ZEPBOUND ) 15 MG/0.5ML Pen Inject 15 mg into the skin once a week. 6 mL 0   tirzepatide  (ZEPBOUND ) 15 MG/0.5ML Pen Inject 15 mg into the skin once a week. 6 mL 0   tiZANidine (ZANAFLEX) 4 MG tablet Take 2 mg by mouth every 8 (eight) hours as needed for migraine.  5   Ubrogepant  (UBRELVY ) 100 MG TABS Take 1 tablet (100 mg total) by mouth as needed. May repeat after 2 hours.  Maximum 2 tablets in 24 hours. 16 tablet 11   valACYclovir (VALTREX) 500 MG tablet Take 500 mg by mouth as needed.     venlafaxine XR (EFFEXOR-XR) 150 MG 24 hr capsule Take 150 mg by mouth daily.     zolpidem  (AMBIEN ) 5 MG tablet Take 1 tablet (5 mg total) by mouth at bedtime as needed for sleep. Do not drive or operate heavy machinery after taking 30 tablet 5   No current facility-administered medications on file prior to visit.    ALLERGIES: Allergies  Allergen Reactions   Other Anaphylaxis    Spicy peppers - anything hot or spicy causes swelling and skin inflammation Cilantro- throat closes, trouble breathing   Pineapple Anaphylaxis    Throat closes, tongue swells   Latex Other (See Comments) and Rash    *Adhesive Tape  Discolors skin  Discolors skin    Adhesive [Tape] Other (See  Comments)    Causes skin discoloration.   Erythromycin Nausea And Vomiting    Other reaction(s): vomiting, Can Take Zpak   Iron     Stomach pain. Pt can take Integra for iron    FAMILY HISTORY: Family History  Problem Relation Age of Onset   Anemia Mother    Hypertension Mother    Asthma Mother  Allergies Mother    Migraines Sister    Ulcers Sister    Migraines Sister    Allergies Sister    Migraines Maternal Aunt    Asthma Maternal Aunt    Migraines Maternal Aunt    Allergies Maternal Aunt    Breast cancer Paternal Aunt    Cancer Maternal Grandmother        colon   Lung cancer Cousin       Objective:  Blood pressure 101/68, pulse (!) 110, height 5' 5 (1.651 m), weight 186 lb 3.2 oz (84.5 kg), SpO2 98%. General: No acute distress.  Patient appears well-groomed.   Head:  Normocephalic/atraumatic Eyes:  Fundi examined but not visualized Neck: supple, no paraspinal tenderness, full range of motion Heart:  Regular rate and rhythm Neurological Exam: alert and oriented.  Speech fluent and not dysarthric, language intact.  CN II-XII intact. Bulk and tone normal, muscle strength 5/5 throughout.  Sensation to light touch intact.  Deep tendon reflexes 2+ throughout, toes downgoing.  Finger to nose testing intact.  Gait normal, Romberg negative.   Juliene Dunnings, DO  CC:  Marisa Dameron, DO

## 2024-05-04 ENCOUNTER — Encounter: Payer: Self-pay | Admitting: Neurology

## 2024-05-04 ENCOUNTER — Ambulatory Visit (INDEPENDENT_AMBULATORY_CARE_PROVIDER_SITE_OTHER): Admitting: Neurology

## 2024-05-04 VITALS — BP 101/68 | HR 110 | Ht 65.0 in | Wt 186.2 lb

## 2024-05-04 DIAGNOSIS — R42 Dizziness and giddiness: Secondary | ICD-10-CM

## 2024-05-04 DIAGNOSIS — G43009 Migraine without aura, not intractable, without status migrainosus: Secondary | ICD-10-CM | POA: Diagnosis not present

## 2024-05-04 MED ORDER — AIMOVIG 140 MG/ML ~~LOC~~ SOAJ: 140.0000 mg | SUBCUTANEOUS | 11 refills | Status: DC

## 2024-05-04 MED ORDER — UBRELVY 100 MG PO TABS: 1.0000 | ORAL_TABLET | ORAL | 11 refills | Status: AC | PRN

## 2024-05-04 MED ORDER — AIMOVIG 140 MG/ML ~~LOC~~ SOAJ: 140.0000 mg | SUBCUTANEOUS | 11 refills | Status: AC

## 2024-05-04 NOTE — Patient Instructions (Signed)
 Aimovig   Ubrelvy , zofran 

## 2024-06-06 ENCOUNTER — Other Ambulatory Visit (HOSPITAL_COMMUNITY): Payer: Self-pay

## 2024-06-06 ENCOUNTER — Telehealth: Payer: Self-pay

## 2024-06-06 NOTE — Telephone Encounter (Signed)
 Pharmacy Patient Advocate Encounter   Received notification from CoverMyMeds that prior authorization for Ubrelvy  100MG  tablets is required/requested.   Insurance verification completed.   The patient is insured through [employer].   Prior Authorization for Ubrelvy  100MG  tablets has been APPROVED from 05-07-2024 to 06-06-2025. Ran test claim, Copay is $0.00. Insurance pays MAXIMUM 10 tablets per 30 days. This test claim was processed through [facility]- copay amounts may vary at other pharmacies due to pharmacy/plan contracts, or as the patient moves through the different stages of their insurance plan.   PA #/Case ID/Reference #: [id number]

## 2024-06-17 ENCOUNTER — Other Ambulatory Visit (HOSPITAL_COMMUNITY): Payer: Self-pay

## 2024-06-20 ENCOUNTER — Other Ambulatory Visit (HOSPITAL_COMMUNITY): Payer: Self-pay

## 2024-06-20 MED ORDER — ZEPBOUND 15 MG/0.5ML ~~LOC~~ SOAJ
15.0000 mg | SUBCUTANEOUS | 3 refills | Status: AC
  Filled 2024-06-20: qty 2, 28d supply, fill #0
  Filled 2024-07-16: qty 2, 28d supply, fill #1

## 2025-05-05 ENCOUNTER — Ambulatory Visit: Admitting: Neurology
